# Patient Record
Sex: Male | Born: 1955
Health system: Southern US, Community
[De-identification: ages and names within clinical notes are randomized; demographics above are authoritative.]

## PROBLEM LIST (undated history)

## (undated) DIAGNOSIS — E785 Hyperlipidemia, unspecified: Secondary | ICD-10-CM

## (undated) DIAGNOSIS — I10 Essential (primary) hypertension: Secondary | ICD-10-CM

## (undated) DIAGNOSIS — G4700 Insomnia, unspecified: Secondary | ICD-10-CM

## (undated) DIAGNOSIS — K5900 Constipation, unspecified: Secondary | ICD-10-CM

## (undated) DIAGNOSIS — E119 Type 2 diabetes mellitus without complications: Secondary | ICD-10-CM

## (undated) DIAGNOSIS — K219 Gastro-esophageal reflux disease without esophagitis: Secondary | ICD-10-CM

## (undated) DIAGNOSIS — I480 Paroxysmal atrial fibrillation: Secondary | ICD-10-CM

## (undated) DIAGNOSIS — M1990 Unspecified osteoarthritis, unspecified site: Secondary | ICD-10-CM

## (undated) DIAGNOSIS — C801 Malignant (primary) neoplasm, unspecified: Secondary | ICD-10-CM

## (undated) DIAGNOSIS — I499 Cardiac arrhythmia, unspecified: Secondary | ICD-10-CM

## (undated) DIAGNOSIS — M109 Gout, unspecified: Secondary | ICD-10-CM

## (undated) HISTORY — DX: Malignant (primary) neoplasm, unspecified: C80.1

## (undated) HISTORY — DX: Unspecified osteoarthritis, unspecified site: M19.90

## (undated) HISTORY — PX: BACK SURGERY: SHX140

## (undated) HISTORY — PX: KNEE ARTHROSCOPY: SUR90

## (undated) HISTORY — DX: Gout, unspecified: M10.9

## (undated) HISTORY — PX: KNEE SURGERY: SHX244

## (undated) HISTORY — DX: Constipation, unspecified: K59.00

## (undated) HISTORY — DX: Insomnia, unspecified: G47.00

## (undated) HISTORY — PX: CARPAL TUNNEL RELEASE: SHX101

## (undated) HISTORY — DX: Hyperlipidemia, unspecified: E78.5

## (undated) HISTORY — DX: Paroxysmal atrial fibrillation: I48.0

## (undated) HISTORY — DX: Essential (primary) hypertension: I10

## (undated) HISTORY — DX: Gastro-esophageal reflux disease without esophagitis: K21.9

## (undated) HISTORY — DX: Type 2 diabetes mellitus without complications: E11.9

---

## 1999-03-03 ENCOUNTER — Emergency Department (HOSPITAL_COMMUNITY): Admission: EM | Admit: 1999-03-03 | Discharge: 1999-03-03 | Payer: Self-pay | Admitting: Emergency Medicine

## 1999-11-21 ENCOUNTER — Encounter: Payer: Self-pay | Admitting: Emergency Medicine

## 1999-11-21 ENCOUNTER — Emergency Department (HOSPITAL_COMMUNITY): Admission: EM | Admit: 1999-11-21 | Discharge: 1999-11-21 | Payer: Self-pay | Admitting: Emergency Medicine

## 2002-10-12 ENCOUNTER — Encounter: Admission: RE | Admit: 2002-10-12 | Discharge: 2002-10-12 | Payer: Self-pay | Admitting: Internal Medicine

## 2002-12-21 ENCOUNTER — Encounter: Admission: RE | Admit: 2002-12-21 | Discharge: 2002-12-21 | Payer: Self-pay | Admitting: Internal Medicine

## 2003-05-06 ENCOUNTER — Encounter: Admission: RE | Admit: 2003-05-06 | Discharge: 2003-05-06 | Payer: Self-pay | Admitting: Internal Medicine

## 2003-05-25 ENCOUNTER — Encounter: Admission: RE | Admit: 2003-05-25 | Discharge: 2003-05-25 | Payer: Self-pay | Admitting: Internal Medicine

## 2003-06-11 ENCOUNTER — Encounter: Admission: RE | Admit: 2003-06-11 | Discharge: 2003-06-11 | Payer: Self-pay | Admitting: Internal Medicine

## 2003-06-11 ENCOUNTER — Encounter: Payer: Self-pay | Admitting: Internal Medicine

## 2003-06-11 ENCOUNTER — Ambulatory Visit (HOSPITAL_COMMUNITY): Admission: RE | Admit: 2003-06-11 | Discharge: 2003-06-11 | Payer: Self-pay | Admitting: Internal Medicine

## 2003-07-12 ENCOUNTER — Encounter: Admission: RE | Admit: 2003-07-12 | Discharge: 2003-07-12 | Payer: Self-pay | Admitting: Infectious Diseases

## 2003-09-29 ENCOUNTER — Encounter: Admission: RE | Admit: 2003-09-29 | Discharge: 2003-09-29 | Payer: Self-pay | Admitting: Internal Medicine

## 2003-12-21 ENCOUNTER — Encounter: Admission: RE | Admit: 2003-12-21 | Discharge: 2003-12-21 | Payer: Self-pay | Admitting: Internal Medicine

## 2004-04-03 ENCOUNTER — Encounter: Admission: RE | Admit: 2004-04-03 | Discharge: 2004-04-03 | Payer: Self-pay | Admitting: Internal Medicine

## 2004-05-02 ENCOUNTER — Encounter: Admission: RE | Admit: 2004-05-02 | Discharge: 2004-05-02 | Payer: Self-pay | Admitting: Internal Medicine

## 2004-05-17 ENCOUNTER — Encounter: Admission: RE | Admit: 2004-05-17 | Discharge: 2004-05-17 | Payer: Self-pay | Admitting: Internal Medicine

## 2004-05-31 ENCOUNTER — Encounter: Admission: RE | Admit: 2004-05-31 | Discharge: 2004-05-31 | Payer: Self-pay | Admitting: Internal Medicine

## 2004-06-15 ENCOUNTER — Encounter: Admission: RE | Admit: 2004-06-15 | Discharge: 2004-06-15 | Payer: Self-pay | Admitting: Internal Medicine

## 2004-06-29 ENCOUNTER — Encounter: Admission: RE | Admit: 2004-06-29 | Discharge: 2004-06-29 | Payer: Self-pay | Admitting: Internal Medicine

## 2004-09-18 ENCOUNTER — Ambulatory Visit: Payer: Self-pay | Admitting: Internal Medicine

## 2004-10-04 ENCOUNTER — Ambulatory Visit: Payer: Self-pay | Admitting: Internal Medicine

## 2004-10-23 ENCOUNTER — Ambulatory Visit (HOSPITAL_COMMUNITY): Admission: RE | Admit: 2004-10-23 | Discharge: 2004-10-23 | Payer: Self-pay | Admitting: Internal Medicine

## 2004-10-23 ENCOUNTER — Ambulatory Visit: Payer: Self-pay | Admitting: Internal Medicine

## 2004-11-10 ENCOUNTER — Ambulatory Visit (HOSPITAL_COMMUNITY): Admission: RE | Admit: 2004-11-10 | Discharge: 2004-11-10 | Payer: Self-pay | Admitting: Internal Medicine

## 2004-11-10 ENCOUNTER — Ambulatory Visit: Payer: Self-pay | Admitting: Internal Medicine

## 2004-11-15 ENCOUNTER — Ambulatory Visit (HOSPITAL_COMMUNITY): Admission: RE | Admit: 2004-11-15 | Discharge: 2004-11-15 | Payer: Self-pay | Admitting: Internal Medicine

## 2004-12-19 ENCOUNTER — Ambulatory Visit: Payer: Self-pay | Admitting: Internal Medicine

## 2005-01-02 ENCOUNTER — Ambulatory Visit: Payer: Self-pay | Admitting: Internal Medicine

## 2005-06-27 ENCOUNTER — Ambulatory Visit: Payer: Self-pay | Admitting: Internal Medicine

## 2005-07-03 ENCOUNTER — Ambulatory Visit (HOSPITAL_COMMUNITY): Admission: RE | Admit: 2005-07-03 | Discharge: 2005-07-03 | Payer: Self-pay | Admitting: Internal Medicine

## 2005-11-15 ENCOUNTER — Ambulatory Visit: Payer: Self-pay | Admitting: Internal Medicine

## 2006-11-23 DIAGNOSIS — F17200 Nicotine dependence, unspecified, uncomplicated: Secondary | ICD-10-CM | POA: Insufficient documentation

## 2006-11-23 DIAGNOSIS — I10 Essential (primary) hypertension: Secondary | ICD-10-CM

## 2006-11-23 DIAGNOSIS — F1010 Alcohol abuse, uncomplicated: Secondary | ICD-10-CM | POA: Insufficient documentation

## 2006-11-23 DIAGNOSIS — M109 Gout, unspecified: Secondary | ICD-10-CM | POA: Insufficient documentation

## 2006-11-23 HISTORY — DX: Essential (primary) hypertension: I10

## 2006-12-04 DIAGNOSIS — R599 Enlarged lymph nodes, unspecified: Secondary | ICD-10-CM | POA: Insufficient documentation

## 2006-12-04 DIAGNOSIS — Z9889 Other specified postprocedural states: Secondary | ICD-10-CM | POA: Insufficient documentation

## 2006-12-04 DIAGNOSIS — E785 Hyperlipidemia, unspecified: Secondary | ICD-10-CM | POA: Insufficient documentation

## 2007-08-25 ENCOUNTER — Emergency Department (HOSPITAL_COMMUNITY): Admission: EM | Admit: 2007-08-25 | Discharge: 2007-08-26 | Payer: Self-pay | Admitting: Emergency Medicine

## 2008-04-02 ENCOUNTER — Ambulatory Visit (HOSPITAL_COMMUNITY): Admission: RE | Admit: 2008-04-02 | Discharge: 2008-04-02 | Payer: Self-pay | Admitting: Cardiology

## 2010-02-14 ENCOUNTER — Encounter: Admission: RE | Admit: 2010-02-14 | Discharge: 2010-02-14 | Payer: Self-pay | Admitting: Family Medicine

## 2011-04-06 ENCOUNTER — Emergency Department (HOSPITAL_COMMUNITY): Payer: Medicare Other

## 2011-04-06 ENCOUNTER — Emergency Department (HOSPITAL_COMMUNITY)
Admission: EM | Admit: 2011-04-06 | Discharge: 2011-04-06 | Disposition: A | Discharge status: Home / Self Care | Payer: Medicare Other | Attending: Emergency Medicine | Admitting: Emergency Medicine

## 2011-04-06 DIAGNOSIS — R6889 Other general symptoms and signs: Secondary | ICD-10-CM | POA: Insufficient documentation

## 2011-04-06 DIAGNOSIS — T783XXA Angioneurotic edema, initial encounter: Secondary | ICD-10-CM | POA: Insufficient documentation

## 2011-04-06 DIAGNOSIS — T46905A Adverse effect of unspecified agents primarily affecting the cardiovascular system, initial encounter: Secondary | ICD-10-CM | POA: Insufficient documentation

## 2011-04-06 DIAGNOSIS — I10 Essential (primary) hypertension: Secondary | ICD-10-CM | POA: Insufficient documentation

## 2011-04-06 DIAGNOSIS — R1310 Dysphagia, unspecified: Secondary | ICD-10-CM | POA: Insufficient documentation

## 2011-06-28 ENCOUNTER — Emergency Department (HOSPITAL_COMMUNITY)
Admission: EM | Admit: 2011-06-28 | Discharge: 2011-06-28 | Discharge status: Against Medical Advice | Payer: Medicare Other | Attending: Emergency Medicine | Admitting: Emergency Medicine

## 2011-06-28 ENCOUNTER — Emergency Department (HOSPITAL_COMMUNITY): Payer: Medicare Other

## 2011-06-28 DIAGNOSIS — S92919A Unspecified fracture of unspecified toe(s), initial encounter for closed fracture: Secondary | ICD-10-CM | POA: Insufficient documentation

## 2011-06-28 DIAGNOSIS — IMO0002 Reserved for concepts with insufficient information to code with codable children: Secondary | ICD-10-CM | POA: Insufficient documentation

## 2011-06-28 DIAGNOSIS — M25529 Pain in unspecified elbow: Secondary | ICD-10-CM | POA: Insufficient documentation

## 2011-06-28 DIAGNOSIS — M25569 Pain in unspecified knee: Secondary | ICD-10-CM | POA: Insufficient documentation

## 2011-06-28 DIAGNOSIS — I10 Essential (primary) hypertension: Secondary | ICD-10-CM | POA: Insufficient documentation

## 2011-06-28 DIAGNOSIS — E785 Hyperlipidemia, unspecified: Secondary | ICD-10-CM | POA: Insufficient documentation

## 2011-06-28 DIAGNOSIS — M549 Dorsalgia, unspecified: Secondary | ICD-10-CM | POA: Insufficient documentation

## 2011-06-28 DIAGNOSIS — M79609 Pain in unspecified limb: Secondary | ICD-10-CM | POA: Insufficient documentation

## 2011-10-17 ENCOUNTER — Ambulatory Visit: Payer: Medicare Other | Admitting: *Deleted

## 2012-02-19 DIAGNOSIS — E785 Hyperlipidemia, unspecified: Secondary | ICD-10-CM | POA: Diagnosis not present

## 2012-02-19 DIAGNOSIS — I10 Essential (primary) hypertension: Secondary | ICD-10-CM | POA: Diagnosis not present

## 2012-02-19 DIAGNOSIS — E119 Type 2 diabetes mellitus without complications: Secondary | ICD-10-CM | POA: Diagnosis not present

## 2012-04-07 DIAGNOSIS — Z79899 Other long term (current) drug therapy: Secondary | ICD-10-CM | POA: Diagnosis not present

## 2012-04-30 DIAGNOSIS — Z79899 Other long term (current) drug therapy: Secondary | ICD-10-CM | POA: Diagnosis not present

## 2012-07-18 DIAGNOSIS — E785 Hyperlipidemia, unspecified: Secondary | ICD-10-CM | POA: Diagnosis not present

## 2012-07-18 DIAGNOSIS — I10 Essential (primary) hypertension: Secondary | ICD-10-CM | POA: Diagnosis not present

## 2012-07-18 DIAGNOSIS — E119 Type 2 diabetes mellitus without complications: Secondary | ICD-10-CM | POA: Diagnosis not present

## 2012-08-21 DIAGNOSIS — E119 Type 2 diabetes mellitus without complications: Secondary | ICD-10-CM | POA: Diagnosis not present

## 2012-08-21 DIAGNOSIS — I10 Essential (primary) hypertension: Secondary | ICD-10-CM | POA: Diagnosis not present

## 2012-08-21 DIAGNOSIS — R634 Abnormal weight loss: Secondary | ICD-10-CM | POA: Diagnosis not present

## 2012-08-21 DIAGNOSIS — E785 Hyperlipidemia, unspecified: Secondary | ICD-10-CM | POA: Diagnosis not present

## 2012-09-19 DIAGNOSIS — R109 Unspecified abdominal pain: Secondary | ICD-10-CM | POA: Diagnosis not present

## 2012-09-19 DIAGNOSIS — Z23 Encounter for immunization: Secondary | ICD-10-CM | POA: Diagnosis not present

## 2012-09-19 DIAGNOSIS — E119 Type 2 diabetes mellitus without complications: Secondary | ICD-10-CM | POA: Diagnosis not present

## 2012-09-19 DIAGNOSIS — E785 Hyperlipidemia, unspecified: Secondary | ICD-10-CM | POA: Diagnosis not present

## 2012-09-22 DIAGNOSIS — Z800 Family history of malignant neoplasm of digestive organs: Secondary | ICD-10-CM | POA: Diagnosis not present

## 2012-09-22 DIAGNOSIS — K625 Hemorrhage of anus and rectum: Secondary | ICD-10-CM | POA: Diagnosis not present

## 2012-09-22 DIAGNOSIS — Z1211 Encounter for screening for malignant neoplasm of colon: Secondary | ICD-10-CM | POA: Diagnosis not present

## 2012-09-22 DIAGNOSIS — K5900 Constipation, unspecified: Secondary | ICD-10-CM | POA: Diagnosis not present

## 2012-10-02 DIAGNOSIS — D129 Benign neoplasm of anus and anal canal: Secondary | ICD-10-CM | POA: Diagnosis not present

## 2012-10-02 DIAGNOSIS — C184 Malignant neoplasm of transverse colon: Secondary | ICD-10-CM | POA: Diagnosis not present

## 2012-10-02 DIAGNOSIS — D126 Benign neoplasm of colon, unspecified: Secondary | ICD-10-CM | POA: Diagnosis not present

## 2012-10-02 DIAGNOSIS — D128 Benign neoplasm of rectum: Secondary | ICD-10-CM | POA: Diagnosis not present

## 2012-10-02 DIAGNOSIS — Z1211 Encounter for screening for malignant neoplasm of colon: Secondary | ICD-10-CM | POA: Diagnosis not present

## 2012-10-02 DIAGNOSIS — K649 Unspecified hemorrhoids: Secondary | ICD-10-CM | POA: Diagnosis not present

## 2012-10-03 DIAGNOSIS — C801 Malignant (primary) neoplasm, unspecified: Secondary | ICD-10-CM

## 2012-10-03 HISTORY — DX: Malignant (primary) neoplasm, unspecified: C80.1

## 2012-10-07 DIAGNOSIS — C189 Malignant neoplasm of colon, unspecified: Secondary | ICD-10-CM | POA: Diagnosis not present

## 2012-10-08 ENCOUNTER — Other Ambulatory Visit: Payer: Self-pay | Admitting: Gastroenterology

## 2012-10-08 DIAGNOSIS — C189 Malignant neoplasm of colon, unspecified: Secondary | ICD-10-CM

## 2012-10-13 ENCOUNTER — Ambulatory Visit
Admission: RE | Admit: 2012-10-13 | Discharge: 2012-10-13 | Disposition: A | Discharge status: Home / Self Care | Payer: Medicare Other | Source: Ambulatory Visit | Attending: Gastroenterology | Admitting: Gastroenterology

## 2012-10-13 DIAGNOSIS — R109 Unspecified abdominal pain: Secondary | ICD-10-CM | POA: Diagnosis not present

## 2012-10-13 DIAGNOSIS — C189 Malignant neoplasm of colon, unspecified: Secondary | ICD-10-CM

## 2012-10-13 DIAGNOSIS — K921 Melena: Secondary | ICD-10-CM | POA: Diagnosis not present

## 2012-10-13 DIAGNOSIS — K5900 Constipation, unspecified: Secondary | ICD-10-CM | POA: Diagnosis not present

## 2012-10-13 MED ORDER — IOHEXOL 300 MG/ML  SOLN
125.0000 mL | Freq: Once | INTRAMUSCULAR | Status: AC | PRN
  Administered 2012-10-13: 125 mL via INTRAVENOUS

## 2012-10-14 ENCOUNTER — Telehealth: Payer: Self-pay | Admitting: *Deleted

## 2012-10-14 NOTE — Telephone Encounter (Signed)
Received referral from Dr. Elnoria Howard.  Per Dr. Truett Perna, needs to see surgeon first.  Appointment set up with [facility], Dr. Maisie Fus, for 10/21/12.  This RN called and spoke with patient.  He confirmed this appointment and had no further questions.  This RN gave patient contact names and phone numbers for [facility] and [facility].  Dr. Truett Perna to follow patient and see after surgical consult.

## 2012-10-14 NOTE — Telephone Encounter (Signed)
Information from Dr.  Haywood Pao office was faxed to [facility].

## 2012-10-15 ENCOUNTER — Encounter (INDEPENDENT_AMBULATORY_CARE_PROVIDER_SITE_OTHER): Payer: Self-pay

## 2012-10-21 ENCOUNTER — Encounter (INDEPENDENT_AMBULATORY_CARE_PROVIDER_SITE_OTHER): Payer: Self-pay | Admitting: General Surgery

## 2012-10-21 ENCOUNTER — Telehealth: Payer: Self-pay | Admitting: Oncology

## 2012-10-21 ENCOUNTER — Ambulatory Visit (INDEPENDENT_AMBULATORY_CARE_PROVIDER_SITE_OTHER): Payer: Medicare Other | Admitting: General Surgery

## 2012-10-21 ENCOUNTER — Encounter (INDEPENDENT_AMBULATORY_CARE_PROVIDER_SITE_OTHER): Payer: Self-pay

## 2012-10-21 VITALS — BP 120/80 | HR 78 | Temp 97.1°F | Resp 18 | Ht 75.0 in | Wt 252.5 lb

## 2012-10-21 DIAGNOSIS — C189 Malignant neoplasm of colon, unspecified: Secondary | ICD-10-CM | POA: Insufficient documentation

## 2012-10-21 NOTE — Telephone Encounter (Signed)
S/W pt in re NP appt 11/21 @ 1:30 W/Dr. Truett Perna Referring-Dr. Elnoria Howard Dx- Colon CA Welcome packet given at registration.

## 2012-10-21 NOTE — Patient Instructions (Addendum)
Colorectal Cancer  Colorectal cancer is the second most common cancer in the [place], striking 140,000 people annually and causing 60,000 deaths. That's a staggering figure when you consider the disease is potentially curable if diagnosed in the early stages. Who is at risk? Though colorectal cancer may occur at any age, more than 90% of the patients are over age 54, at which point the risk doubles every ten years. In addition to age, other high risk factors include a family history of colorectal cancer and polyps and a personal history of ulcerative colitis, colon polyps or cancer of other organs, especially of the breast or uterus. How does it start? It is generally agreed that nearly all colon and rectal cancer begins in benign polyps. These pre-malignant growths occur on the bowel wall and may eventually increase in size and become cancer. Removal of benign polyps is one aspect of preventive medicine that really works! What are the symptoms? The most common symptoms are rectal bleeding and changes in bowel habits, such as constipation or diarrhea. (These symptoms are also common in other diseases so it is important you receive a thorough examination should you experience them.) Abdominal pain and weight loss are usually late symptoms indicating possible extensive disease. Unfortunately, many polyps and early cancers fail to produce symptoms. Therefore, it is important that your routine physical includes colorectal cancer detection procedures once you reach age 50.  There are several methods for detection of colorectal cancer. These include digital rectal examination, a chemical test of the stool for blood, flexible sigmoidoscopy and colonoscopy (lighted tubular instruments used to inspect the lower bowel) and barium enema. Be sure to discuss these options with your surgeon to determine which procedure is best for you. Individuals who have a first-degree relative (parent or sibling) with colon  cancer or polyps should start their colon cancer screening at the age of 54. How is colorectal cancer treated? Colorectal cancer requires surgery in nearly all cases for complete cure. Radiation and chemotherapy are sometimes used in addition to surgery. Between 80-90% are restored to normal health if the cancer is detected and treated in the earliest stages. The cure rate drops to 50% or less when diagnosed in the later stages. Thanks to modern technology, less than 5% of all colorectal cancer patients require a colostomy, the surgical construction of an artificial excretory opening from the colon. Can colon cancer be prevented? Colon cancer is very preventable. The most important step towards preventing colon cancer is getting a screening test. Any abnormal screening test should be followed by a colonoscopy. Some individuals prefer to start with colonoscopy as a screening test. Colonoscopy provides a detailed examination of the bowel. Polyps can be identified and can often be removed during colonoscopy. Though not definitely proven, there is some evidence that diet may play a significant role in preventing colorectal cancer. As far as we know, a high fiber, low fat diet is the only dietary measure that might help prevent colorectal cancer. Finally, pay attention to changes in your bowel habits. Any new changes such as persistent constipation, diarrhea, or blood in the stool should be discussed with your physician.   Can hemorrhoids lead to colon cancer? No, but hemorrhoids may produce symptoms similar to colon polyps or cancer. Should you experience these symptoms, you should have them examined and evaluated by a physician, preferably by a colon and rectal surgeon. What is a colon and rectal surgeon? Colon and rectal surgeons are experts in the surgical and non-surgical treatment   of diseases of the colon, rectum and anus. They have completed advanced surgical training in the treatment of these  diseases as well as full general surgical training. Board-certified colon and rectal surgeons complete residencies in general surgery and colon and rectal surgery, and pass intensive examinations conducted by the American Board of Surgery and the American Board of Colon and Rectal Surgery. They are well-versed in the treatment of both benign and malignant diseases of the colon, rectum and anus and are able to perform routine screening examinations and surgically treat conditions if indicated to do so.  2012 American Society of Colon & Rectal Surgeons  

## 2012-10-21 NOTE — Progress Notes (Signed)
Chief Complaint  Patient presents with  . Colon Cancer    HISTORY: Gary Frederick is a 56 y.o. male who presents to the office with a hepatic flexure mass.  This was found colonoscopy.  Workup thus far has included a CT scan of his abdomen.  He denies any weight loss or changes in appetite.  He does have some bright red blood per rectum.   He denies any chest pain or DOE.  He has a relatively active lifestyle.  He does smoke and he drinks 1-2 beers a day.  Past Medical History  Diagnosis Date  . Hyperlipidemia   . Hypertension   . Arthritis   . Gout   . Insomnia   . Constipation   . Cancer 10/2012    colon      Past Surgical History  Procedure Date  . Carpal tunnel release     right      Current Outpatient Prescriptions  Medication Sig Dispense Refill  . ALLOPURINOL PO Take 100 mg by mouth daily.       Marland Kitchen AMLODIPINE BESYLATE PO Take 10 mg by mouth daily.       . Ezetimibe (ZETIA PO) Take 10 mg by mouth daily.       Marland Kitchen LISINOPRIL PO Take 40 mg by mouth daily.       . Nebivolol HCl (BYSTOLIC PO) Take 10 mg by mouth daily.           No Known Allergies    family history strong h/o Huntington's disease, no h/o colon or rectal cancers  History   Social History  . Marital Status: Married    Spouse Name: N/A    Number of Children: N/A  . Years of Education: N/A   Social History Main Topics  . Smoking status: Current Every Day Smoker -- 1.0 packs/day for 30 years    Types: Cigarettes  . Smokeless tobacco: None  . Alcohol Use: 3.0 oz/week    5 Cans of beer per week  . Drug Use: No  . Sexually Active:    Other Topics Concern  . None   Social History Narrative  . None       REVIEW OF SYSTEMS - PERTINENT POSITIVES ONLY: Review of Systems - General ROS: negative for - chills or fever Hematological and Lymphatic ROS: negative for - bleeding problems or blood clots Respiratory ROS: no cough, shortness of breath, or wheezing Cardiovascular ROS: no chest pain or  dyspnea on exertion Gastrointestinal ROS: no abdominal pain, change in bowel habits, or black or bloody stools Genito-Urinary ROS: no dysuria, trouble voiding, or hematuria  EXAM: Filed Vitals:   10/21/12 1226  BP: 120/80  Pulse: 78  Temp: 97.1 F (36.2 C)  Resp: 18     Gen:  No acute distress.  Well nourished and well groomed.   Neurological: Alert and oriented to person, place, and time. Coordination normal.  Head: Normocephalic and atraumatic.  Eyes: Conjunctivae are normal. Pupils are equal, round, and reactive to light. No scleral icterus.  Neck: Normal range of motion. Neck supple. No tracheal deviation or thyromegaly present.  No cervical lymphadenopathy. Cardiovascular: Normal rate, regular rhythm, normal heart sounds and intact distal pulses.  Exam reveals no gallop and no friction rub.  No murmur heard. Respiratory: Effort normal.  No respiratory distress. No chest wall tenderness. Breath sounds normal.  No wheezes, rales or rhonchi.  GI: Soft. Bowel sounds are normal. The abdomen is soft and nontender.  There  is no rebound and no guarding.  Musculoskeletal: Normal range of motion. Extremities are nontender.  Skin: Skin is warm and dry. No rash noted. No diaphoresis. No erythema. No pallor. No clubbing, cyanosis, or edema.   Psychiatric: Normal mood and affect. Behavior is normal. Judgment and thought content normal.   LABORATORY RESULTS: Available labs are reviewed   CEA: Pending  RADIOLOGY RESULTS:   Images and reports are reviewed. IMPRESSION:  Suspected eccentric wall thickening along the inferior aspect of the proximal transverse colon, likely corresponding to the known ransverse colon cancer.  No pericolonic extension. No suspicious regional lymphadenopathy.  Possible 1.8 cm hypoenhancing lesion in the inferior right hepatic lobe, equivocal. Consider MRI abdomen with/without contrast for further evaluation.   ASSESSMENT AND PLAN: Gary Frederick is a 56  y.o. M who was referred to me for a new finding of a biopsy proven transverse colon cancer.  On CT it appears to be near the hepatic flexure.  It has been tattooed.  There is one indeterminate lesion in his liver on CT.  I have ordered a chest CT to complete his pre-op workup.  I will present him in GI tumor conference next Wednesday to discuss a plan for his liver lesion.  I will schedule him for a laparoscopic right hemicolectomy.    The surgery and anatomy were described to the patient as well as the risks of surgery and the possible complications.  These include: Bleeding, infection and possible wound complications such as hernia, damage to adjacent structures, leak of surgical connections, which can lead to other surgeries and possibly an ostomy (5-7%), possible need for other procedures, such as abscess drains in radiology, possible prolonged hospital stay, possible diarrhea from removal of part of the colon, possible constipation from narcotics, prolonged fatigue/weakness or appetite loss, possible early recurrence of cancer, possible complications of their medical problems such as heart disease or arrhythmias or lung problems, death (less than 1%).  I talked to him about increase risk of wound and cardiopulmonary complications with smoking.  I urged him to quit smoking or at least cut back prior to surgery. I believe the patient understands and wishes to proceed with the surgery.    Vanita Panda, MD Colon and Rectal Surgery / General Surgery [facility]      Visit Diagnoses: 1. Colon cancer     Primary Care Physician: Evlyn Courier, MD

## 2012-10-22 ENCOUNTER — Telehealth: Payer: Self-pay | Admitting: Oncology

## 2012-10-22 NOTE — Telephone Encounter (Signed)
C/D 10/22/12 for appt.10/23/12

## 2012-10-23 ENCOUNTER — Ambulatory Visit (HOSPITAL_BASED_OUTPATIENT_CLINIC_OR_DEPARTMENT_OTHER): Payer: Medicare Other | Admitting: Oncology

## 2012-10-23 ENCOUNTER — Encounter: Payer: Self-pay | Admitting: Oncology

## 2012-10-23 ENCOUNTER — Ambulatory Visit: Payer: Medicare Other

## 2012-10-23 ENCOUNTER — Telehealth: Payer: Self-pay | Admitting: Oncology

## 2012-10-23 VITALS — BP 123/73 | HR 64 | Temp 98.0°F | Resp 20 | Ht 75.0 in | Wt 255.0 lb

## 2012-10-23 DIAGNOSIS — K7689 Other specified diseases of liver: Secondary | ICD-10-CM

## 2012-10-23 DIAGNOSIS — C189 Malignant neoplasm of colon, unspecified: Secondary | ICD-10-CM

## 2012-10-23 DIAGNOSIS — C184 Malignant neoplasm of transverse colon: Secondary | ICD-10-CM | POA: Diagnosis not present

## 2012-10-23 NOTE — Telephone Encounter (Signed)
Gave pt appt for January 2014 lab and MD °

## 2012-10-23 NOTE — Progress Notes (Signed)
Checked in new pt with no financial concerns. °

## 2012-10-23 NOTE — Progress Notes (Signed)
[facility] New Patient Consult   Referring MD: Gary Frederick 56 y.o.  02-02-1956    Reason for Referral: Colon cancer     HPI: Gary Frederick saw Dr. Elnoria Howard up for a screening colonoscopy on 10/02/2012. Multiple polyps were identified throughout the colon with the exception of the sigmoid colon and rectum. The polyps were removed with a combination of cold and hot snare. A 3 cm mass was noted in the transverse colon. The mass was biopsied and the site was tattooed. The pathology revealed tubular adenomas and benign polypoid colonic mucosa from the cecum biopsies. A biopsy from the a sending colon revealed fragments of a tubular adenoma. The biopsy of the transverse colon mass revealed invasive adenocarcinoma, moderately differentiated. Immunohistochemical stains for mismatch repair genes revealed no evidence of DNA mismatch repair deficiency.  Gary Frederick feels well. Gary Frederick had abdominal pain when taking Lovaza for hypercholesterolemia. Abdominal pain and constipation resolved 20 stopped this medication. Gary Frederick has noted intermittent blood in the stool for the past year.  A CT of the abdomen and pelvis on 10/13/2012 revealed clear lung bases, a 1.8 cm hypoenhancing lesion in the inferior right hepatic lobe was indeterminate, asymmetric wall thickening at the inferior aspect of the proximal transverse colon-no suspicious regional lymphadenopathy. Nonspecific lymphatic stranding in the jejunal mesentery with small nodes measuring up to 7 mm.  Past medical history: 1. Hypertension 2. Hyperlipidemia 3. Gout  Past Surgical History  Procedure Date  . Carpal tunnel release     right    Family history: Gary Frederick had 7 brothers. No family history of cancer. His father and 5 brothers died of Huntington's disease. No children.   Current outpatient prescriptions:ALLOPURINOL PO, Take 100 mg by mouth daily. , Disp: , Rfl: ;  AMLODIPINE BESYLATE PO, Take 10 mg by mouth daily. , Disp: , Rfl: ;   Ezetimibe (ZETIA PO), Take 10 mg by mouth daily. , Disp: , Rfl: ;  LISINOPRIL PO, Take 40 mg by mouth daily. , Disp: , Rfl: ;  Nebivolol HCl (BYSTOLIC PO), Take 10 mg by mouth daily. , Disp: , Rfl:   Allergies: No Known Allergies  Social History: Gary Frederick lives in [place]. 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 Gary Frederick smokes one pack of cigarettes per day. Gary Frederick drinks beer daily and has been a heavy drinker in the past. Gary Frederick quit drinking liquor. No transfusion history. Gary Frederick denies risk factors for HIV and hepatitis. 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   ROS:   Positives include: Head sweats at night, occasional cough, rash over the feet and thighs when taking lovaza-improved, cyst at the occiput and right neck-a similar cyst of the left neck has resolved  A complete ROS was otherwise negative.  Physical Exam:  Blood pressure 123/73, pulse 64, temperature 98 F (36.7 C), temperature source Oral, resp. rate 20, height 6\' 3"  (1.905 m), weight 255 lb (115.667 kg).  HEENT: Oropharynx without visible mass, neck without mass Lungs: Clear bilaterally Cardiac: Regular rate and rhythm Abdomen: No hepatomegaly, nontender, no mass GU: Testes without mass  Vascular: No leg edema Lymph nodes: No cervical, supra-clavicular, axillary, or inguinal nodes Neurologic: Alert and oriented, the motor exam appears intact in the upper and lower Jevity's Skin: 1 cm cutaneous nodular lesion at the right scalene area with a similar lesion at the right occiput. Firm 3 cm round cutaneous lesion at the right  elbow.   LAB:  CEA on 10/07/2012-8.2  Radiology: As per history of present illness    Assessment/Plan:   1. Adenocarcinoma transverse colon 2. Indeterminate right liver lesion on a CT 10/13/2012 3. Mildly elevated CEA 4. Gout 5. Cutaneous nodular lesions-likely sebaceous cysts   Disposition:   Gary Frederick has been diagnosed with adenocarcinoma of the transverse  colon. Gary Frederick saw Dr. Maisie Fus and is being scheduled for surgery. His case will be presented at the GI tumor conference on 10/29/2012. We will review the abdominal CT and recommend a liver MRI as indicated. Gary Frederick is scheduled for a staging chest CT 10/27/2012.  Gary Frederick will return for an office visit after surgery to discuss the indication for adjuvant therapy.  Gary Frederick 10/23/2012, 6:03 PM

## 2012-10-23 NOTE — Progress Notes (Signed)
Met with patient and wife, explained RN navigator role.  Provided patient with education on colon cancer along with cancer support service information and provider/staff contact numbers.  Patient wants to wait on a referral to the dietician until after surgery.  Will continue to follow for needs.

## 2012-10-27 ENCOUNTER — Ambulatory Visit
Admission: RE | Admit: 2012-10-27 | Discharge: 2012-10-27 | Disposition: A | Discharge status: Home / Self Care | Payer: Medicare Other | Source: Ambulatory Visit | Attending: General Surgery | Admitting: General Surgery

## 2012-10-27 DIAGNOSIS — R599 Enlarged lymph nodes, unspecified: Secondary | ICD-10-CM | POA: Diagnosis not present

## 2012-10-27 DIAGNOSIS — I2510 Atherosclerotic heart disease of native coronary artery without angina pectoris: Secondary | ICD-10-CM | POA: Diagnosis not present

## 2012-10-27 DIAGNOSIS — C189 Malignant neoplasm of colon, unspecified: Secondary | ICD-10-CM

## 2012-10-27 MED ORDER — IOHEXOL 300 MG/ML  SOLN
75.0000 mL | Freq: Once | INTRAMUSCULAR | Status: AC | PRN
  Administered 2012-10-27: 75 mL via INTRAVENOUS

## 2012-10-29 ENCOUNTER — Telehealth (INDEPENDENT_AMBULATORY_CARE_PROVIDER_SITE_OTHER): Payer: Self-pay | Admitting: General Surgery

## 2012-10-29 NOTE — Telephone Encounter (Signed)
Attempted to call patient and discuss results of cancer conference discussion.  We decided to forgo any additional testing and do intraop [place] and possible biopsy and subsequent liver wedge resection if biopsy shows cancer.  In order to do this we will need to do surgery on 1/8 so that Dr [name] will be available to assist.  Unfortunately, we cannot do this any sooner due the the holidays and Dr Arita Miss and my schedule.

## 2012-11-18 DIAGNOSIS — E119 Type 2 diabetes mellitus without complications: Secondary | ICD-10-CM | POA: Diagnosis not present

## 2012-11-18 DIAGNOSIS — E7800 Pure hypercholesterolemia, unspecified: Secondary | ICD-10-CM | POA: Diagnosis not present

## 2012-11-18 DIAGNOSIS — E782 Mixed hyperlipidemia: Secondary | ICD-10-CM | POA: Diagnosis not present

## 2012-11-18 DIAGNOSIS — I10 Essential (primary) hypertension: Secondary | ICD-10-CM | POA: Diagnosis not present

## 2012-11-28 ENCOUNTER — Encounter (HOSPITAL_COMMUNITY): Payer: Self-pay | Admitting: Pharmacy Technician

## 2012-12-04 ENCOUNTER — Telehealth (INDEPENDENT_AMBULATORY_CARE_PROVIDER_SITE_OTHER): Payer: Self-pay

## 2012-12-04 ENCOUNTER — Encounter (HOSPITAL_COMMUNITY): Payer: Self-pay

## 2012-12-04 ENCOUNTER — Encounter (HOSPITAL_COMMUNITY)
Admission: RE | Admit: 2012-12-04 | Discharge: 2012-12-04 | Disposition: A | Discharge status: Home / Self Care | Payer: Medicare Other | Source: Ambulatory Visit | Attending: General Surgery | Admitting: General Surgery

## 2012-12-04 DIAGNOSIS — R599 Enlarged lymph nodes, unspecified: Secondary | ICD-10-CM | POA: Diagnosis not present

## 2012-12-04 DIAGNOSIS — K769 Liver disease, unspecified: Secondary | ICD-10-CM | POA: Diagnosis not present

## 2012-12-04 DIAGNOSIS — K429 Umbilical hernia without obstruction or gangrene: Secondary | ICD-10-CM | POA: Diagnosis not present

## 2012-12-04 DIAGNOSIS — C182 Malignant neoplasm of ascending colon: Secondary | ICD-10-CM | POA: Diagnosis not present

## 2012-12-04 LAB — CBC
HCT: 45.8 % (ref 39.0–52.0)
MCHC: 34.7 g/dL (ref 30.0–36.0)
MCV: 90.9 fL (ref 78.0–100.0)
RDW: 13 % (ref 11.5–15.5)

## 2012-12-04 LAB — BASIC METABOLIC PANEL
BUN: 11 mg/dL (ref 6–23)
Creatinine, Ser: 0.99 mg/dL (ref 0.50–1.35)
GFR calc Af Amer: >90 mL/min (ref >90)
GFR calc non Af Amer: >90 mL/min (ref >90)

## 2012-12-04 LAB — CEA: CEA: 12.1 ng/mL — ABNORMAL HIGH (ref 0.0–5.0)

## 2012-12-04 NOTE — Progress Notes (Signed)
pts wife called at 1230 to inform me that the office said he does not need bowel prep

## 2012-12-04 NOTE — Progress Notes (Signed)
LOV Dr Elnoria Howard chart 10/13,   Chest CT  EPIC  11/13

## 2012-12-04 NOTE — Progress Notes (Signed)
Instructed to call office today to see if he needs bowel prep pre op.   Also has swollen third and fourth fingers at knuckle joint. States scrapped on sidewalk 3 weeks ago. Third finger has slight opening in skin with old scabbing noted- appears to be fluid around the knuckle about 1 cm, soft to palpation.  Instructed to see pcp or urgent care for evaluation- no red or hot, but is swollen.  Also verbalizes understanding of taking his bystolic and amolodipine earlier the day before surgery so that it can safely be taken before admission day of surgery

## 2012-12-04 NOTE — Progress Notes (Signed)
12/04/12 1132  OBSTRUCTIVE SLEEP APNEA  Have you ever been diagnosed with sleep apnea through a sleep study? No  Do you snore loudly (loud enough to be heard through closed doors)?  1  Do you often feel tired, fatigued, or sleepy during the daytime? 1  Has anyone observed you stop breathing during your sleep? 1  Do you have, or are you being treated for high blood pressure? 1  BMI more than 35 kg/m2? 0  Age over 55 years old? 1  Neck circumference greater than 40 cm/18 inches? 0  Gender: 1  Obstructive Sleep Apnea Score 6   Score 4 or greater  Results sent to PCP

## 2012-12-04 NOTE — Patient Instructions (Addendum)
22 Gary Frederick  12/04/2012   Your procedure is scheduled on:  12/11/12  Thursday                                                                              CALL DR Maisie Fus OFFICE TODAY TO ASK ABOUT BOWEL PREP  Report to [facility] at   0515    AM.  Call this number if you have problems the morning of surgery: [phone]       Remember: TAKE  BYSTOLIC AND AMLODIPINE EARLIER IN THE MORNING ON WEDNESDAY   ONLY -  AROUND 900 AM  Do not eat food  Or drink any fluids  after midnight Wednesday  night    Take these medicines the morning of surgery with A SIP OF WATER:   BYSTOLIC,  AMLODIPINE   .  Contacts, dentures or partial plates can not be worn to surgery  Leave suitcase in the car. After surgery it may be brought to your room.  For patients admitted to the hospital, checkout time is 11:00 AM day of  discharge.             SPECIAL INSTRUCTIONS- SEE [facility] PREPARING FOR SURGERY INSTRUCTION SHEET-     DO NOT WEAR JEWELRY, LOTIONS, POWDERS, OR PERFUMES.  WOMEN-- DO NOT SHAVE LEGS OR UNDERARMS FOR 12 HOURS BEFORE SHOWERS. MEN MAY SHAVE FACE.  Patients discharged the day of surgery will not be allowed to drive home. IF going home the day of surgery, you must have a driver and someone to stay with you for the first 24 hours  Name and phone number of your driver:  admission                                                                        Please read over the following fact sheets that you were given: MRSA Information, Incentive Spirometry Sheet, Blood Transfusion Sheet  Information                                                                                   [name]  PST 336  [phone]                 FAILURE TO FOLLOW THESE INSTRUCTIONS MAY RESULT IN  CANCELLATION   OF YOUR SURGERY                                                  Patient  Signature _____________________________

## 2012-12-04 NOTE — Telephone Encounter (Signed)
The pt's wife called to confirm that the pt does not need a bowel prep and I told her he does not.

## 2012-12-09 ENCOUNTER — Other Ambulatory Visit (INDEPENDENT_AMBULATORY_CARE_PROVIDER_SITE_OTHER): Payer: Self-pay | Admitting: General Surgery

## 2012-12-11 ENCOUNTER — Inpatient Hospital Stay (HOSPITAL_COMMUNITY)
Admission: RE | Admit: 2012-12-11 | Discharge: 2012-12-15 | DRG: 331 | Disposition: A | Discharge status: Home / Self Care | Payer: Medicare Other | Source: Ambulatory Visit | Attending: General Surgery | Admitting: General Surgery

## 2012-12-11 ENCOUNTER — Encounter (HOSPITAL_COMMUNITY): Payer: Self-pay | Admitting: *Deleted

## 2012-12-11 ENCOUNTER — Encounter (HOSPITAL_COMMUNITY): Payer: Self-pay | Admitting: Anesthesiology

## 2012-12-11 ENCOUNTER — Encounter (HOSPITAL_COMMUNITY)
Admission: RE | Disposition: A | Discharge status: Home / Self Care | Payer: Self-pay | Source: Ambulatory Visit | Attending: General Surgery

## 2012-12-11 ENCOUNTER — Inpatient Hospital Stay (HOSPITAL_COMMUNITY): Payer: Medicare Other | Admitting: Anesthesiology

## 2012-12-11 DIAGNOSIS — K769 Liver disease, unspecified: Secondary | ICD-10-CM | POA: Diagnosis present

## 2012-12-11 DIAGNOSIS — F1010 Alcohol abuse, uncomplicated: Secondary | ICD-10-CM | POA: Diagnosis present

## 2012-12-11 DIAGNOSIS — R599 Enlarged lymph nodes, unspecified: Secondary | ICD-10-CM | POA: Diagnosis not present

## 2012-12-11 DIAGNOSIS — K7200 Acute and subacute hepatic failure without coma: Secondary | ICD-10-CM | POA: Diagnosis not present

## 2012-12-11 DIAGNOSIS — Z79899 Other long term (current) drug therapy: Secondary | ICD-10-CM

## 2012-12-11 DIAGNOSIS — Z01812 Encounter for preprocedural laboratory examination: Secondary | ICD-10-CM

## 2012-12-11 DIAGNOSIS — K429 Umbilical hernia without obstruction or gangrene: Secondary | ICD-10-CM | POA: Diagnosis not present

## 2012-12-11 DIAGNOSIS — E876 Hypokalemia: Secondary | ICD-10-CM | POA: Diagnosis not present

## 2012-12-11 DIAGNOSIS — C182 Malignant neoplasm of ascending colon: Secondary | ICD-10-CM | POA: Diagnosis not present

## 2012-12-11 DIAGNOSIS — C189 Malignant neoplasm of colon, unspecified: Secondary | ICD-10-CM | POA: Diagnosis present

## 2012-12-11 DIAGNOSIS — I10 Essential (primary) hypertension: Secondary | ICD-10-CM | POA: Diagnosis present

## 2012-12-11 DIAGNOSIS — E785 Hyperlipidemia, unspecified: Secondary | ICD-10-CM | POA: Diagnosis present

## 2012-12-11 DIAGNOSIS — F17200 Nicotine dependence, unspecified, uncomplicated: Secondary | ICD-10-CM | POA: Diagnosis present

## 2012-12-11 HISTORY — PX: LIVER ULTRASOUND: SHX5952

## 2012-12-11 HISTORY — PX: COLON RESECTION: SHX5231

## 2012-12-11 HISTORY — PX: COLON SURGERY: SHX602

## 2012-12-11 SURGERY — LAPAROSCOPIC RIGHT COLON RESECTION
Anesthesia: General | Site: Abdomen | Laterality: Right | Wound class: Clean

## 2012-12-11 MED ORDER — PHENYLEPHRINE HCL 10 MG/ML IJ SOLN
10.0000 mg | INTRAVENOUS | Status: DC | PRN
  Administered 2012-12-11: 5 ug/min via INTRAVENOUS

## 2012-12-11 MED ORDER — NAPHAZOLINE-GLYCERIN 0.012-0.2 % OP SOLN: 1.0000 [drp] | OPHTHALMIC | Status: DC | PRN

## 2012-12-11 MED ORDER — LACTATED RINGERS IR SOLN
Status: DC | PRN
  Administered 2012-12-11: 1000 mL

## 2012-12-11 MED ORDER — ONDANSETRON HCL 4 MG/2ML IJ SOLN: 4.0000 mg | Freq: Four times a day (QID) | INTRAMUSCULAR | Status: DC | PRN

## 2012-12-11 MED ORDER — EPHEDRINE SULFATE 50 MG/ML IJ SOLN
INTRAMUSCULAR | Status: DC | PRN
  Administered 2012-12-11 (×2): 5 mg via INTRAVENOUS

## 2012-12-11 MED ORDER — ALLOPURINOL 100 MG PO TABS
100.0000 mg | ORAL_TABLET | Freq: Every day | ORAL | Status: DC
  Administered 2012-12-12: 100 mg via ORAL
  Filled 2012-12-11 (×2): qty 1

## 2012-12-11 MED ORDER — ACETAMINOPHEN 10 MG/ML IV SOLN
INTRAVENOUS | Status: DC | PRN
  Administered 2012-12-11: 1000 mg via INTRAVENOUS

## 2012-12-11 MED ORDER — HYDROMORPHONE HCL PF 1 MG/ML IJ SOLN
0.2500 mg | INTRAMUSCULAR | Status: DC | PRN
  Administered 2012-12-11 (×2): 0.5 mg via INTRAVENOUS

## 2012-12-11 MED ORDER — NEOSTIGMINE METHYLSULFATE 1 MG/ML IJ SOLN
INTRAMUSCULAR | Status: DC | PRN
  Administered 2012-12-11: 3 mg via INTRAVENOUS

## 2012-12-11 MED ORDER — HYDROMORPHONE HCL PF 1 MG/ML IJ SOLN
INTRAMUSCULAR | Status: DC | PRN
  Administered 2012-12-11 (×2): 1 mg via INTRAVENOUS

## 2012-12-11 MED ORDER — PROPOFOL 10 MG/ML IV BOLUS
INTRAVENOUS | Status: DC | PRN
  Administered 2012-12-11: 250 mg via INTRAVENOUS

## 2012-12-11 MED ORDER — DIPHENHYDRAMINE HCL 25 MG PO CAPS: 25.0000 mg | ORAL_CAPSULE | Freq: Four times a day (QID) | ORAL | Status: DC | PRN

## 2012-12-11 MED ORDER — SUCCINYLCHOLINE CHLORIDE 20 MG/ML IJ SOLN
INTRAMUSCULAR | Status: DC | PRN
  Administered 2012-12-11: 180 mg via INTRAVENOUS

## 2012-12-11 MED ORDER — AMLODIPINE BESYLATE 10 MG PO TABS
10.0000 mg | ORAL_TABLET | Freq: Every day | ORAL | Status: DC
  Administered 2012-12-12 – 2012-12-14 (×3): 10 mg via ORAL
  Filled 2012-12-11 (×4): qty 1

## 2012-12-11 MED ORDER — ONDANSETRON HCL 4 MG PO TABS: 4.0000 mg | ORAL_TABLET | Freq: Four times a day (QID) | ORAL | Status: DC | PRN

## 2012-12-11 MED ORDER — FENTANYL CITRATE 0.05 MG/ML IJ SOLN
INTRAMUSCULAR | Status: DC | PRN
  Administered 2012-12-11 (×2): 100 ug via INTRAVENOUS
  Administered 2012-12-11: 50 ug via INTRAVENOUS

## 2012-12-11 MED ORDER — LIDOCAINE HCL (CARDIAC) 20 MG/ML IV SOLN
INTRAVENOUS | Status: DC | PRN
  Administered 2012-12-11: 100 mg via INTRAVENOUS

## 2012-12-11 MED ORDER — DEXTROSE 5 % IV SOLN
2.0000 g | INTRAVENOUS | Status: AC
  Administered 2012-12-11: 2 g via INTRAVENOUS

## 2012-12-11 MED ORDER — 0.9 % SODIUM CHLORIDE (POUR BTL) OPTIME
TOPICAL | Status: DC | PRN
  Administered 2012-12-11: 2000 mL

## 2012-12-11 MED ORDER — NEBIVOLOL HCL 10 MG PO TABS
10.0000 mg | ORAL_TABLET | Freq: Every day | ORAL | Status: DC
  Administered 2012-12-12 – 2012-12-14 (×3): 10 mg via ORAL
  Filled 2012-12-11 (×5): qty 1

## 2012-12-11 MED ORDER — ALVIMOPAN 12 MG PO CAPS
12.0000 mg | ORAL_CAPSULE | Freq: Once | ORAL | Status: AC
  Administered 2012-12-11: 12 mg via ORAL
  Filled 2012-12-11: qty 1

## 2012-12-11 MED ORDER — DIPHENHYDRAMINE HCL 50 MG/ML IJ SOLN: 25.0000 mg | Freq: Four times a day (QID) | INTRAMUSCULAR | Status: DC | PRN

## 2012-12-11 MED ORDER — NALOXONE HCL 0.4 MG/ML IJ SOLN: 0.4000 mg | INTRAMUSCULAR | Status: DC | PRN

## 2012-12-11 MED ORDER — PROMETHAZINE HCL 25 MG/ML IJ SOLN: 6.2500 mg | INTRAMUSCULAR | Status: DC | PRN

## 2012-12-11 MED ORDER — LISINOPRIL 40 MG PO TABS
40.0000 mg | ORAL_TABLET | Freq: Every day | ORAL | Status: DC
  Administered 2012-12-12 – 2012-12-14 (×3): 40 mg via ORAL
  Filled 2012-12-11 (×5): qty 1

## 2012-12-11 MED ORDER — ALVIMOPAN 12 MG PO CAPS
12.0000 mg | ORAL_CAPSULE | Freq: Two times a day (BID) | ORAL | Status: DC
  Administered 2012-12-12 – 2012-12-13 (×3): 12 mg via ORAL
  Filled 2012-12-11 (×9): qty 1

## 2012-12-11 MED ORDER — SODIUM CHLORIDE 0.9 % IV SOLN
INTRAVENOUS | Status: DC
  Administered 2012-12-11: 100 mL/h via INTRAVENOUS
  Administered 2012-12-12 – 2012-12-13 (×3): via INTRAVENOUS

## 2012-12-11 MED ORDER — SODIUM CHLORIDE 0.9 % IJ SOLN: 9.0000 mL | INTRAMUSCULAR | Status: DC | PRN

## 2012-12-11 MED ORDER — KETOROLAC TROMETHAMINE 30 MG/ML IJ SOLN: 15.0000 mg | Freq: Once | INTRAMUSCULAR | Status: DC | PRN

## 2012-12-11 MED ORDER — ENOXAPARIN SODIUM 40 MG/0.4ML ~~LOC~~ SOLN
40.0000 mg | SUBCUTANEOUS | Status: DC
  Administered 2012-12-12 – 2012-12-15 (×4): 40 mg via SUBCUTANEOUS
  Filled 2012-12-11 (×6): qty 0.4

## 2012-12-11 MED ORDER — GLYCOPYRROLATE 0.2 MG/ML IJ SOLN
INTRAMUSCULAR | Status: DC | PRN
  Administered 2012-12-11: 0.4 mg via INTRAVENOUS
  Administered 2012-12-11: 0.2 mg via INTRAVENOUS

## 2012-12-11 MED ORDER — LACTATED RINGERS IV SOLN
INTRAVENOUS | Status: DC | PRN
  Administered 2012-12-11 (×4): via INTRAVENOUS

## 2012-12-11 MED ORDER — NAPHAZOLINE-PHENIRAMINE 0.025-0.3 % OP SOLN
1.0000 [drp] | Freq: Four times a day (QID) | OPHTHALMIC | Status: DC | PRN
  Filled 2012-12-11: qty 15

## 2012-12-11 MED ORDER — ROCURONIUM BROMIDE 100 MG/10ML IV SOLN
INTRAVENOUS | Status: DC | PRN
  Administered 2012-12-11: 50 mg via INTRAVENOUS
  Administered 2012-12-11: 20 mg via INTRAVENOUS
  Administered 2012-12-11 (×3): 10 mg via INTRAVENOUS
  Administered 2012-12-11: 20 mg via INTRAVENOUS
  Administered 2012-12-11: 10 mg via INTRAVENOUS

## 2012-12-11 MED ORDER — EZETIMIBE 10 MG PO TABS
10.0000 mg | ORAL_TABLET | Freq: Every day | ORAL | Status: DC
  Filled 2012-12-11 (×5): qty 1

## 2012-12-11 MED ORDER — MORPHINE SULFATE (PF) 1 MG/ML IV SOLN
INTRAVENOUS | Status: DC
  Administered 2012-12-11: 5.77 mg via INTRAVENOUS
  Administered 2012-12-11: 21:00:00 via INTRAVENOUS
  Administered 2012-12-11: 19.5 mg via INTRAVENOUS
  Administered 2012-12-11: 1.5 mg via INTRAVENOUS
  Administered 2012-12-11: 12:00:00 via INTRAVENOUS
  Administered 2012-12-11: 15 mg via INTRAVENOUS
  Administered 2012-12-11: 6 mg via INTRAVENOUS
  Administered 2012-12-11 (×2): 1.5 mg via INTRAVENOUS
  Administered 2012-12-12: 7.5 mg via INTRAVENOUS
  Administered 2012-12-12: 4.5 mg via INTRAVENOUS
  Administered 2012-12-12: 11:00:00 via INTRAVENOUS
  Administered 2012-12-12: 11.5 mg via INTRAVENOUS
  Administered 2012-12-12: 9 mg via INTRAVENOUS
  Administered 2012-12-13 – 2012-12-14 (×3): 1.5 mg via INTRAVENOUS
  Filled 2012-12-11 (×3): qty 25

## 2012-12-11 MED ORDER — OXYCODONE-ACETAMINOPHEN 5-325 MG PO TABS
1.0000 | ORAL_TABLET | ORAL | Status: DC | PRN
  Administered 2012-12-14 – 2012-12-15 (×3): 2 via ORAL
  Filled 2012-12-11 (×4): qty 2

## 2012-12-11 MED ORDER — ONDANSETRON HCL 4 MG/2ML IJ SOLN
INTRAMUSCULAR | Status: DC | PRN
  Administered 2012-12-11 (×2): 2 mg via INTRAVENOUS

## 2012-12-11 MED ORDER — MIDAZOLAM HCL 5 MG/5ML IJ SOLN
INTRAMUSCULAR | Status: DC | PRN
  Administered 2012-12-11: 2 mg via INTRAVENOUS

## 2012-12-11 SURGICAL SUPPLY — 97 items
ADH SKN CLS APL DERMABOND .7 (GAUZE/BANDAGES/DRESSINGS) ×2
APPLIER CLIP 5 13 M/L LIGAMAX5 (MISCELLANEOUS)
APPLIER CLIP ROT 10 11.4 M/L (STAPLE)
APR CLP MED LRG 11.4X10 (STAPLE)
APR CLP MED LRG 5 ANG JAW (MISCELLANEOUS)
BAG SPEC RTRVL LRG 6X4 10 (ENDOMECHANICALS)
BLADE EXTENDED COATED 6.5IN (ELECTRODE) ×3 IMPLANT
BLADE HEX COATED 2.75 (ELECTRODE) ×4 IMPLANT
BLADE SURG SZ10 CARB STEEL (BLADE) ×2 IMPLANT
CANISTER SUCTION 2500CC (MISCELLANEOUS) ×8 IMPLANT
CANNULA ENDOPATH XCEL 11M (ENDOMECHANICALS) IMPLANT
CELLS DAT CNTRL 66122 CELL SVR (MISCELLANEOUS) ×2 IMPLANT
CHLORAPREP W/TINT 26ML (MISCELLANEOUS) ×6 IMPLANT
CLIP APPLIE 5 13 M/L LIGAMAX5 (MISCELLANEOUS) IMPLANT
CLIP APPLIE ROT 10 11.4 M/L (STAPLE) IMPLANT
CLIP TI LARGE 6 (CLIP) IMPLANT
CLOTH BEACON ORANGE TIMEOUT ST (SAFETY) ×4 IMPLANT
COVER MAYO STAND STRL (DRAPES) ×4 IMPLANT
COVER SURGICAL LIGHT HANDLE (MISCELLANEOUS) ×3 IMPLANT
DECANTER SPIKE VIAL GLASS SM (MISCELLANEOUS) ×1 IMPLANT
DERMABOND ADVANCED (GAUZE/BANDAGES/DRESSINGS) ×1
DERMABOND ADVANCED .7 DNX12 (GAUZE/BANDAGES/DRESSINGS) ×1 IMPLANT
DRAPE LAPAROSCOPIC ABDOMINAL (DRAPES) ×6 IMPLANT
DRAPE LG THREE QUARTER DISP (DRAPES) IMPLANT
DRAPE WARM FLUID 44X44 (DRAPE) ×8 IMPLANT
ELECT REM PT RETURN 9FT ADLT (ELECTROSURGICAL) ×3
ELECTRODE REM PT RTRN 9FT ADLT (ELECTROSURGICAL) ×3 IMPLANT
FILTER SMOKE EVAC LAPAROSHD (FILTER) IMPLANT
GLOVE BIO SURGEON STRL SZ7.5 (GLOVE) ×6 IMPLANT
GLOVE BIOGEL M 7.0 STRL (GLOVE) ×6 IMPLANT
GLOVE BIOGEL M STRL SZ7.5 (GLOVE) IMPLANT
GLOVE BIOGEL PI IND STRL 7.0 (GLOVE) ×4 IMPLANT
GLOVE BIOGEL PI INDICATOR 7.0 (GLOVE) ×2
GLOVE INDICATOR 8.0 STRL GRN (GLOVE) ×6 IMPLANT
GOWN PREVENTION PLUS LG XLONG (DISPOSABLE) ×6 IMPLANT
GOWN PREVENTION PLUS XLARGE (GOWN DISPOSABLE) ×4 IMPLANT
GOWN STRL NON-REIN LRG LVL3 (GOWN DISPOSABLE) ×4 IMPLANT
GOWN STRL REIN XL XLG (GOWN DISPOSABLE) ×4 IMPLANT
KIT BASIN OR (CUSTOM PROCEDURE TRAY) ×6 IMPLANT
LEGGING LITHOTOMY PAIR STRL (DRAPES) IMPLANT
LIGASURE IMPACT 36 18CM CVD LR (INSTRUMENTS) IMPLANT
NDL BIOPSY 14GX4.5 SOFT TIS (NEEDLE) IMPLANT
NEEDLE BIOPSY 14GX4.5 SOFT TIS (NEEDLE) ×3 IMPLANT
NS IRRIG 1000ML POUR BTL (IV SOLUTION) ×9 IMPLANT
PACK GENERAL/GYN (CUSTOM PROCEDURE TRAY) ×3 IMPLANT
PENCIL BUTTON HOLSTER BLD 10FT (ELECTRODE) ×1 IMPLANT
POUCH SPECIMEN RETRIEVAL 10MM (ENDOMECHANICALS) IMPLANT
RELOAD PROXIMATE 75MM BLUE (ENDOMECHANICALS) ×3 IMPLANT
RELOAD STAPLE 75 3.8 BLU REG (ENDOMECHANICALS) IMPLANT
RETRACTOR WND ALEXIS 18 MED (MISCELLANEOUS) IMPLANT
RTRCTR WOUND ALEXIS 18CM MED (MISCELLANEOUS) ×3
SCALPEL HARMONIC ACE (MISCELLANEOUS) IMPLANT
SEALER TISSUE G2 CVD JAW 35 (ENDOMECHANICALS) ×1 IMPLANT
SEALER TISSUE G2 CVD JAW 45CM (ENDOMECHANICALS) ×1
SET IRRIG TUBING LAPAROSCOPIC (IRRIGATION / IRRIGATOR) IMPLANT
SHEARS FOC LG CVD HARMONIC 17C (MISCELLANEOUS) IMPLANT
SLEEVE XCEL OPT CAN 5 100 (ENDOMECHANICALS) ×2 IMPLANT
SOLUTION ANTI FOG 6CC (MISCELLANEOUS) ×3 IMPLANT
SPONGE GAUZE 4X4 12PLY (GAUZE/BANDAGES/DRESSINGS) ×6 IMPLANT
SPONGE LAP 18X18 X RAY DECT (DISPOSABLE) ×4 IMPLANT
STAPLER GUN LINEAR PROX 60 (STAPLE) ×2 IMPLANT
STAPLER PROXIMATE 75MM BLUE (STAPLE) ×2 IMPLANT
STAPLER VISISTAT 35W (STAPLE) ×3 IMPLANT
STRIP CLOSURE SKIN 1/2X4 (GAUZE/BANDAGES/DRESSINGS) IMPLANT
SUCTION POOLE TIP (SUCTIONS) ×6 IMPLANT
SUT PDS AB 1 CT1 27 (SUTURE) ×10 IMPLANT
SUT PDS AB 1 CTX 36 (SUTURE) IMPLANT
SUT PDS AB 1 TP1 96 (SUTURE) IMPLANT
SUT PDS AB 3-0 CT2 27 (SUTURE) IMPLANT
SUT PDS AB 3-0 SH 27 (SUTURE) ×1 IMPLANT
SUT PDS AB 4-0 SH 27 (SUTURE) IMPLANT
SUT PROLENE 2 0 BLUE (SUTURE) IMPLANT
SUT PROLENE 2 0 KS (SUTURE) IMPLANT
SUT PROLENE 2 0 SH DA (SUTURE) IMPLANT
SUT SILK 2 0 (SUTURE) ×3
SUT SILK 2 0 SH CR/8 (SUTURE) ×4 IMPLANT
SUT SILK 2 0SH CR/8 30 (SUTURE) IMPLANT
SUT SILK 2-0 18XBRD TIE 12 (SUTURE) ×4 IMPLANT
SUT SILK 2-0 30XBRD TIE 12 (SUTURE) IMPLANT
SUT SILK 3 0 (SUTURE) ×3
SUT SILK 3 0 SH CR/8 (SUTURE) ×4 IMPLANT
SUT SILK 3-0 18XBRD TIE 12 (SUTURE) ×3 IMPLANT
SUT VIC AB 4-0 PS2 27 (SUTURE) ×2 IMPLANT
SUT VICRYL 0 UR6 27IN ABS (SUTURE) ×2 IMPLANT
SYR BULB IRRIGATION 50ML (SYRINGE) ×3 IMPLANT
SYS LAPSCP GELPORT 120MM (MISCELLANEOUS)
SYSTEM LAPSCP GELPORT 120MM (MISCELLANEOUS) IMPLANT
TOWEL OR 17X26 10 PK STRL BLUE (TOWEL DISPOSABLE) ×6 IMPLANT
TRAY FOLEY CATH 14FRSI W/METER (CATHETERS) ×6 IMPLANT
TRAY LAP CHOLE (CUSTOM PROCEDURE TRAY) ×3 IMPLANT
TROCAR BLADELESS OPT 5 75 (ENDOMECHANICALS) ×7 IMPLANT
TROCAR XCEL 12X100 BLDLESS (ENDOMECHANICALS) IMPLANT
TROCAR XCEL BLUNT TIP 100MML (ENDOMECHANICALS) ×2 IMPLANT
TROCAR XCEL NON-BLD 11X100MML (ENDOMECHANICALS) IMPLANT
TUBING FILTER THERMOFLATOR (ELECTROSURGICAL) ×3 IMPLANT
YANKAUER SUCT BULB TIP 10FT TU (MISCELLANEOUS) ×3 IMPLANT
YANKAUER SUCT BULB TIP NO VENT (SUCTIONS) ×6 IMPLANT

## 2012-12-11 NOTE — Progress Notes (Signed)
Patient had no diet ordered, discussed with [facility], will keep NPO and hold meds until morning.

## 2012-12-11 NOTE — Op Note (Signed)
12/11/2012  12:39 PM  PATIENT:  Gary Frederick  57 y.o. male  Patient Care Team: Mirna Mires, MD as PCP - General (Family Medicine) Romie Levee, MD as Attending Physician (General Surgery) Theda Belfast, MD as Attending Physician (Gastroenterology)  PRE-OPERATIVE DIAGNOSIS:  colon cancer  POST-OPERATIVE DIAGNOSIS:  COLON CANCER  PROCEDURE:  Procedure(s): Intraoperative LIVER ULTRASOUND and liver biopsy LAPAROSCOPIC EXTENDED RIGHT COLON RESECTION Hepatic Flexure Mobilization Umbilical hernia repiar  SURGEON:  Surgeon(s): Romie Levee, MD Almond Lint, MD Wilmon Arms. Corliss Skains, MD  ASSISTANT: Dr Corliss Skains   ANESTHESIA:   general    Total I/O In: 3900 [I.V.:3900] Out: 500 [Urine:300; Blood:200]  Delay start of Pharmacological VTE agent (>24hrs) due to surgical blood loss or risk of bleeding:  no  DRAINS: none   SPECIMEN:  Source of Specimen:  R colon, Liver biopsy  DISPOSITION OF SPECIMEN:  PATHOLOGY  COUNTS:  YES  PLAN OF CARE: Admit to inpatient   PATIENT DISPOSITION:  PACU - hemodynamically stable.  INDICATION: This is a 57 yo male with a biopsy proven colon cancer.  There was also a liver mass noted on CT.    OR FINDINGS: Liver mass near gallbladder: non-diagnostic on frozen pathology.  Transverse colon mass.  Small umbilical hernia  DESCRIPTION: The patient was identified in the preoperative holding area and brought to the OR and laid supine on the operating room table.  General anesthesia was smoothly induced.  A foley catheter was inserted under sterile conditions.  His abdomen was prepped and draped in the usual sterile fashion. A surgical timeout was performed indicating the correct patient, procedure, positioning and preoperative antibiotics. If these are also had been placed prior to the initiation of anesthesia. Once this was completed a midline incision was made using a 15 blade scalpel. It was carried down through subcutaneous tissues using blunt  dissection. The fascia was elevated with 2 Coker clamps and incised.  The abdomen was entered bluntly. A hassan port was placed. The abdomen was insufflated to approximately 15 mm mercury.  A right upper quadrant 5 mm port was placed under direct visualization. We began by surveying the abdomen. There were no signs of injury from entry. There were no signs of gross metastatic disease. The liver was evaluated. There were no masses noted. There was a small portion of omentum that was adherent to the lower edge of the liver next to the gallbladder. This was taken down using electrocautery. At this point Dr. [name] came in and performed a liver ultrasound with biopsy of the liver mass noted next the gallbladder. The frozen pathology showed normal liver and necrotic tissue. We will await final pathology. The liver ultrasound and biopsy will be dictated in more detail by Dr. [name].  I began by mobilizing the right colon and terminal ileum using blunt dissection and Enseal device.  The hepatic flexure was taken down using a combination of blunt dissection and energy device. I then identified the area of tattoo in the right transverse colon. The omentum was separated from the transverse colon using the Enseal device.  The remaining colon attachments were resected also using the energy device. After this was completed and the right colon was fully mobilized a midline incision was used to access the abdomen around the umbilical port. This was done with a scalpel and carried down through subcutaneous tissues and fascia using electrocautery. After this was completed a Alexis wound retractor was placed into the patient's abdomen. The terminal ileum was identified. Terminal  ileum was transected using a GIA blue loads 75 mm stapler. The ileocolic vessel was taken using 2 Kelly clamps and a 2-0 silk suture ligature. After this was completed the remaining colon was removed from the abdomen. The middle colic was identified and a  Kelly clamp was placed as low as possible. This was transected and a 2-0 silk suture ligature was used and for hemostasis. The remaining mesentery was taken using the energy device. The colon was then sent to pathology for further examination. After this was completed the remaining terminal ileum was approximated to the transverse colon using 3-0 silk suture ligatures.  Mesentery was confirmed to be without any twisting. 2 enterotomies were performed in the small bowel and the colon. A GIA 70 mm blue load stapler was used to create a common channel. There was an anti-tension suture placed using 3-0 silk suture. The common enterotomy was closed using a 60 mm TA stapler. The stapler misfired and there was a large defect noted. The edges of the staple line were identified and elevated with Allis clamps. A new TA stapler was brought onto the field and the enterotomy site was closed once again. There was a good channel still noted between the small bowel and the large bowel. The staple line corner was oversewn using 2 interrupted 3-0 silk sutures. After this was completed, the anastomosis was placed back into the abdomen and the liver retractor was removed.  The fascia was then closed using interrupted 1-0 PDS suture in interrupted fashion. This allowed [place] to close his umbilical hernia. After this the subcutaneous tissue was irrigated with normal saline. The skin was closed with interrupted 2-0 Vicryl sutures. The abdomen was then insufflated once again and irrigated with 2 L of warm normal saline. Hemostasis was good.  After this was completed the remaining 5 mm ports were removed. The skin of these ports was closed using a 4-0 Vicryl suture. Dermabond was used to close the small sites and a sterile dressing was placed over the large umbilical site. The patient was then awakened from anesthesia and sent to the postanesthesia care unit in stable condition. All counts were correct per operating room  staff.

## 2012-12-11 NOTE — H&P (Signed)
HISTORY:  Gary Frederick is a 57 y.o. male who presents to the office with a hepatic flexure mass. This was found colonoscopy. Workup thus far has included a CT scan of his abdomen. He denies any weight loss or changes in appetite. He does have some bright red blood per rectum. He denies any chest pain or DOE. He has a relatively active lifestyle. He does smoke and he drinks 1-2 beers a day.  Past Medical History   Diagnosis  Date   .  Hyperlipidemia    .  Hypertension    .  Arthritis    .  Gout    .  Insomnia    .  Constipation    .  Cancer  10/2012     colon    Past Surgical History   Procedure  Date   .  Carpal tunnel release      right    Current Outpatient Prescriptions   Medication  Sig  Dispense  Refill   .  ALLOPURINOL PO  Take 100 mg by mouth daily.     Marland Kitchen  AMLODIPINE BESYLATE PO  Take 10 mg by mouth daily.     .  Ezetimibe (ZETIA PO)  Take 10 mg by mouth daily.     Marland Kitchen  LISINOPRIL PO  Take 40 mg by mouth daily.     .  Nebivolol HCl (BYSTOLIC PO)  Take 10 mg by mouth daily.     No Known Allergies  family history strong h/o Huntington's disease, no h/o colon or rectal cancers  History    Social History   .  Marital Status:  Married     Spouse Name:  N/A     Number of Children:  N/A   .  Years of Education:  N/A    Social History Main Topics   .  Smoking status:  Current Every Day Smoker -- 1.0 packs/day for 30 years     Types:  Cigarettes   .  Smokeless tobacco:  None   .  Alcohol Use:  3.0 oz/week     5 Cans of beer per week   .  Drug Use:  No   .  Sexually Active:     Other Topics  Concern   .  None    Social History Narrative   .  None   REVIEW OF SYSTEMS - PERTINENT POSITIVES ONLY:  Review of Systems - General ROS: negative for - chills or fever  Hematological and Lymphatic ROS: negative for - bleeding problems or blood clots  Respiratory ROS: no cough, shortness of breath, or wheezing  Cardiovascular ROS: no chest pain or dyspnea on exertion    Gastrointestinal ROS: no abdominal pain, change in bowel habits, or black or bloody stools  Genito-Urinary ROS: no dysuria, trouble voiding, or hematuria  EXAM:  Filed Vitals:   12/11/12 0516  BP: 142/95  Pulse: 73  Temp: 98.4 F (36.9 C)  Resp: 18    Gen: No acute distress. Well nourished and well groomed.  Neurological: Alert and oriented to person, place, and time. Coordination normal.  Head: Normocephalic and atraumatic.  Eyes: Conjunctivae are normal. Pupils are equal, round, and reactive to light. No scleral icterus.  Neck: Normal range of motion. Neck supple. No tracheal deviation or thyromegaly present. No cervical lymphadenopathy.  Cardiovascular: Normal rate, regular rhythm, normal heart sounds and intact distal pulses. Exam reveals no gallop and no friction rub. No murmur heard.  Respiratory: Effort  normal. No respiratory distress. No chest wall tenderness. Breath sounds normal. No wheezes, rales or rhonchi.  GI: Soft. Bowel sounds are normal. The abdomen is soft and nontender. There is no rebound and no guarding.  Musculoskeletal: Normal range of motion. Extremities are nontender.  Skin: Skin is warm and dry. No rash noted. No diaphoresis. No erythema. No pallor. No clubbing, cyanosis, or edema.  Psychiatric: Normal mood and affect. Behavior is normal. Judgment and thought content normal.  LABORATORY RESULTS:  Available labs are reviewed  CEA: 12.1  RADIOLOGY RESULTS:  Images and reports are reviewed.  CT Abd IMPRESSION:  Suspected eccentric wall thickening along the inferior aspect of the proximal transverse colon, likely corresponding to the known ransverse colon cancer.  No pericolonic extension. No suspicious regional lymphadenopathy.  Possible 1.8 cm hypoenhancing lesion in the inferior right hepatic lobe, equivocal. Consider MRI abdomen with/without contrast for further evaluation.  ASSESSMENT AND PLAN:  Gary Frederick is a 57 y.o. M who was referred to me  for a new finding of a biopsy proven transverse colon cancer. On CT it appears to be near the hepatic flexure. It has been tattooed. There is one indeterminate lesion in his liver on CT. I have ordered a chest CT to complete his pre-op workup.  We will perform an intraoperative liver US to evaluate his liver mass.  If this is cancer, we will do a wedge resection.  The surgery and anatomy were described to the patient as well as the risks of surgery and the possible complications.  These include: Bleeding, infection and possible wound complications such as hernia, damage to adjacent structures, leak of surgical connections, which can lead to other surgeries and possibly an ostomy (5-7%), possible need for other procedures, such as abscess drains in radiology, possible prolonged hospital stay, possible diarrhea from removal of part of the colon, possible constipation from narcotics, prolonged fatigue/weakness or appetite loss, possible early recurrence of cancer, possible complications of their medical problems such as heart disease or arrhythmias or lung problems, death (less than 1%). I talked to him about increase risk of wound and cardiopulmonary complications with smoking. I urged him to quit smoking or at least cut back prior to surgery.  I believe the patient understands and wishes to proceed with the surgery.

## 2012-12-11 NOTE — Preoperative (Signed)
Beta Blockers   Reason not to administer Beta Blockers:Not Applicable 

## 2012-12-11 NOTE — Op Note (Signed)
PRE-OPERATIVE DIAGNOSIS: colon cancer  POST-OPERATIVE DIAGNOSIS:  Same  PROCEDURE:  Procedure(s): Intraoperative liver ultrasound  SURGEON:  Surgeon(s): Almond Lint, MD  ASSISTANT:  Romie Levee, MD  ANESTHESIA:   local and general  DRAINS: none   SPECIMEN:  Core Needle Biopsy right liver nodule  DISPOSITION OF SPECIMEN:  PATHOLOGY  COUNTS:  YES  DICTATION: .Dragon Dictation  PLAN OF CARE: Admit to inpatient   PATIENT DISPOSITION:  left in OR with Dr. Maisie Fus for her part of case  PROCEDURE:     Patient was in the operating room with Dr. Maisie Fus. Intraoperative liver ultrasound was requested for suspicious mass on abdominal CT. The patient's abdomen was accessed by Dr. Maisie Fus with a Roseanne Reno trocar. She placed 2 other 5 mm triggers for her procedure. These were used for the camera.   The laparoscopic ultrasound probe was used through the [facility] trocar. The patient was noted to have very fatty liver with heterogeneous hepatic parenchyma. The liver was ultrasounded systematically. The right side was ultrasounded first with attention to all segments and attention to the right portal vein as well as the right and middle hepatic veins. The probe was then moved to the other side of falciform, in the left lateral segment was ultrasounded segmentally. The left hepatic vein and left portal vein were evaluated as well as the segments left of the falciform.  There was a 1.5 cm mass located directly adjacent to the gallbladder just medially.  There are no additional masses identified on examination.  The ultrasound probe was removed. The patient was left with Dr. Maisie Fus to complete her colectomy as planned.

## 2012-12-11 NOTE — Transfer of Care (Signed)
Immediate Anesthesia Transfer of Care Note  Patient: Gary Frederick  Procedure(s) Performed: Procedure(s) (LRB) with comments: LIVER ULTRASOUND (Right) - Intraoperative Liver Ultrasound LAPAROSCOPIC RIGHT COLON RESECTION (Right)  Patient Location: PACU  Anesthesia Type:General  Level of Consciousness: awake, alert , oriented, patient cooperative and responds to stimulation  Airway & Oxygen Therapy: Patient Spontanous Breathing and Patient connected to face mask oxygen  Post-op Assessment: Report given to PACU RN, Post -op Vital signs reviewed and stable and Patient moving all extremities X 4  Post vital signs: Reviewed and stable  Complications: No apparent anesthesia complications

## 2012-12-11 NOTE — Anesthesia Preprocedure Evaluation (Addendum)
Anesthesia Evaluation  Patient identified by MRN, date of birth, ID band Patient awake    Reviewed: Allergy & Precautions, H&P , NPO status , Patient's Chart, lab work & pertinent test results  Airway Mallampati: III TM Distance: <3 FB Neck ROM: Full    Dental No notable dental hx.    Pulmonary Current Smoker,  + rhonchi   + wheezing      Cardiovascular hypertension, Pt. on medications Rhythm:Regular Rate:Normal     Neuro/Psych negative neurological ROS  negative psych ROS   GI/Hepatic negative GI ROS, (+)     substance abuse  alcohol use, 7-10 beers daily   Endo/Other  negative endocrine ROS  Renal/GU negative Renal ROS  negative genitourinary   Musculoskeletal negative musculoskeletal ROS (+)   Abdominal   Peds negative pediatric ROS (+)  Hematology negative hematology ROS (+)   Anesthesia Other Findings   Reproductive/Obstetrics negative OB ROS                          Anesthesia Physical Anesthesia Plan  ASA: III  Anesthesia Plan: General   Post-op Pain Management:    Induction: Intravenous  Airway Management Planned: Oral ETT  Additional Equipment: Arterial line  Intra-op Plan:   Post-operative Plan: Extubation in OR and Possible Post-op intubation/ventilation  Informed Consent: I have reviewed the patients History and Physical, chart, labs and discussed the procedure including the risks, benefits and alternatives for the proposed anesthesia with the patient or authorized representative who has indicated his/her understanding and acceptance.   Dental advisory given  Plan Discussed with: CRNA and Surgeon  Anesthesia Plan Comments: (May have to keep intubated secondary to ETOH abuse, DT protocol will need to be initiated post-operatively. )       Anesthesia Quick Evaluation

## 2012-12-11 NOTE — Anesthesia Postprocedure Evaluation (Signed)
  Anesthesia Post-op Note  Patient: Gary Frederick  Procedure(s) Performed: Procedure(s) (LRB): LIVER ULTRASOUND (Right) LAPAROSCOPIC RIGHT COLON RESECTION (Right)  Patient Location: PACU  Anesthesia Type: General  Level of Consciousness: awake and alert   Airway and Oxygen Therapy: Patient Spontanous Breathing  Post-op Pain: mild  Post-op Assessment: Post-op Vital signs reviewed, Patient's Cardiovascular Status Stable, Respiratory Function Stable, Patent Airway and No signs of Nausea or vomiting  Last Vitals:  Filed Vitals:   12/11/12 1240  BP:   Pulse: 79  Temp:   Resp: 12    Post-op Vital Signs: stable   Complications: No apparent anesthesia complications

## 2012-12-12 ENCOUNTER — Encounter (HOSPITAL_COMMUNITY): Payer: Self-pay | Admitting: General Surgery

## 2012-12-12 LAB — BASIC METABOLIC PANEL
BUN: 5 mg/dL — ABNORMAL LOW (ref 6–23)
Creatinine, Ser: 0.74 mg/dL (ref 0.50–1.35)
GFR calc Af Amer: >90 mL/min (ref >90)
GFR calc non Af Amer: >90 mL/min (ref >90)
Glucose, Bld: 130 mg/dL — ABNORMAL HIGH (ref 70–99)

## 2012-12-12 LAB — CBC
HCT: 40.7 % (ref 39.0–52.0)
MCH: 31 pg (ref 26.0–34.0)
MCHC: 33.9 g/dL (ref 30.0–36.0)
RDW: 13 % (ref 11.5–15.5)

## 2012-12-12 MED ORDER — ALLOPURINOL 100 MG PO TABS
100.0000 mg | ORAL_TABLET | Freq: Every day | ORAL | Status: DC
  Administered 2012-12-12 – 2012-12-14 (×3): 100 mg via ORAL
  Filled 2012-12-12 (×4): qty 1

## 2012-12-12 MED ORDER — LORAZEPAM 2 MG/ML IJ SOLN: 0.5000 mg | INTRAMUSCULAR | Status: DC | PRN

## 2012-12-12 NOTE — Evaluation (Signed)
Physical Therapy Evaluation Patient Details Name: Gary Frederick MRN: [id number] DOB: Feb 22, 1956 Today's Date: 12/12/2012 Time: 4098-1191 PT Time Calculation (min): 16 min  PT Assessment / Plan / Recommendation Clinical Impression  Pt admitted for colon cancer and s/p intraoperative liver ultrasound and liver biopsy as well as laparoscopic extended right colon resection.  Pt would benefit from acute PT services in order to improve independence with transfers and ambulation to prepare for safe d/c home with spouse.  Pt mostly limited by pain and will likely progress to not require PT services at d/c.    PT Assessment  Patient needs continued PT services    Follow Up Recommendations  No PT follow up    Does the patient have the potential to tolerate intense rehabilitation      Barriers to Discharge        Equipment Recommendations  Other (comment) (possibly 2 wheels for SW depending on progress)    Recommendations for Other Services     Frequency Min 3X/week    Precautions / Restrictions Precautions Precautions: None   Pertinent Vitals/Pain 6/10 abdominal pain, PCA encouraged, pt declined pain meds, repositioned in recliner     Mobility  Bed Mobility Bed Mobility: Supine to Sit Supine to Sit: 4: Min assist;With rails Details for Bed Mobility Assistance: assist for trunk, verbal cues for technique Transfers Transfers: Stand to Sit;Sit to Stand Sit to Stand: 4: Min assist;With upper extremity assist;From bed Stand to Sit: 4: Min guard;With upper extremity assist;To chair/3-in-1 Details for Transfer Assistance: verbal cues for safety, verbal cue for squat to sit down 2* to pain Ambulation/Gait Ambulation/Gait Assistance: 4: Min guard Ambulation Distance (Feet): 160 Feet Assistive device: Rolling walker Ambulation/Gait Assistance Details: verbal cues for safe use of RW, SaO2 89% room air during ambulation and O2 [place] reapplied once back in room Gait Pattern: Step-through  pattern;Decreased trunk rotation;Wide base of support    Shoulder Instructions     Exercises     PT Diagnosis: Difficulty walking;Acute pain  PT Problem List: Decreased activity tolerance;Decreased mobility;Pain;Decreased knowledge of use of DME PT Treatment Interventions: DME instruction;Gait training;Stair training;Functional mobility training;Therapeutic activities;Therapeutic exercise;Patient/family education   PT Goals Acute Rehab PT Goals PT Goal Formulation: With patient Time For Goal Achievement: 12/26/12 Potential to Achieve Goals: Good Pt will go Supine/Side to Sit: with modified independence PT Goal: Supine/Side to Sit - Progress: Goal set today Pt will go Sit to Supine/Side: with modified independence PT Goal: Sit to Supine/Side - Progress: Goal set today Pt will go Sit to Stand: with modified independence PT Goal: Sit to Stand - Progress: Goal set today Pt will go Stand to Sit: with modified independence PT Goal: Stand to Sit - Progress: Goal set today Pt will Ambulate: >150 feet;with modified independence;with least restrictive assistive device PT Goal: Ambulate - Progress: Goal set today Pt will Go Up / Down Stairs: 3-5 stairs;with supervision;with least restrictive assistive device PT Goal: Up/Down Stairs - Progress: Goal set today  Visit Information  Last PT Received On: 12/12/12 Assistance Needed: +1    Subjective Data  Subjective: I'd do anything to get up. Patient Stated Goal: home    Prior Functioning  Home Living Lives With: Spouse Type of Home: House Home Access: Stairs to enter Secretary/administrator of Steps: 3-4 Entrance Stairs-Rails: None Home Layout: One level Home Adaptive Equipment: Walker - standard Prior Function Level of Independence: Independent Communication Communication: No difficulties    Cognition  Overall Cognitive Status: Appears within functional limits for  tasks assessed/performed Arousal/Alertness:  Awake/alert Orientation Level: Appears intact for tasks assessed Behavior During Session: [facility] for tasks performed    Extremity/Trunk Assessment Right Upper Extremity Assessment RUE ROM/Strength/Tone: [facility] for tasks assessed Left Upper Extremity Assessment LUE ROM/Strength/Tone: [facility] for tasks assessed Right Lower Extremity Assessment RLE ROM/Strength/Tone: [facility] for tasks assessed Left Lower Extremity Assessment LLE ROM/Strength/Tone: WFL for tasks assessed   Balance    End of Session PT - End of Session Activity Tolerance: Patient limited by pain Patient left: in chair;with call bell/phone within reach Nurse Communication: Other (comment) (nsg tech observed pt ambulating with PT)  GP     Rey Dansby,KATHrine E 12/12/2012, 10:22 AM Pager: [phone]

## 2012-12-12 NOTE — Progress Notes (Signed)
   CARE MANAGEMENT NOTE 12/12/2012  Patient:  Gary Frederick, Gary Frederick   Account Number:  [id number]  Date Initiated:  12/12/2012  Documentation initiated by:  Jiles Crocker  Subjective/Objective Assessment:   ADMITTED FOR SURGERY - LAP COLON RESECTION     Action/Plan:   LIVES AT HOME WITH SPOUSE/ CM FOLLOWING FOR DCP   Anticipated DC Date:  12/19/2012   Anticipated DC Plan:  HOME/SELF CARE      DC Planning Services  CM consult          Status of service:  In process, will continue to follow Medicare Important Message given?  NA - LOS <3 / Initial given by admissions (If response is "NO", the following Medicare IM given date fields will be blank)  Per UR Regulation:  Reviewed for med. necessity/level of care/duration of stay  Comments:  12/12/2012- B Ailyne Pawley RN,BSN,MHA

## 2012-12-12 NOTE — Progress Notes (Signed)
1 Day Post-Op laparoscopic extended right colectomy Subjective: Doing well.  Vitals stable overnight  Objective: Vital signs in last 24 hours: Temp:  [97.5 F (36.4 C)-98.6 F (37 C)] 98.4 F (36.9 C) (01/10 0457) Pulse Rate:  [60-88] 72  (01/10 0457) Resp:  [9-23] 13  (01/10 0800) BP: (124-153)/(82-93) 146/93 mmHg (01/10 0457) SpO2:  [94 %-100 %] 96 % (01/10 0800) Weight:  [256 lb (116.121 kg)] 256 lb (116.121 kg) (01/09 1311)   Intake/Output from previous day: 01/09 0701 - 01/10 0700 In: 5892.2 [P.O.:240; I.V.:5652.2] Out: 3475 [Urine:3275; Blood:200] Intake/Output this shift:   General appearance: alert, cooperative and no distress Resp: non labored Cardio: regular rate and rhythm GI: soft, appropriately tender at incision site, non distended  Incision: slight serosanguinous drainage present  Lab Results:   Basename 12/12/12 0430  WBC 9.8  HGB 13.8  HCT 40.7  PLT 173   BMET  Basename 12/12/12 0430  NA 132*  K 3.6  CL 94*  CO2 31  GLUCOSE 130*  BUN 5*  CREATININE 0.74  CALCIUM 8.6   MEDS, Scheduled    . allopurinol  100 mg Oral Q lunch  . alvimopan  12 mg Oral BID  . amLODipine  10 mg Oral Q lunch  . enoxaparin (LOVENOX) injection  40 mg Subcutaneous Q24H  . ezetimibe  10 mg Oral Q1200  . lisinopril  40 mg Oral Q lunch  . morphine   Intravenous Q4H  . nebivolol  10 mg Oral Q lunch    Studies/Results: No results found.  Assessment: s/p Procedure(s): LIVER ULTRASOUND LAPAROSCOPIC RIGHT COLON RESECTION Patient Active Problem List  Diagnosis  . HYPERLIPIDEMIA  . GOUT  . ALCOHOL ABUSE  . TOBACCO ABUSE  . HYPERTENSION  . LYMPHADENOPATHY  . CARPAL TUNNEL RELEASE, RIGHT, HX OF  . Colon cancer    Doing well  Plan: d/c foley Decrease IVF's to 72ml/h Advance diet to sips of clears Ambulate[employer] Inc Spirometry, Lov SQ for DVT prophylaxis Cont tele, ok to travel off tele CIWA  assessment q shift, ativan for alcohol withdrawal symptoms   LOS: 1 day     .Vanita Panda, MD [facility] Surgery, [place] [phone]   12/12/2012 10:25 AM

## 2012-12-13 LAB — BASIC METABOLIC PANEL
BUN: 6 mg/dL (ref 6–23)
Calcium: 8.9 mg/dL (ref 8.4–10.5)
Chloride: 97 mEq/L (ref 96–112)
Creatinine, Ser: 0.7 mg/dL (ref 0.50–1.35)
GFR calc Af Amer: >90 mL/min (ref >90)
GFR calc non Af Amer: >90 mL/min (ref >90)

## 2012-12-13 LAB — CBC
HCT: 39.2 % (ref 39.0–52.0)
MCHC: 34.2 g/dL (ref 30.0–36.0)
Platelets: 165 10*3/uL (ref 150–400)
RDW: 12.7 % (ref 11.5–15.5)
WBC: 9 10*3/uL (ref 4.0–10.5)

## 2012-12-13 NOTE — Progress Notes (Signed)
Patient congested, productive cough with yellow-green sputum.  Use Incentive spirometry, , 5 times.

## 2012-12-13 NOTE — Progress Notes (Signed)
Patient ID: Gary Frederick, male   DOB: 1956-10-26, 57 y.o.   MRN: [id number] [facility] Surgery Progress Note:   2 Days Post-Op  Subjective: Mental status is clear. Up taking bath Objective: Vital signs in last 24 hours: Temp:  [98 F (36.7 C)-98.5 F (36.9 C)] 98.5 F (36.9 C) (01/11 0558) Pulse Rate:  [71-79] 79  (01/11 0558) Resp:  [16-20] 20  (01/11 0832) BP: (136-150)/(88-94) 149/94 mmHg (01/11 0558) SpO2:  [94 %-98 %] 96 % (01/11 0832)  Intake/Output from previous day: 01/10 0701 - 01/11 0700 In: 1848.8 [I.V.:1848.8] Out: 2575 [Urine:2575] Intake/Output this shift: Total I/O In: -  Out: 300 [Urine:300]  Physical Exam: Work of breathing is normal.  Taking clear liquids OK.   Lab Results:  Results for orders placed during the hospital encounter of 12/11/12 (from the past 48 hour(s))  BASIC METABOLIC PANEL     Status: Abnormal   Collection Time   12/12/12  4:30 AM      Component Value Range Comment   Sodium 132 (*) 135 - 145 mEq/L    Potassium 3.6  3.5 - 5.1 mEq/L    Chloride 94 (*) 96 - 112 mEq/L    CO2 31  19 - 32 mEq/L    Glucose, Bld 130 (*) 70 - 99 mg/dL    BUN 5 (*) 6 - 23 mg/dL    Creatinine, Ser 4.09  0.50 - 1.35 mg/dL    Calcium 8.6  8.4 - 81.1 mg/dL    GFR calc non Af Amer >90  >90 mL/min    GFR calc Af Amer >90  >90 mL/min   CBC     Status: Normal   Collection Time   12/12/12  4:30 AM      Component Value Range Comment   WBC 9.8  4.0 - 10.5 K/uL    RBC 4.45  4.22 - 5.81 MIL/uL    Hemoglobin 13.8  13.0 - 17.0 g/dL    HCT 91.4  78.2 - 95.6 %    MCV 91.5  78.0 - 100.0 fL    MCH 31.0  26.0 - 34.0 pg    MCHC 33.9  30.0 - 36.0 g/dL    RDW 21.3  08.6 - 57.8 %    Platelets 173  150 - 400 K/uL   BASIC METABOLIC PANEL     Status: Abnormal   Collection Time   12/13/12  5:50 AM      Component Value Range Comment   Sodium 134 (*) 135 - 145 mEq/L    Potassium 3.7  3.5 - 5.1 mEq/L    Chloride 97  96 - 112 mEq/L    CO2 28  19 - 32 mEq/L    Glucose,  Bld 115 (*) 70 - 99 mg/dL    BUN 6  6 - 23 mg/dL    Creatinine, Ser 4.69  0.50 - 1.35 mg/dL    Calcium 8.9  8.4 - 62.9 mg/dL    GFR calc non Af Amer >90  >90 mL/min    GFR calc Af Amer >90  >90 mL/min   CBC     Status: Normal   Collection Time   12/13/12  5:50 AM      Component Value Range Comment   WBC 9.0  4.0 - 10.5 K/uL    RBC 4.28  4.22 - 5.81 MIL/uL    Hemoglobin 13.4  13.0 - 17.0 g/dL    HCT 52.8  41.3 - 24.4 %  MCV 91.6  78.0 - 100.0 fL    MCH 31.3  26.0 - 34.0 pg    MCHC 34.2  30.0 - 36.0 g/dL    RDW 95.6  21.3 - 08.6 %    Platelets 165  150 - 400 K/uL     Radiology/Results: No results found.  Anti-infectives: Anti-infectives     Start     Dose/Rate Route Frequency Ordered Stop   12/11/12 0517   cefOXitin (MEFOXIN) 2 g in dextrose 5 % 50 mL IVPB        2 g 100 mL/hr over 30 Minutes Intravenous On call to O.R. 12/11/12 0517 12/11/12 0723          Assessment/Plan: Problem List: Patient Active Problem List  Diagnosis  . HYPERLIPIDEMIA  . GOUT  . ALCOHOL ABUSE  . TOBACCO ABUSE  . HYPERTENSION  . LYMPHADENOPATHY  . CARPAL TUNNEL RELEASE, RIGHT, HX OF  . Colon cancer    Advance to full liquids.  2 Days Post-Op    LOS: 2 days   Matt B. Daphine Deutscher, MD, [facility] [phone] beeper [phone]  12/13/2012 10:09 AM

## 2012-12-14 DIAGNOSIS — E876 Hypokalemia: Secondary | ICD-10-CM | POA: Diagnosis not present

## 2012-12-14 LAB — BASIC METABOLIC PANEL
Chloride: 99 mEq/L (ref 96–112)
GFR calc Af Amer: >90 mL/min (ref >90)
GFR calc non Af Amer: >90 mL/min (ref >90)
Potassium: 3.4 mEq/L — ABNORMAL LOW (ref 3.5–5.1)
Sodium: 137 mEq/L (ref 135–145)

## 2012-12-14 LAB — CBC
HCT: 37.1 % — ABNORMAL LOW (ref 39.0–52.0)
Hemoglobin: 12.6 g/dL — ABNORMAL LOW (ref 13.0–17.0)
RBC: 4.08 MIL/uL — ABNORMAL LOW (ref 4.22–5.81)
WBC: 8.9 10*3/uL (ref 4.0–10.5)

## 2012-12-14 MED ORDER — POTASSIUM CHLORIDE 10 MEQ/100ML IV SOLN
10.0000 meq | INTRAVENOUS | Status: AC
  Administered 2012-12-14 (×2): 10 meq via INTRAVENOUS
  Filled 2012-12-14 (×2): qty 100

## 2012-12-14 MED ORDER — POTASSIUM CHLORIDE 2 MEQ/ML IV SOLN
INTRAVENOUS | Status: DC
  Administered 2012-12-14: 11:00:00 via INTRAVENOUS
  Filled 2012-12-14 (×3): qty 1000

## 2012-12-14 NOTE — Progress Notes (Signed)
3 Days Post-Op  Subjective: Tolerating liquids.  Bowels moving. Not having much pain.  Objective: Vital signs in last 24 hours: Temp:  [98.7 F (37.1 C)-99.2 F (37.3 C)] 98.7 F (37.1 C) (01/12 0438) Pulse Rate:  [68-79] 68  (01/12 0438) Resp:  [14-21] 20  (01/12 0800) BP: (133-141)/(80-92) 133/80 mmHg (01/12 0438) SpO2:  [93 %-98 %] 98 % (01/12 0800) Last BM Date: 12/14/12  Intake/Output from previous day: 01/11 0701 - 01/12 0700 In: 1612.5 [P.O.:360; I.V.:1252.5] Out: 2050 [Urine:2050] Intake/Output this shift:    PE: General- In NAD Abdomen-soft, dressing dry  Lab Results:   [facility] 12/14/12 0515 12/13/12 0550  WBC 8.9 9.0  HGB 12.6* 13.4  HCT 37.1* 39.2  PLT 186 165   BMET  Basename 12/14/12 0515 12/13/12 0550  NA 137 134*  K 3.4* 3.7  CL 99 97  CO2 29 28  GLUCOSE 110* 115*  BUN 7 6  CREATININE 0.78 0.70  CALCIUM 8.9 8.9   PT/INR No results found for this basename: LABPROT:2,INR:2 in the last 72 hours Comprehensive Metabolic Panel:    Component Value Date/Time   NA 137 12/14/2012 0515   K 3.4* 12/14/2012 0515   CL 99 12/14/2012 0515   CO2 29 12/14/2012 0515   BUN 7 12/14/2012 0515   CREATININE 0.78 12/14/2012 0515   GLUCOSE 110* 12/14/2012 0515   CALCIUM 8.9 12/14/2012 0515     Studies/Results: No results found.  Anti-infectives: Anti-infectives     Start     Dose/Rate Route Frequency Ordered Stop   12/11/12 0517   cefOXitin (MEFOXIN) 2 g in dextrose 5 % 50 mL IVPB        2 g 100 mL/hr over 30 Minutes Intravenous On call to O.R. 12/11/12 0517 12/11/12 0723          Assessment Principal Problem:  *Colon cancer-s/p extended right colectomy and liver biopsy-bowel function has returned.  Mild hypokalemia    LOS: 3 days   Plan: Advance diet.  D/C PCA.  Correct hypokalemia.   Edeline Greening J 12/14/2012

## 2012-12-15 MED ORDER — DOCUSATE SODIUM 100 MG PO CAPS: 100.0000 mg | ORAL_CAPSULE | Freq: Two times a day (BID) | ORAL | Status: DC

## 2012-12-15 MED ORDER — OXYCODONE-ACETAMINOPHEN 5-325 MG PO TABS: 1.0000 | ORAL_TABLET | ORAL | Status: DC | PRN

## 2012-12-15 NOTE — Discharge Summary (Signed)
Physician Discharge Summary  Patient ID: Gary Frederick MRN: [id number] DOB/AGE: 05-19-1956 57 y.o.  Admit date: 12/11/2012 Discharge date: 12/15/2012  Admission Diagnoses:  Discharge Diagnoses:  Principal Problem:  *Colon cancer Active Problems:  ALCOHOL ABUSE   Discharged Condition: good  Hospital Course: The patient was admitted after laparoscopic extended right hemicolectomy.  He tolerated the procedure well.  His diet was advanced without difficulty.  His bowel movements returned by the evening of POD 1.  By POD 4 the patient was tolerating a regular diet, his pain was controlled with PO pain medications and he was urinating and ambulating without difficulty.  He was determined to be in stable condition for discharge to home.    Consults:None  Significant Diagnostic Studies: labs: CBC, Chemistry  Treatments: IV hydration, analgesia: acetaminophen w/ codeine and Morphine, surgery: lap right colon and PT  Discharge Exam: Blood pressure 124/89, pulse 72, temperature 98.2 F (36.8 C), temperature source Oral, resp. rate 20, height 6\' 1"  (1.854 m), weight 256 lb (116.121 kg), SpO2 96.00%. General appearance: alert and cooperative Resp: clear to auscultation bilaterally Cardio: regular rate and rhythm GI: soft, appropriately tender Incisions: clean, dry and intact  Disposition: Home      Discharge Orders    Future Appointments: Provider: Department: Dept Phone: Center:   12/25/2012 3:00 PM Ladene Artist, MD [facility] [phone] None       Medication List     As of 12/15/2012 10:37 AM    TAKE these medications         allopurinol 100 MG tablet   Commonly known as: ZYLOPRIM   Take 100 mg by mouth daily with lunch.      amLODipine 10 MG tablet   Commonly known as: NORVASC   Take 10 mg by mouth daily with lunch.      bisacodyl 5 MG EC tablet   Generic drug: bisacodyl   Take 5 mg by mouth daily as needed. For constipation      CLEAR EYES REDNESS RELIEF 0.012-0.2 % Soln   Generic drug: naphazoline-glycerin   Place 1-2 drops into both eyes every 4 (four) hours as needed. For redness      docusate sodium 100 MG capsule   Commonly known as: COLACE   Take 1 capsule (100 mg total) by mouth 2 (two) times daily.      ezetimibe 10 MG tablet   Commonly known as: ZETIA   Take 10 mg by mouth daily at 12 noon.      lisinopril 40 MG tablet   Commonly known as: PRINIVIL,ZESTRIL   Take 40 mg by mouth daily with lunch.      nebivolol 10 MG tablet   Commonly known as: BYSTOLIC   Take 10 mg by mouth daily with lunch.      oxyCODONE-acetaminophen 5-325 MG per tablet   Commonly known as: PERCOCET/ROXICET   Take 1-2 tablets by mouth every 4 (four) hours as needed for pain.        Follow-up Information    Follow up with Vanita Panda., MD. In 2 weeks.   Contact information:   [address], [address] [place] [address] [phone]          Signed: Vanita Panda 12/15/2012, 10:37 AM

## 2012-12-15 NOTE — Progress Notes (Signed)
Pharmacy Brief Note - Alvimopan (Entereg)  The standing order set for alvimopan (Entereg) now includes an automatic order to discontinue the drug after the patient has had a bowel movement.  The change was approved by the Pharmacy & Therapeutics Committee and the Medical Executive Committee.    This patient has had a bowel movement documented by nursing.  Therefore, alvimopan has been discontinued.  If there are questions, please contact the pharmacy at [phone]  Thank you-  Loralee Pacas, PharmD, BCPS 12/15/2012 8:30 AM

## 2012-12-22 ENCOUNTER — Encounter (INDEPENDENT_AMBULATORY_CARE_PROVIDER_SITE_OTHER): Payer: Self-pay

## 2012-12-25 ENCOUNTER — Telehealth: Payer: Self-pay | Admitting: Oncology

## 2012-12-25 ENCOUNTER — Ambulatory Visit (HOSPITAL_BASED_OUTPATIENT_CLINIC_OR_DEPARTMENT_OTHER): Payer: Medicare Other | Admitting: Oncology

## 2012-12-25 VITALS — BP 133/80 | HR 78 | Temp 98.0°F | Resp 22 | Ht 73.0 in | Wt 246.8 lb

## 2012-12-25 DIAGNOSIS — C189 Malignant neoplasm of colon, unspecified: Secondary | ICD-10-CM

## 2012-12-25 DIAGNOSIS — C184 Malignant neoplasm of transverse colon: Secondary | ICD-10-CM

## 2012-12-25 NOTE — Progress Notes (Signed)
   [facility]    OFFICE PROGRESS NOTE   INTERVAL HISTORY:   He returns as scheduled. He underwent a laparoscopic right colon resection and intraoperative ultrasound/liver biopsy on 12/11/2012. He has recovered uneventfully. He is eating a regular diet. There was no sign of metastatic disease at the time of surgery. A mass was noted next to the gallbladder on liver ultrasound. The mass was biopsied. No additional masses were identified.  The pathology [id number]) revealed an invasive adenocarcinoma of the right colon invading through the muscular propria into the pericolonic fat. No evidence of angiolymphatic invasion was identified. 19 lymph nodes were negative for metastatic carcinoma. 2 additional tubular adenomas into hyperplastic polyp were noted. The resection margins were negative. The liver biopsy revealed  focal necrosis and no evidence of malignancy. The tumor had a grade 1 histology. The tumor returned microsatellite stable and there was no loss of mismatch repair proteins.    Objective:  Vital signs in last 24 hours:  Blood pressure 133/80, pulse 78, temperature 98 F (36.7 C), temperature source Oral, resp. rate 22, height 6\' 1"  (1.854 m), weight 246 lb 12.8 oz (111.948 kg).    HEENT: 1 cm firm cutaneous lesion in the right trapezius area Resp: Lungs clear bilaterally Cardio: Regular rate and rhythm GI: Healed surgical incisions, no hepatomegaly Vascular: No leg edema    Medications: I have reviewed the patient's current medications.  Assessment/Plan: 1. Adenocarcinoma transverse colon , status post a right colectomy on 12/11/2012, stage II (T3 N0) well differentiated adenocarcinoma, the tumor is microsatellite stable and no loss of expression of mismatch repair proteins 2. Indeterminate right liver lesion on a CT 10/13/2012 , status post and intraoperative ultrasound and biopsy on 12/11/2012 with no malignancy found 3. Mildly elevated preoperative CEA    4. Gout  5. Cutaneous nodular lesions-likely sebaceous cysts versus sarcoidosis  6. Calcified chest lymph nodes on a CT 10/27/2012, "granuloma "found on the surgical pathology 12/11/2012-? Sarcoidosis.  Disposition:  Gary Frederick has been diagnosed with stage II colon cancer. I discussed the diagnosis, prognosis, and adjuvant treatment options with him today. We reviewed the surgical pathology report in detail. He has a good prognosis for a long-term disease-free survival. We discussed the controversy surrounding the use of adjuvant chemotherapy in patients with resected stage II colon cancer. We discussed the small absolute predicted benefit with adjuvant capecitabine. He indicated that he does not wish to receive adjuvant chemotherapy.  I recommended a high-fiber, low-fat diet. I also recommended he discuss aspirin therapy with his primary physician with recent evidence that aspirin may decrease the risk of developing recurrent colon cancer. He understands this is not yet a standard recommendation.  He will return for a CEA and ACE level in 3 weeks. He will return for an office visit and CEA in 6 months.  Gary Frederick should be scheduled for a one-year surveillance colonoscopy.   Gary Frederick, Gary Frederick  12/25/2012  4:41 PM

## 2012-12-25 NOTE — Telephone Encounter (Signed)
Gave pt appt for lab February 2014, then July 2014 lab and MD

## 2013-01-01 ENCOUNTER — Ambulatory Visit (INDEPENDENT_AMBULATORY_CARE_PROVIDER_SITE_OTHER): Payer: Medicare Other | Admitting: General Surgery

## 2013-01-01 ENCOUNTER — Encounter (INDEPENDENT_AMBULATORY_CARE_PROVIDER_SITE_OTHER): Payer: Self-pay | Admitting: General Surgery

## 2013-01-01 VITALS — BP 120/84 | HR 72 | Temp 97.8°F | Resp 18 | Ht 75.0 in | Wt 251.4 lb

## 2013-01-01 DIAGNOSIS — C189 Malignant neoplasm of colon, unspecified: Secondary | ICD-10-CM

## 2013-01-01 NOTE — Progress Notes (Signed)
Gary Frederick is a 57 y.o. male who is status post a lap extended R colectomy and I/O liver US with biopsy on 1/9.  His biopsy showed granuloma only with no malignancy.  He is doing well.  He is having regular BM's.  He is off the narcotics.  He feels "great".    Objective: Filed Vitals:   01/01/13 1515  BP: 120/84  Pulse: 72  Temp: 97.8 F (36.6 C)  Resp: 18    General appearance: alert and cooperative GI: normal findings: soft, non-tender  Incision: healing well, no hernias   Assessment: s/p  Patient Active Problem List  Diagnosis  . HYPERLIPIDEMIA  . GOUT  . ALCOHOL ABUSE  . TOBACCO ABUSE  . HYPERTENSION  . LYMPHADENOPATHY  . CARPAL TUNNEL RELEASE, RIGHT, HX OF  . Colon cancer    Plan: Gary Frederick is a 57 y.o. M with a T3N0 right sided colon cancer.  He has seen Dr Truett Perna and has elected to forego chemotherapy.  He will get a repeat CEA in 3 wks and will see Dr Truett Perna back in 6 months.  I will see him back in 3 months or sooner if needed.  At that time we will decide on imaging his liver to make sure the mass near her gallbladder has not changed in size.      Marland KitchenVanita Panda, MD [facility], [place] [phone]   01/01/2013 3:36 PM

## 2013-01-01 NOTE — Patient Instructions (Signed)
No heavy lifting for 8 weeks after surgery.  Return to office for check up in 3 months.

## 2013-01-06 DIAGNOSIS — E119 Type 2 diabetes mellitus without complications: Secondary | ICD-10-CM | POA: Diagnosis not present

## 2013-01-06 DIAGNOSIS — I10 Essential (primary) hypertension: Secondary | ICD-10-CM | POA: Diagnosis not present

## 2013-01-13 ENCOUNTER — Other Ambulatory Visit: Payer: Medicare Other | Admitting: Lab

## 2013-01-13 DIAGNOSIS — C189 Malignant neoplasm of colon, unspecified: Secondary | ICD-10-CM

## 2013-01-13 DIAGNOSIS — E119 Type 2 diabetes mellitus without complications: Secondary | ICD-10-CM | POA: Diagnosis not present

## 2013-01-14 LAB — ANGIOTENSIN CONVERTING ENZYME: Angiotensin 1 CE: 6 U/L — ABNORMAL LOW (ref 8–52)

## 2013-01-14 LAB — CEA: CEA: 4.6 ng/mL (ref 0.0–5.0)

## 2013-01-15 ENCOUNTER — Telehealth: Payer: Self-pay | Admitting: Oncology

## 2013-01-15 ENCOUNTER — Telehealth: Payer: Self-pay | Admitting: *Deleted

## 2013-01-15 DIAGNOSIS — C189 Malignant neoplasm of colon, unspecified: Secondary | ICD-10-CM

## 2013-01-15 NOTE — Telephone Encounter (Signed)
Notified that CEA is in normal range now at 4.6. MD wants to recheck in 3 months. POF to scheduler.

## 2013-01-15 NOTE — Telephone Encounter (Signed)
S/w the pt and he is aware of his lab appt in may 2014

## 2013-01-15 NOTE — Telephone Encounter (Signed)
Message copied by Wandalee Ferdinand on Thu Jan 15, 2013 11:19 AM ------      Message from: Thornton Papas B      Created: Thu Jan 15, 2013  8:48 AM       Please call patient, cea now high end of normal range, check 3 months ------

## 2013-02-02 ENCOUNTER — Encounter (INDEPENDENT_AMBULATORY_CARE_PROVIDER_SITE_OTHER): Payer: Self-pay

## 2013-02-02 NOTE — Progress Notes (Signed)
Received a fax from [facility] [address] for refill on [facility] Stool Softener 100 mg caps #30.  Take 1 cap by mouth 2 times daily.  I got the authorization from Dr Maisie Fus to refill.  I faxed the refill to [facility] [phone]

## 2013-02-27 DIAGNOSIS — E119 Type 2 diabetes mellitus without complications: Secondary | ICD-10-CM | POA: Diagnosis not present

## 2013-02-27 DIAGNOSIS — R000 Tachycardia, unspecified: Secondary | ICD-10-CM | POA: Diagnosis not present

## 2013-02-27 DIAGNOSIS — E7800 Pure hypercholesterolemia, unspecified: Secondary | ICD-10-CM | POA: Diagnosis not present

## 2013-03-17 DIAGNOSIS — E119 Type 2 diabetes mellitus without complications: Secondary | ICD-10-CM | POA: Diagnosis not present

## 2013-03-17 DIAGNOSIS — I10 Essential (primary) hypertension: Secondary | ICD-10-CM | POA: Diagnosis not present

## 2013-03-25 ENCOUNTER — Ambulatory Visit (INDEPENDENT_AMBULATORY_CARE_PROVIDER_SITE_OTHER): Payer: Medicare Other | Admitting: General Surgery

## 2013-03-25 ENCOUNTER — Encounter (INDEPENDENT_AMBULATORY_CARE_PROVIDER_SITE_OTHER): Payer: Self-pay | Admitting: General Surgery

## 2013-03-25 ENCOUNTER — Other Ambulatory Visit (INDEPENDENT_AMBULATORY_CARE_PROVIDER_SITE_OTHER): Payer: Self-pay

## 2013-03-25 ENCOUNTER — Other Ambulatory Visit (INDEPENDENT_AMBULATORY_CARE_PROVIDER_SITE_OTHER): Payer: Self-pay | Admitting: General Surgery

## 2013-03-25 VITALS — BP 130/74 | HR 84 | Temp 97.2°F | Resp 20 | Ht 75.0 in | Wt 256.0 lb

## 2013-03-25 DIAGNOSIS — C189 Malignant neoplasm of colon, unspecified: Secondary | ICD-10-CM | POA: Diagnosis not present

## 2013-03-25 LAB — CREATININE, SERUM: Creat: 0.91 mg/dL (ref 0.50–1.35)

## 2013-03-25 NOTE — Progress Notes (Signed)
Gary Frederick is a 57 y.o. male who is status post a lap extended R colectomy and I/O liver US with biopsy on 1/9. His biopsy showed granuloma only with no malignancy. He is doing well. He is having regular BM's.  He has occasional mild abd cramping but denies any major pain or changes is weight or appetite.   Objective:  Filed Vitals:   03/25/13 1409  BP: 130/74  Pulse: 84  Temp: 97.2 F (36.2 C)  Resp: 20   General appearance: alert and cooperative  GI: normal findings: soft, non-tender  Incision: no hernias  Assessment:  s/p  Patient Active Problem List   Diagnosis   .  HYPERLIPIDEMIA   .  GOUT   .  ALCOHOL ABUSE   .  TOBACCO ABUSE   .  HYPERTENSION   .  LYMPHADENOPATHY   .  CARPAL TUNNEL RELEASE, RIGHT, HX OF   .  Colon cancer   Plan:  Gary Frederick is a 57 y.o. M with a T3N0 right sided colon cancer. He has seen Dr Truett Perna and has elected to forego chemotherapy. He will get a repeat CEA in May s and will see Dr Truett Perna back in July. I will schedule a CT scan in late May to follow up on his liver mass.  I will see him back in 6 months or sooner if needed.   Vanita Panda, MD  [facility], [place]  [phone]

## 2013-03-25 NOTE — Patient Instructions (Signed)
We will schedule a CT scan of your liver after you get your labs drawn in May.  Follow up with Dr Truett Perna in July.  I will see you back in Oct.

## 2013-04-14 ENCOUNTER — Other Ambulatory Visit: Payer: Medicare Other | Admitting: Lab

## 2013-04-14 DIAGNOSIS — C189 Malignant neoplasm of colon, unspecified: Secondary | ICD-10-CM

## 2013-04-15 ENCOUNTER — Other Ambulatory Visit (INDEPENDENT_AMBULATORY_CARE_PROVIDER_SITE_OTHER): Payer: Self-pay | Admitting: *Deleted

## 2013-04-15 ENCOUNTER — Telehealth (INDEPENDENT_AMBULATORY_CARE_PROVIDER_SITE_OTHER): Payer: Self-pay | Admitting: *Deleted

## 2013-04-15 DIAGNOSIS — C189 Malignant neoplasm of colon, unspecified: Secondary | ICD-10-CM

## 2013-04-15 NOTE — Telephone Encounter (Signed)
Alice with [facility] called to request CT Abd/Pelvis be changed from with to with/without.  Spoke to [place] MD who gave verbal order to change order to CT Abd/Pelvis w/wo contrast.  Ordered changed in Epic at this time.  Alice with GI updated on change.

## 2013-04-16 ENCOUNTER — Ambulatory Visit
Admission: RE | Admit: 2013-04-16 | Discharge: 2013-04-16 | Disposition: A | Discharge status: Home / Self Care | Payer: Medicare Other | Source: Ambulatory Visit | Attending: General Surgery | Admitting: General Surgery

## 2013-04-16 DIAGNOSIS — E119 Type 2 diabetes mellitus without complications: Secondary | ICD-10-CM | POA: Diagnosis not present

## 2013-04-16 DIAGNOSIS — I10 Essential (primary) hypertension: Secondary | ICD-10-CM | POA: Diagnosis not present

## 2013-04-16 DIAGNOSIS — C189 Malignant neoplasm of colon, unspecified: Secondary | ICD-10-CM

## 2013-04-16 DIAGNOSIS — E785 Hyperlipidemia, unspecified: Secondary | ICD-10-CM | POA: Diagnosis not present

## 2013-04-16 DIAGNOSIS — K7689 Other specified diseases of liver: Secondary | ICD-10-CM | POA: Diagnosis not present

## 2013-04-16 MED ORDER — IOHEXOL 300 MG/ML  SOLN: 125.0000 mL | Freq: Once | INTRAMUSCULAR | Status: AC | PRN

## 2013-04-17 ENCOUNTER — Telehealth (INDEPENDENT_AMBULATORY_CARE_PROVIDER_SITE_OTHER): Payer: Self-pay

## 2013-04-17 NOTE — Telephone Encounter (Signed)
Message copied by Ivory Broad on Fri Apr 17, 2013 12:56 PM ------      Message from: [place], Broadus John.      Created: Fri Apr 17, 2013  9:11 AM      Regarding: results       Please let him know that his CT showed a stable lesion and that it doesn't look like cancer on this CT just fat.            AT      ----- Message -----         From: Rad Results In Interface         Sent: 04/16/2013   4:21 PM           To: Romie Levee, MD                   ------

## 2013-04-17 NOTE — Telephone Encounter (Signed)
I called and notified the pt of the results.

## 2013-04-20 ENCOUNTER — Telehealth: Payer: Self-pay | Admitting: *Deleted

## 2013-04-20 NOTE — Telephone Encounter (Signed)
Pt notified cea is normal. F/u as schedules

## 2013-05-18 DIAGNOSIS — I10 Essential (primary) hypertension: Secondary | ICD-10-CM | POA: Diagnosis not present

## 2013-05-18 DIAGNOSIS — E7800 Pure hypercholesterolemia, unspecified: Secondary | ICD-10-CM | POA: Diagnosis not present

## 2013-05-18 DIAGNOSIS — E119 Type 2 diabetes mellitus without complications: Secondary | ICD-10-CM | POA: Diagnosis not present

## 2013-06-19 ENCOUNTER — Other Ambulatory Visit (HOSPITAL_BASED_OUTPATIENT_CLINIC_OR_DEPARTMENT_OTHER): Payer: Medicare Other

## 2013-06-19 DIAGNOSIS — C189 Malignant neoplasm of colon, unspecified: Secondary | ICD-10-CM | POA: Diagnosis not present

## 2013-06-19 LAB — CEA: CEA: 4.3 ng/mL (ref 0.0–5.0)

## 2013-06-23 ENCOUNTER — Ambulatory Visit (HOSPITAL_BASED_OUTPATIENT_CLINIC_OR_DEPARTMENT_OTHER): Payer: Medicare Other | Admitting: Oncology

## 2013-06-23 VITALS — BP 140/87 | HR 87 | Temp 98.7°F | Resp 18 | Ht 75.0 in | Wt 257.1 lb

## 2013-06-23 DIAGNOSIS — C189 Malignant neoplasm of colon, unspecified: Secondary | ICD-10-CM

## 2013-06-23 NOTE — Progress Notes (Signed)
   [facility]    OFFICE PROGRESS NOTE   INTERVAL HISTORY:   He returns as scheduled. He feels well. Good appetite. Chronic "congestion" in the chest. He continues to smoke.  Objective:  Vital signs in last 24 hours:  Blood pressure 140/87, pulse 87, temperature 98.7 F (37.1 C), temperature source Oral, resp. rate 18, height 6\' 3"  (1.905 m), weight 257 lb 1.6 oz (116.62 kg).    HEENT: Neck without mass Lymphatics: No cervical, supra-clavicular, axillary, or inguinal nodes Resp: Lungs clear bilaterally Cardio: Regular rate and rhythm GI: No hepatomegaly nontender, no mass Vascular: No leg edema   Lab Results:  CEA on 04/14/2013- 4.7 CEA on 06/19/2013- 4.3   Medications: I have reviewed the patient's current medications.  Assessment/Plan: 1.Adenocarcinoma transverse colon , status post a right colectomy on 12/11/2012, stage II (T3 N0) well differentiated adenocarcinoma, the tumor is microsatellite stable and no loss of expression of mismatch repair proteins  2. Indeterminate right liver lesion on a CT 10/13/2012 , status post and intraoperative ultrasound and biopsy on 12/11/2012 with no malignancy found , stable liver on a CT of the abdomen May 2014 3. Mildly elevated preoperative CEA  4. Gout  5. Cutaneous nodular lesions-likely sebaceous cysts versus sarcoidosis  6. Calcified chest lymph nodes on a CT 10/27/2012, "granuloma "found on the surgical pathology from the liver biopsy 12/11/2012-? Sarcoidosis. The ACE level was not elevated on 01/13/2013  Disposition:  Gary Frederick remains in clinical remission from colon cancer. We will refer him to Dr. Elnoria Howard for a surveillance colonoscopy. He will return for an office visit and CEA in one year.   Thornton Papas, MD  06/23/2013  4:37 PM

## 2013-06-26 ENCOUNTER — Telehealth: Payer: Self-pay | Admitting: *Deleted

## 2013-06-26 NOTE — Telephone Encounter (Signed)
sw pt gv appt for 12/29/13 w/ labs@3pm  and ov @3 :30pm. Pt is aware that i will mail a letter/avs...td

## 2013-06-30 ENCOUNTER — Encounter: Payer: Self-pay | Admitting: *Deleted

## 2013-06-30 NOTE — Progress Notes (Signed)
Faxed referral for Dr. Jeani Hawking for 1 year screening colonosocopy due 10/2013 to office [phone] per MD order on 06/23/13.

## 2013-07-01 NOTE — Progress Notes (Signed)
Faxed response from Dr. Haywood Pao office that his appointment for office visit is 09/14/13 at 0900.

## 2013-07-20 DIAGNOSIS — E119 Type 2 diabetes mellitus without complications: Secondary | ICD-10-CM | POA: Diagnosis not present

## 2013-07-20 DIAGNOSIS — I10 Essential (primary) hypertension: Secondary | ICD-10-CM | POA: Diagnosis not present

## 2013-08-10 DIAGNOSIS — E7800 Pure hypercholesterolemia, unspecified: Secondary | ICD-10-CM | POA: Diagnosis not present

## 2013-08-10 DIAGNOSIS — Z23 Encounter for immunization: Secondary | ICD-10-CM | POA: Diagnosis not present

## 2013-08-10 DIAGNOSIS — E119 Type 2 diabetes mellitus without complications: Secondary | ICD-10-CM | POA: Diagnosis not present

## 2013-09-11 DIAGNOSIS — Z85038 Personal history of other malignant neoplasm of large intestine: Secondary | ICD-10-CM | POA: Diagnosis not present

## 2013-09-22 ENCOUNTER — Encounter (INDEPENDENT_AMBULATORY_CARE_PROVIDER_SITE_OTHER): Payer: Self-pay | Admitting: General Surgery

## 2013-09-22 ENCOUNTER — Ambulatory Visit (INDEPENDENT_AMBULATORY_CARE_PROVIDER_SITE_OTHER): Payer: Medicare Other | Admitting: General Surgery

## 2013-09-22 VITALS — BP 130/84 | HR 76 | Temp 96.8°F | Resp 16 | Ht 75.0 in | Wt 258.8 lb

## 2013-09-22 DIAGNOSIS — Z85038 Personal history of other malignant neoplasm of large intestine: Secondary | ICD-10-CM

## 2013-09-22 DIAGNOSIS — K621 Rectal polyp: Secondary | ICD-10-CM | POA: Diagnosis not present

## 2013-09-22 DIAGNOSIS — K620 Anal polyp: Secondary | ICD-10-CM | POA: Diagnosis not present

## 2013-09-22 DIAGNOSIS — D128 Benign neoplasm of rectum: Secondary | ICD-10-CM | POA: Diagnosis not present

## 2013-09-22 DIAGNOSIS — K649 Unspecified hemorrhoids: Secondary | ICD-10-CM | POA: Diagnosis not present

## 2013-09-22 DIAGNOSIS — D126 Benign neoplasm of colon, unspecified: Secondary | ICD-10-CM | POA: Diagnosis not present

## 2013-09-22 NOTE — Progress Notes (Signed)
Gary Frederick is a 57 y.o. male who is here for a follow up visit regarding his stage 2 colon cancer.  His surgery was last Jan.  He denies any new abd pain, weight loss, change in appetite, or blood per rectum.  He had his colonoscopy today, which showed 5 small polyps and a patent anastomosis.     Objective: Filed Vitals:   09/22/13 1203  BP: 130/84  Pulse: 76  Temp: 96.8 F (36 C)  Resp: 16    General appearance: alert and cooperative Resp: clear to auscultation bilaterally Cardio: regular rate and rhythm GI: soft, non-tender; bowel sounds normal; no masses,  no organomegaly No incisional hernia  Last CEA Lab Results  Component Value Date   CEA 4.3 06/19/2013     Assessment and Plan: Gary Frederick is a 57 y.o. M with stage 2 colon cancer, s/p resection in Jan 2014.  He reports no new symptoms and his colonoscopy was neg for any recurrence.  I will get a CEA today.  He did have a liver lesion that was biopsied during surgery and showed granulomas.  Repeat CT scan 6 mo later showed no progression.  He will see Dr Truett Perna back in Jan.  I will leave any decisions for repeat imaging up to him.  I will see him back next April for follow up.      Vanita Panda, MD [facility], [place] [phone]

## 2013-09-22 NOTE — Patient Instructions (Signed)
I will see you back in 6 months 

## 2013-09-23 ENCOUNTER — Encounter (INDEPENDENT_AMBULATORY_CARE_PROVIDER_SITE_OTHER): Payer: Self-pay

## 2013-09-23 ENCOUNTER — Telehealth (INDEPENDENT_AMBULATORY_CARE_PROVIDER_SITE_OTHER): Payer: Self-pay

## 2013-09-23 LAB — CEA: CEA: 4.8 ng/mL (ref 0.0–5.0)

## 2013-09-23 NOTE — Telephone Encounter (Signed)
Pt returned call and was given CEA result.

## 2013-09-23 NOTE — Telephone Encounter (Signed)
Left message for pt to call and get result below.

## 2013-09-23 NOTE — Telephone Encounter (Signed)
Message copied by Ivory Broad on Wed Sep 23, 2013 11:32 AM ------      Message from: [place], Broadus John.      Created: Wed Sep 23, 2013  9:52 AM      Regarding: results notification       Please let him know his CEA was 4.8 (stable)            AT      ----- Message -----         From: Lab In Three Zero Five Interface         Sent: 09/23/2013   7:40 AM           To: Romie Levee, MD                   ------

## 2013-10-06 DIAGNOSIS — E119 Type 2 diabetes mellitus without complications: Secondary | ICD-10-CM | POA: Diagnosis not present

## 2013-10-06 DIAGNOSIS — I10 Essential (primary) hypertension: Secondary | ICD-10-CM | POA: Diagnosis not present

## 2013-11-03 DIAGNOSIS — I10 Essential (primary) hypertension: Secondary | ICD-10-CM | POA: Diagnosis not present

## 2013-11-03 DIAGNOSIS — E119 Type 2 diabetes mellitus without complications: Secondary | ICD-10-CM | POA: Diagnosis not present

## 2013-12-01 ENCOUNTER — Telehealth: Payer: Self-pay | Admitting: *Deleted

## 2013-12-01 NOTE — Telephone Encounter (Signed)
sw pt informed pt that GBS will be on call 12/29/13. gv appt for 01/05/14 w/ labs@ 2:45p and ov@ 3:15pm. Pt is aware.Marland Kitchentd

## 2013-12-10 DIAGNOSIS — E119 Type 2 diabetes mellitus without complications: Secondary | ICD-10-CM | POA: Diagnosis not present

## 2013-12-10 DIAGNOSIS — N508 Other specified disorders of male genital organs: Secondary | ICD-10-CM | POA: Diagnosis not present

## 2013-12-10 DIAGNOSIS — R0602 Shortness of breath: Secondary | ICD-10-CM | POA: Diagnosis not present

## 2013-12-28 ENCOUNTER — Other Ambulatory Visit: Payer: Self-pay | Admitting: Family Medicine

## 2013-12-28 ENCOUNTER — Ambulatory Visit
Admission: RE | Admit: 2013-12-28 | Discharge: 2013-12-28 | Disposition: A | Discharge status: Home / Self Care | Payer: Medicare Other | Source: Ambulatory Visit | Attending: Family Medicine | Admitting: Family Medicine

## 2013-12-28 DIAGNOSIS — R05 Cough: Secondary | ICD-10-CM | POA: Diagnosis not present

## 2013-12-28 DIAGNOSIS — R0602 Shortness of breath: Secondary | ICD-10-CM | POA: Diagnosis not present

## 2013-12-28 DIAGNOSIS — R059 Cough, unspecified: Secondary | ICD-10-CM | POA: Diagnosis not present

## 2013-12-29 ENCOUNTER — Other Ambulatory Visit: Payer: Medicare Other

## 2013-12-29 ENCOUNTER — Ambulatory Visit: Payer: Medicare Other | Admitting: Oncology

## 2013-12-31 DIAGNOSIS — E119 Type 2 diabetes mellitus without complications: Secondary | ICD-10-CM | POA: Diagnosis not present

## 2014-01-05 ENCOUNTER — Ambulatory Visit (HOSPITAL_BASED_OUTPATIENT_CLINIC_OR_DEPARTMENT_OTHER): Payer: Medicare Other | Admitting: Nurse Practitioner

## 2014-01-05 ENCOUNTER — Other Ambulatory Visit: Payer: Medicare Other

## 2014-01-05 VITALS — BP 139/90 | HR 89 | Temp 97.6°F | Resp 18 | Ht 75.0 in | Wt 258.8 lb

## 2014-01-05 DIAGNOSIS — L989 Disorder of the skin and subcutaneous tissue, unspecified: Secondary | ICD-10-CM

## 2014-01-05 DIAGNOSIS — F17200 Nicotine dependence, unspecified, uncomplicated: Secondary | ICD-10-CM

## 2014-01-05 DIAGNOSIS — C184 Malignant neoplasm of transverse colon: Secondary | ICD-10-CM | POA: Diagnosis not present

## 2014-01-05 DIAGNOSIS — K7689 Other specified diseases of liver: Secondary | ICD-10-CM

## 2014-01-05 DIAGNOSIS — C189 Malignant neoplasm of colon, unspecified: Secondary | ICD-10-CM | POA: Diagnosis not present

## 2014-01-05 NOTE — Progress Notes (Signed)
Per request of L. Marcello Moores NP, a voice message was left with Dr. Benson Norway stating that, per request of Dr.Sherrill, patient should have follow-up colonoscopy at year 3 (from diagnosis) which would be around October 2016.  Message also stated that Dr. Benay Spice has reviewed the report for the 1 year (from diagnosis).  Dr. Benay Spice does not feel the patient should wait until year 5 (from diagnosis).  Message left with Dr. Benson Norway to page Dr.Sherrill if he has any questions.  Pager number was left in the message.

## 2014-01-05 NOTE — Progress Notes (Signed)
OFFICE PROGRESS NOTE  Interval history:  Gary Frederick returns for followup of colon cancer. He feels well. No change in bowel habits. No bloody or black stools. He denies abdominal pain. He has a good appetite. No shortness of breath. He continues to smoke. He has been smoking since age 58. He currently smokes one pack per day.  Objective: Filed Vitals:   01/05/14 1514  BP: 139/90  Pulse: 89  Temp: 97.6 F (36.4 C)  Resp: 18   Oropharynx is without thrush or ulceration. No palpable cervical, supraclavicular, axillary or inguinal lymph nodes. Lungs are clear. No wheezes or rales. Regular cardiac rhythm. Abdomen soft and nontender. No organomegaly. No leg edema.   Lab Results: Lab Results  Component Value Date   WBC 8.9 12/14/2012   HGB 12.6* 12/14/2012   HCT 37.1* 12/14/2012   MCV 90.9 12/14/2012   PLT 186 12/14/2012    Chemistry:    Chemistry      Component Value Date/Time   NA 137 12/14/2012 0515   K 3.4* 12/14/2012 0515   CL 99 12/14/2012 0515   CO2 29 12/14/2012 0515   BUN 7 12/14/2012 0515   CREATININE 0.91 03/25/2013 1512   CREATININE 0.78 12/14/2012 0515      Component Value Date/Time   CALCIUM 8.9 12/14/2012 0515       Studies/Results: Dg Chest 2 View  12/28/2013   CLINICAL DATA:  Shortness of breath, smoker, cough.  EXAM: CHEST  2 VIEW  COMPARISON:  CT CHEST W/CM dated 10/27/2012; DG CHEST 2 VIEW dated 04/06/2011  FINDINGS: Trachea is midline. Heart size is normal. Calcified mediastinal and hilar lymph nodes are better seen on 10/27/2012. Lungs are clear. No pleural fluid.  IMPRESSION: No active cardiopulmonary disease.   Electronically Signed   By: Lorin Picket M.D.   On: 12/28/2013 13:43    Medications: I have reviewed the patient's current medications.  Assessment/Plan: 1. Adenocarcinoma transverse colon , status post a right colectomy on 12/11/2012, stage II (T3 N0) well differentiated adenocarcinoma, the tumor is microsatellite stable and no loss of expression  of mismatch repair proteins. 2.  Indeterminate right liver lesion on a CT 10/13/2012, status post intraoperative ultrasound and biopsy on 12/11/2012 with no malignancy found; stable liver on a CT of the abdomen May 2014.  3. Mildly elevated preoperative CEA. 4. Gout.  5. Cutaneous nodular lesions-likely sebaceous cysts versus sarcoidosis.  6. Calcified chest lymph nodes on a CT 10/27/2012, "granuloma "found on the surgical pathology from the liver biopsy 12/11/2012-? Sarcoidosis. The ACE level was not elevated on 01/13/2013. 7. Tobacco use. Information on smoking cessation was provided at today's visit. He was encouraged to stop smoking. 8. Colonoscopy 09/22/2013. 5 sessile polyps were found in the rectum and in the transverse colon. The polyps 2-3 mm in size. External and internal hemorrhoids.  Dispositon-Gary Frederick remains in clinical remission from colon cancer. We will followup on the CEA from today. He will return for a followup visit and CEA in one year. He will contact the office in the interim with any problems.  Plan reviewed with Dr. Benay Spice.   Ned Card ANP/GNP-BC

## 2014-01-06 ENCOUNTER — Encounter: Payer: Self-pay | Admitting: *Deleted

## 2014-01-06 ENCOUNTER — Telehealth: Payer: Self-pay | Admitting: Oncology

## 2014-01-06 LAB — CEA: CEA: 5.3 ng/mL — AB (ref 0.0–5.0)

## 2014-01-06 NOTE — Telephone Encounter (Signed)
s.w. pt and advised on Feb 2016 appt ....pt ok and aware °

## 2014-01-07 ENCOUNTER — Other Ambulatory Visit: Payer: Self-pay | Admitting: *Deleted

## 2014-01-07 ENCOUNTER — Telehealth: Payer: Self-pay | Admitting: *Deleted

## 2014-01-07 DIAGNOSIS — C189 Malignant neoplasm of colon, unspecified: Secondary | ICD-10-CM

## 2014-01-07 NOTE — Telephone Encounter (Signed)
Notified patient of slight increase in CEA and to recheck in 2 months. He understands and agrees. POF to scheduler.

## 2014-01-07 NOTE — Telephone Encounter (Signed)
Message copied by Tania Ade on Thu Jan 07, 2014  4:09 PM ------      Message from: Betsy Coder B      Created: Wed Jan 06, 2014  6:05 PM       Please call patient, CEA mildly elevated, may be normal variation, repeat CEA 2  months ------

## 2014-01-08 ENCOUNTER — Telehealth: Payer: Self-pay | Admitting: Oncology

## 2014-01-08 NOTE — Telephone Encounter (Signed)
Called pt and left message , mailed appt to pt for April 2015

## 2014-01-12 ENCOUNTER — Telehealth: Payer: Self-pay | Admitting: Oncology

## 2014-01-12 NOTE — Telephone Encounter (Signed)
Gave pt appt for lab  for April and February 2016 lab and MD

## 2014-01-21 DIAGNOSIS — I10 Essential (primary) hypertension: Secondary | ICD-10-CM | POA: Diagnosis not present

## 2014-01-21 DIAGNOSIS — E1110 Type 2 diabetes mellitus with ketoacidosis without coma: Secondary | ICD-10-CM | POA: Diagnosis not present

## 2014-02-04 DIAGNOSIS — E119 Type 2 diabetes mellitus without complications: Secondary | ICD-10-CM | POA: Diagnosis not present

## 2014-02-04 DIAGNOSIS — I10 Essential (primary) hypertension: Secondary | ICD-10-CM | POA: Diagnosis not present

## 2014-02-11 DIAGNOSIS — E119 Type 2 diabetes mellitus without complications: Secondary | ICD-10-CM | POA: Diagnosis not present

## 2014-02-15 DIAGNOSIS — H5200 Hypermetropia, unspecified eye: Secondary | ICD-10-CM | POA: Diagnosis not present

## 2014-02-15 DIAGNOSIS — H524 Presbyopia: Secondary | ICD-10-CM | POA: Diagnosis not present

## 2014-02-15 DIAGNOSIS — H40019 Open angle with borderline findings, low risk, unspecified eye: Secondary | ICD-10-CM | POA: Diagnosis not present

## 2014-02-15 DIAGNOSIS — H52229 Regular astigmatism, unspecified eye: Secondary | ICD-10-CM | POA: Diagnosis not present

## 2014-02-16 ENCOUNTER — Other Ambulatory Visit: Payer: Self-pay | Admitting: Family Medicine

## 2014-02-16 DIAGNOSIS — R51 Headache: Principal | ICD-10-CM

## 2014-02-16 DIAGNOSIS — R519 Headache, unspecified: Secondary | ICD-10-CM

## 2014-02-17 ENCOUNTER — Ambulatory Visit
Admission: RE | Admit: 2014-02-17 | Discharge: 2014-02-17 | Disposition: A | Discharge status: Home / Self Care | Payer: Medicare Other | Source: Ambulatory Visit | Attending: Family Medicine | Admitting: Family Medicine

## 2014-02-17 DIAGNOSIS — R519 Headache, unspecified: Secondary | ICD-10-CM

## 2014-02-17 DIAGNOSIS — R51 Headache: Principal | ICD-10-CM

## 2014-02-17 DIAGNOSIS — H538 Other visual disturbances: Secondary | ICD-10-CM | POA: Diagnosis not present

## 2014-03-08 DIAGNOSIS — E119 Type 2 diabetes mellitus without complications: Secondary | ICD-10-CM | POA: Diagnosis not present

## 2014-03-08 DIAGNOSIS — I10 Essential (primary) hypertension: Secondary | ICD-10-CM | POA: Diagnosis not present

## 2014-03-11 ENCOUNTER — Other Ambulatory Visit (HOSPITAL_BASED_OUTPATIENT_CLINIC_OR_DEPARTMENT_OTHER): Payer: Medicare Other

## 2014-03-11 DIAGNOSIS — C189 Malignant neoplasm of colon, unspecified: Secondary | ICD-10-CM | POA: Diagnosis not present

## 2014-03-11 DIAGNOSIS — C184 Malignant neoplasm of transverse colon: Secondary | ICD-10-CM

## 2014-03-11 LAB — CEA: CEA: 5.6 ng/mL — AB (ref 0.0–5.0)

## 2014-03-15 ENCOUNTER — Telehealth: Payer: Self-pay | Admitting: *Deleted

## 2014-03-15 DIAGNOSIS — C189 Malignant neoplasm of colon, unspecified: Secondary | ICD-10-CM

## 2014-03-15 NOTE — Telephone Encounter (Signed)
Per Dr. Benay Spice, informed pt that his cea is mildly elevated, from 5.3 to 5.6.  Pt did state that he is smoking; encouraged smoking cessation.  Informed pt that MD wants to re-check lab in 2 months, and if higher office will schedule CT.  Pt verbalized understanding of information.

## 2014-03-15 NOTE — Telephone Encounter (Signed)
Message copied by Domenic Schwab on Mon Mar 15, 2014 10:37 AM ------      Message from: Ladell Pier      Created: Fri Mar 12, 2014  3:53 PM       Please call patient, cea is mildly elevated, ? Is he smoking      Repeat 89mo., if higher we will schedule CTs ------

## 2014-03-16 ENCOUNTER — Encounter (INDEPENDENT_AMBULATORY_CARE_PROVIDER_SITE_OTHER): Payer: Self-pay | Admitting: General Surgery

## 2014-03-16 ENCOUNTER — Ambulatory Visit (INDEPENDENT_AMBULATORY_CARE_PROVIDER_SITE_OTHER): Payer: Medicare Other | Admitting: General Surgery

## 2014-03-16 VITALS — BP 130/72 | HR 76 | Temp 98.4°F | Resp 18 | Ht 73.0 in | Wt 257.0 lb

## 2014-03-16 DIAGNOSIS — C189 Malignant neoplasm of colon, unspecified: Secondary | ICD-10-CM

## 2014-03-16 NOTE — Progress Notes (Signed)
JEYSON DESHOTEL is a 58 y.o. male who is here for a follow up visit regarding his stage 2 colon cancer. His surgery was Jan 2014. He denies any new abd pain, weight loss, change in appetite, or blood per rectum. He had his colonoscopy Nov 2014, which showed 5 small polyps and a patent anastomosis.   He does complain of some mild constipation with an addition of a diabetic medication.   Objective:  Filed Vitals:   03/16/14 1235  BP: 130/72  Pulse: 76  Temp: 98.4 F (36.9 C)  Resp: 18    General appearance: alert and cooperative  Resp: clear to auscultation bilaterally  Cardio: regular rate and rhythm  GI: soft, non-tender; bowel sounds normal; no masses, no organomegaly  No incisional hernia  Lab Results  Component Value Date   CEA 5.6* 03/11/2014    Assessment and Plan:  ARTEMIO DOBIE is a 58 y.o. M with stage 2 colon cancer, s/p resection in Jan 2014. He reports no new symptoms and his colonoscopy was neg for any recurrence.  His CEA level has risen slightly over the past few months. Dr. Gearldine Shown plan is to repeat this in 2 months. He has counseled the patient to stop smoking as well.  I will await his next level. Patient may follow up with me in 6 months.

## 2014-03-16 NOTE — Patient Instructions (Signed)
Return to the office in 6 months.  We strongly recommend that you stop smoking.  Smoking increases the risk of surgery including infection in the form of an open wound, pus formation, abscess, hernia at an incision on the abdomen, etc.  You have an increased risk of other MAJOR complications such as stroke, heart attack, forming clots in the leg and/or lungs, and death.    While it can be one of the most difficult things to do, the [place] community has programs to help you stop.  Consider talking with your primary care physician about options.  Also, Smoking Cessation classes are available through the [facility]:  The smoking cessation program is a proven-effective program from the American [employer]. The program is available for anyone 33 and older who currently smokes. The program lasts for 7 weeks and is 8 sessions. Each class will be approximately 1 1/2 hours. The program is every Tuesday.  All classes are 12-1:30pm and same location.  Event Location Information:  Location: [place] 2nd Floor Conference Room 2-037; located next to [facility] cross streets: [place] Entrance into the [facility] is adjacent to the [employer] main entrance. The conference room is located on the 2nd floor.  Parking Instructions: Visitor parking is adjacent to [employer] main entrance and the [employer] [phone] or check the Classes and Support Groups   [email or web address] the event of inclemet weather please call [phone] or view online at [email or web address]

## 2014-03-18 ENCOUNTER — Telehealth: Payer: Self-pay | Admitting: Oncology

## 2014-03-18 NOTE — Telephone Encounter (Signed)
S/w the pt and he is aware of his lab appt on 05/06/2014@3 :30pm.

## 2014-04-12 DIAGNOSIS — E119 Type 2 diabetes mellitus without complications: Secondary | ICD-10-CM | POA: Diagnosis not present

## 2014-04-12 DIAGNOSIS — I10 Essential (primary) hypertension: Secondary | ICD-10-CM | POA: Diagnosis not present

## 2014-05-06 ENCOUNTER — Other Ambulatory Visit (HOSPITAL_BASED_OUTPATIENT_CLINIC_OR_DEPARTMENT_OTHER): Payer: Medicare Other

## 2014-05-06 DIAGNOSIS — C184 Malignant neoplasm of transverse colon: Secondary | ICD-10-CM | POA: Diagnosis not present

## 2014-05-06 DIAGNOSIS — C189 Malignant neoplasm of colon, unspecified: Secondary | ICD-10-CM

## 2014-05-07 ENCOUNTER — Telehealth: Payer: Self-pay | Admitting: *Deleted

## 2014-05-07 LAB — CEA: CEA: 4.9 ng/mL (ref 0.0–5.0)

## 2014-05-07 NOTE — Telephone Encounter (Signed)
Left VM to call office regarding lab results.

## 2014-05-07 NOTE — Telephone Encounter (Signed)
Message copied by Tania Ade on Fri May 07, 2014  4:38 PM ------      Message from: Gary Frederick      Created: Fri May 07, 2014  7:46 AM       Please call patient, CEA is in upper normal range, stable, f/u 65months, encourage him to stop smoking ------

## 2014-05-10 ENCOUNTER — Telehealth: Payer: Self-pay | Admitting: *Deleted

## 2014-05-10 NOTE — Telephone Encounter (Signed)
Pt returned call, lab results given. He voiced understanding. Encouraged pt to quit smoking. He reports he has slowed down and is trying. Offered pt smoking cessation materials, pt requested I mail them to his home.

## 2014-05-11 ENCOUNTER — Telehealth: Payer: Self-pay | Admitting: *Deleted

## 2014-05-11 NOTE — Telephone Encounter (Signed)
Clarified follow up instructions with Dr. Benay Spice: Check CEA in 4 mos. Keep 01/2015 appts as scheduled. Order sent to schedulers. Smoking cessation info mailed to pt.

## 2014-05-12 ENCOUNTER — Telehealth: Payer: Self-pay | Admitting: Oncology

## 2014-05-12 DIAGNOSIS — B353 Tinea pedis: Secondary | ICD-10-CM | POA: Diagnosis not present

## 2014-05-12 DIAGNOSIS — I10 Essential (primary) hypertension: Secondary | ICD-10-CM | POA: Diagnosis not present

## 2014-05-12 DIAGNOSIS — E119 Type 2 diabetes mellitus without complications: Secondary | ICD-10-CM | POA: Diagnosis not present

## 2014-05-12 NOTE — Telephone Encounter (Signed)
s.w. pt and advised on OCT and Feb appt....pt ok adn aware

## 2014-07-07 DIAGNOSIS — E119 Type 2 diabetes mellitus without complications: Secondary | ICD-10-CM | POA: Diagnosis not present

## 2014-07-07 DIAGNOSIS — R7309 Other abnormal glucose: Secondary | ICD-10-CM | POA: Diagnosis not present

## 2014-08-11 DIAGNOSIS — E119 Type 2 diabetes mellitus without complications: Secondary | ICD-10-CM | POA: Diagnosis not present

## 2014-09-09 ENCOUNTER — Other Ambulatory Visit (HOSPITAL_BASED_OUTPATIENT_CLINIC_OR_DEPARTMENT_OTHER): Payer: Medicare Other

## 2014-09-09 DIAGNOSIS — C189 Malignant neoplasm of colon, unspecified: Secondary | ICD-10-CM | POA: Diagnosis not present

## 2014-09-10 LAB — CEA: CEA: 4.6 ng/mL (ref 0.0–5.0)

## 2014-09-14 ENCOUNTER — Telehealth: Payer: Self-pay | Admitting: *Deleted

## 2014-09-14 NOTE — Telephone Encounter (Signed)
Message copied by Wardell Heath on Tue Sep 14, 2014 12:36 PM ------      Message from: Ladell Pier      Created: Mon Sep 13, 2014  4:35 PM       Please call patient, cea is normal, f/u as scheduled ------

## 2014-09-14 NOTE — Telephone Encounter (Signed)
Called and informed patient of normal cea and to follow up as scheduled. Per Dr. Benay Spice.  Patient verbalized understanding.

## 2014-09-22 DIAGNOSIS — I10 Essential (primary) hypertension: Secondary | ICD-10-CM | POA: Diagnosis not present

## 2014-09-22 DIAGNOSIS — E1165 Type 2 diabetes mellitus with hyperglycemia: Secondary | ICD-10-CM | POA: Diagnosis not present

## 2014-09-22 DIAGNOSIS — Z23 Encounter for immunization: Secondary | ICD-10-CM | POA: Diagnosis not present

## 2014-09-22 DIAGNOSIS — E785 Hyperlipidemia, unspecified: Secondary | ICD-10-CM | POA: Diagnosis not present

## 2014-10-07 DIAGNOSIS — C182 Malignant neoplasm of ascending colon: Secondary | ICD-10-CM | POA: Diagnosis not present

## 2014-11-22 IMAGING — CT CT CHEST W/ CM
2 of 3 series · 15 of 30 positions shown, 17 images · IV contrast (75CC OMNI 300)
Comparison: Multiple exams, including 06/28/2011 and 02/14/2010

CLINICAL DATA: Colon cancer.

CT CHEST WITH CONTRAST
TECHNIQUE: Multidetector CT imaging of the chest was performed
following the standard protocol during bolus administration of
intravenous contrast.
Contrast: 75mL OMNIPAQUE IOHEXOL 300 MG/ML  SOLN

[Series 3: chest with · axial · 0.78mm/px · z∈[-278,-8]mm · 7 of 73 slices shown, 9 images]
[im 10/73  mediastinal]
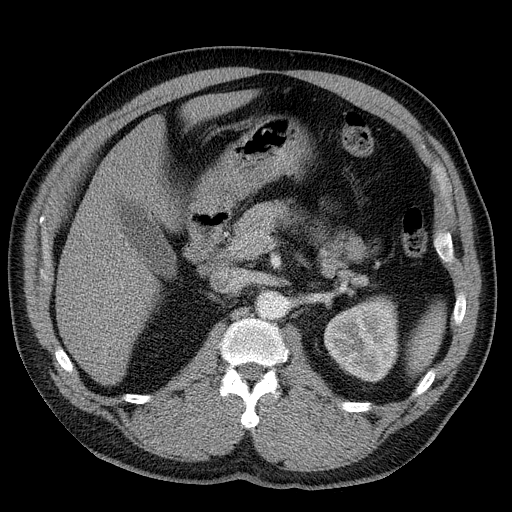
[im 10/73  lung]
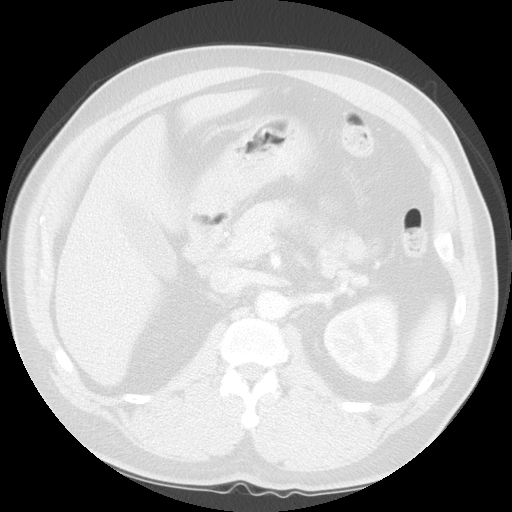
[im 19/73  lung]
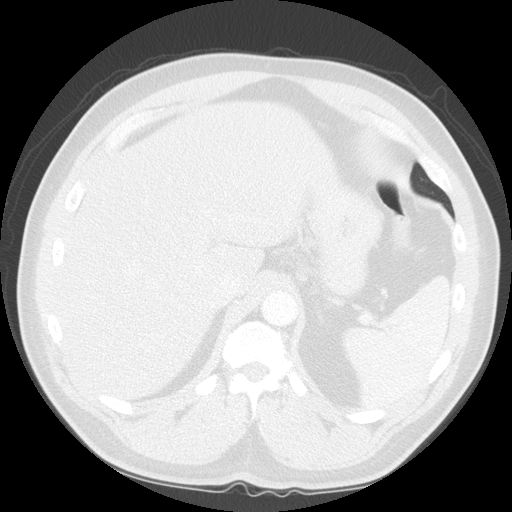
[im 28/73  lung]
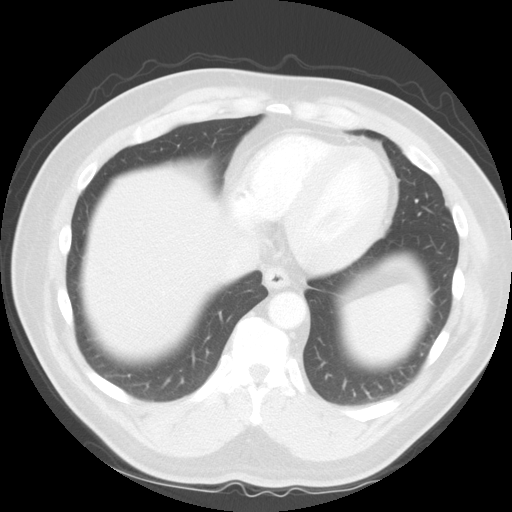
[im 37/73  lung]
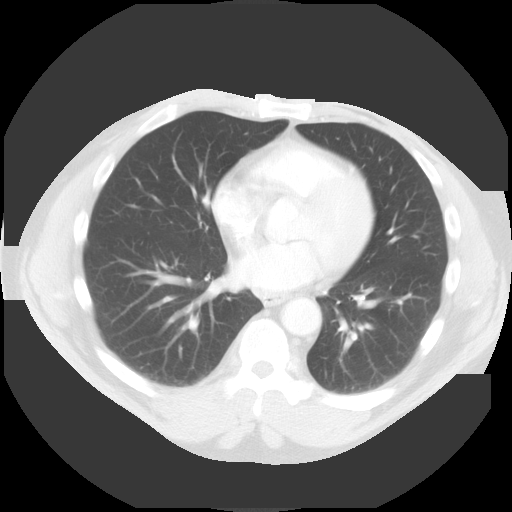
[im 46/73  mediastinal]
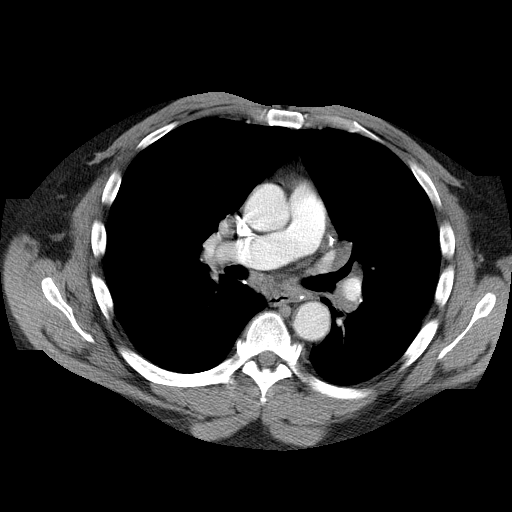
[im 46/73  lung]
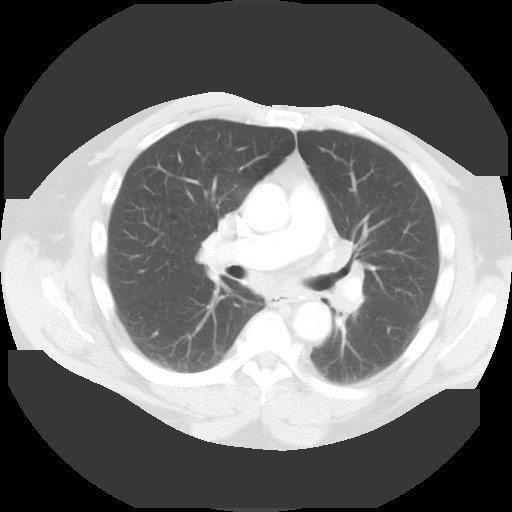
[im 55/73  lung]
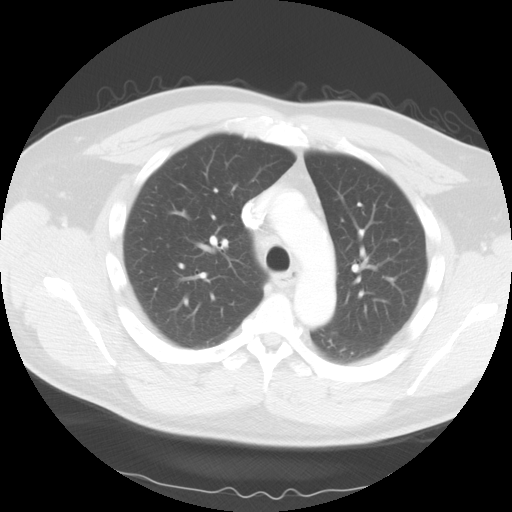
[im 64/73  lung]
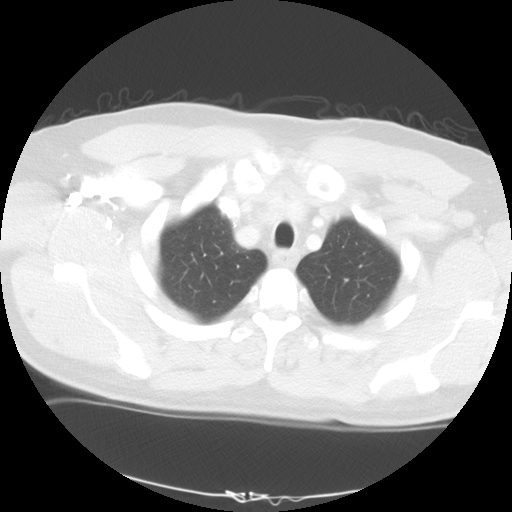

[Series 602: sagittal body · sagittal · 0.78mm/px · 8 of 160 slices shown]
[im 18/160  mediastinal]
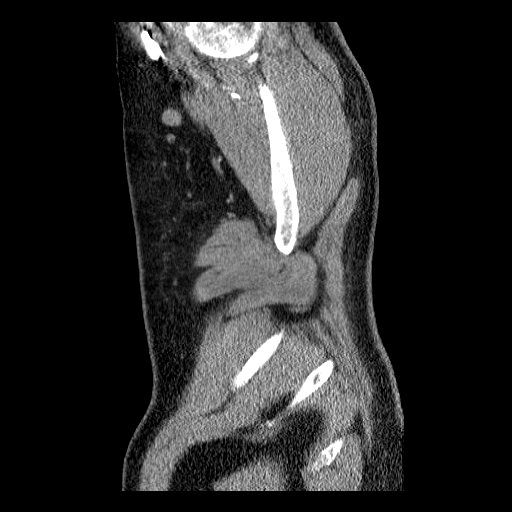
[im 36/160  mediastinal]
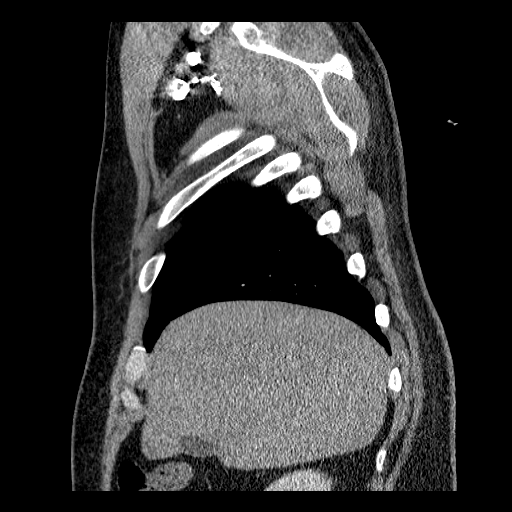
[im 54/160  mediastinal]
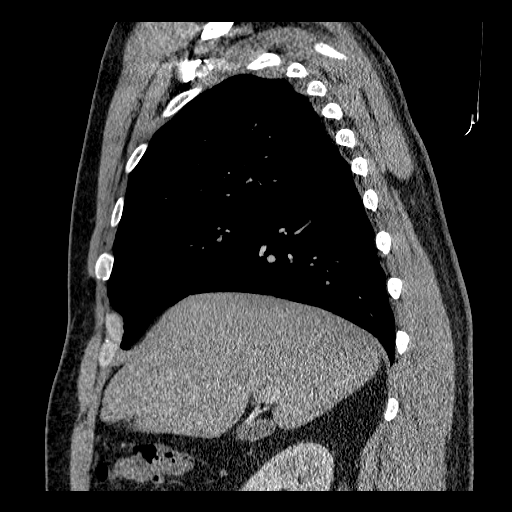
[im 71/160  mediastinal]
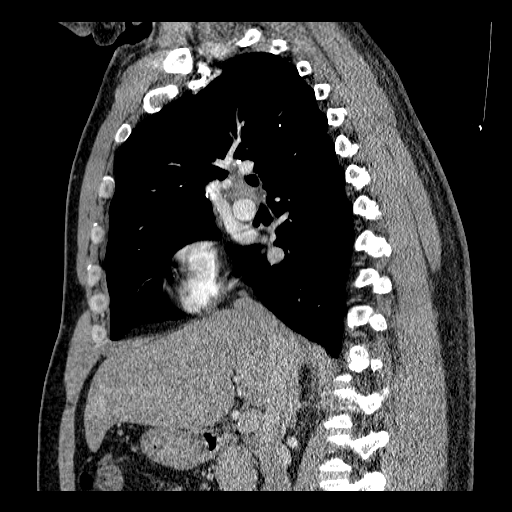
[im 89/160  mediastinal]
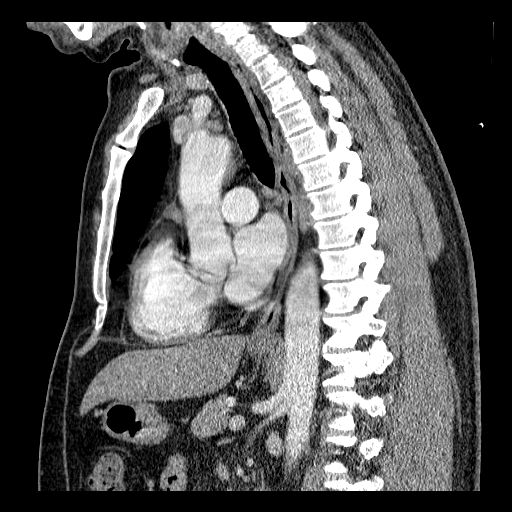
[im 107/160  mediastinal]
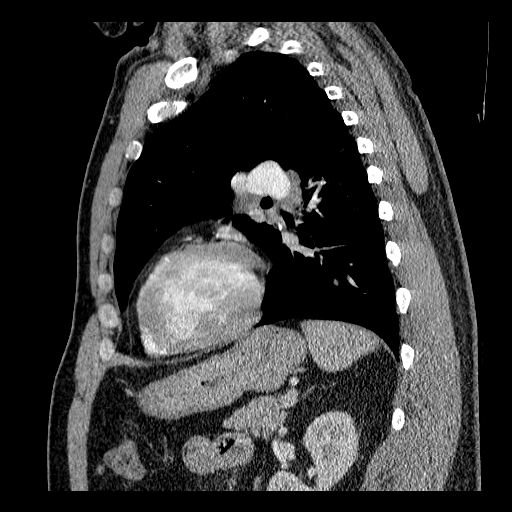
[im 124/160  mediastinal]
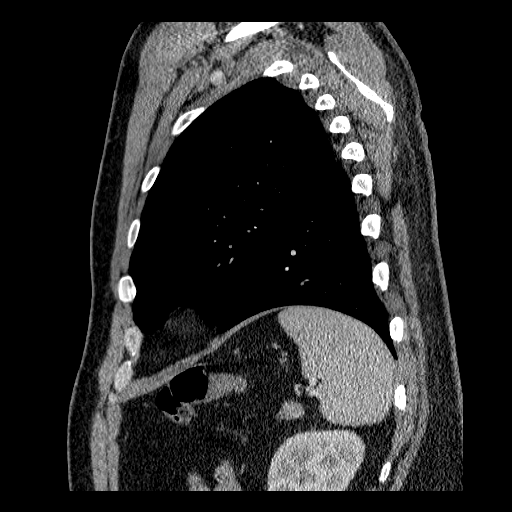
[im 142/160  mediastinal]
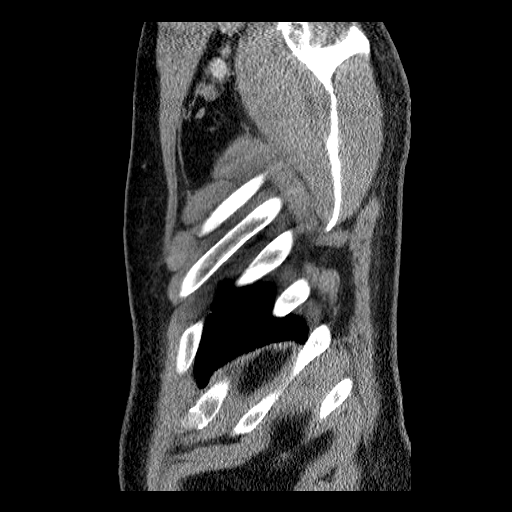

[15 of 30 positions shown; findings below may reference images not displayed]

FINDINGS: Prevascular node 1.0 cm in short axis, image 14 of series
3.

Right paratracheal node, 1 cm in short axis, image 17 of series 3.

Right lower paratracheal node, partially calcified, 1.5 cm in short
axis on image 23 of series 3.

Right hilar node, 1.6 cm in short axis, image 28 of series 3.

AP window node, 1.1 cm in short axis, partially calcified, image 24
of series 3.

Mild bilateral calcified infrahilar adenopathy and mild left hilar
adenopathy noted.  Subcarinal node short axis 1.4 cm, image 31 of
series 3. Previously seen lesion in segment 5 is relatively
inconspicuous on today's late arterial phase images.

Edema is present at the root the mesentery.  Hypodense lesion right
kidney upper pole, likely a cyst.

Interventricular septum thickness 1.7 cm, suspicious for left
ventricular hypertrophy.  Coronary artery atherosclerotic
calcification observed.  Hepatic steatosis suspected.

0.4 x 0.4 x 0.5 cm nodule noted in the right upper lobe on image 52
of series 601, stable from prior exam of 02/14/2010.

[DATE] mm nodule in the right lower lobe, image 38 of series 4, likewise
stable.

3 mm nodule, lingula, and image 42 of series 4, stable.
IMPRESSION: 1.  Mediastinal and hilar adenopathy with scattered calcifications,
similar back through 7224, most compatible with prior granulomatous
disease.
2.  Interventricular septal thickness of 1.7 cm, suspicious for
left ventricular hypertrophy.
3.  Mild coronary artery atherosclerotic calcification.
4.  Hepatic steatosis.

5.  Several old tiny nodules are stable over the past several years
and considered benign.
4.  Edema noted at the root the mesentery on the upper abdominal
images.

## 2014-12-21 DIAGNOSIS — E118 Type 2 diabetes mellitus with unspecified complications: Secondary | ICD-10-CM | POA: Diagnosis not present

## 2014-12-21 DIAGNOSIS — I10 Essential (primary) hypertension: Secondary | ICD-10-CM | POA: Diagnosis not present

## 2014-12-21 DIAGNOSIS — E785 Hyperlipidemia, unspecified: Secondary | ICD-10-CM | POA: Diagnosis not present

## 2014-12-28 ENCOUNTER — Other Ambulatory Visit: Payer: Self-pay | Admitting: *Deleted

## 2015-01-04 ENCOUNTER — Other Ambulatory Visit: Payer: Medicare Other

## 2015-01-05 ENCOUNTER — Other Ambulatory Visit: Payer: Self-pay | Admitting: Nurse Practitioner

## 2015-01-05 DIAGNOSIS — C189 Malignant neoplasm of colon, unspecified: Secondary | ICD-10-CM

## 2015-01-06 ENCOUNTER — Telehealth: Payer: Self-pay | Admitting: Oncology

## 2015-01-06 ENCOUNTER — Other Ambulatory Visit: Payer: Medicare Other

## 2015-01-06 ENCOUNTER — Ambulatory Visit (HOSPITAL_BASED_OUTPATIENT_CLINIC_OR_DEPARTMENT_OTHER): Payer: Medicare Other | Admitting: Nurse Practitioner

## 2015-01-06 VITALS — BP 140/99 | HR 73 | Temp 98.4°F | Resp 20 | Ht 73.0 in | Wt 254.9 lb

## 2015-01-06 DIAGNOSIS — L989 Disorder of the skin and subcutaneous tissue, unspecified: Secondary | ICD-10-CM | POA: Diagnosis not present

## 2015-01-06 DIAGNOSIS — C189 Malignant neoplasm of colon, unspecified: Secondary | ICD-10-CM

## 2015-01-06 DIAGNOSIS — C184 Malignant neoplasm of transverse colon: Secondary | ICD-10-CM

## 2015-01-06 DIAGNOSIS — K769 Liver disease, unspecified: Secondary | ICD-10-CM

## 2015-01-06 DIAGNOSIS — Z720 Tobacco use: Secondary | ICD-10-CM | POA: Diagnosis not present

## 2015-01-06 DIAGNOSIS — R970 Elevated carcinoembryonic antigen [CEA]: Secondary | ICD-10-CM

## 2015-01-06 DIAGNOSIS — R030 Elevated blood-pressure reading, without diagnosis of hypertension: Secondary | ICD-10-CM | POA: Diagnosis not present

## 2015-01-06 LAB — CEA: CEA: 5.1 ng/mL — ABNORMAL HIGH (ref 0.0–5.0)

## 2015-01-06 NOTE — Progress Notes (Signed)
  [place] OFFICE PROGRESS NOTE   Diagnosis:  Colon cancer  INTERVAL HISTORY:   Gary Frederick returns as scheduled. He feels well. No change in bowel habits. No bloody or black stools. He has a good appetite. No significant abdominal pain. He is trying to quit smoking. At present he estimates one half pack per day.  Objective:  Vital signs in last 24 hours:  Blood pressure 140/99, pulse 73, temperature 98.4 F (36.9 C), temperature source Oral, resp. rate 20, height $RemoveBe'6\' 1"'NZvUOrhpk$  (1.854 m), weight 254 lb 14.4 oz (115.622 kg), SpO2 99 %.    HEENT: No thrush or ulcers. Lymphatics: No palpable cervical, supra clavicular, axillary or inguinal lymph nodes. Resp: Lungs clear bilaterally. Cardio: Regular rate and rhythm. GI: Abdomen soft and nontender. No organomegaly. Vascular: No leg edema.    Lab Results:  Lab Results  Component Value Date   WBC 8.9 12/14/2012   HGB 12.6* 12/14/2012   HCT 37.1* 12/14/2012   MCV 90.9 12/14/2012   PLT 186 12/14/2012    Imaging:  No results found.  Medications: I have reviewed the patient's current medications.  Assessment/Plan: 1. Adenocarcinoma transverse colon , status post a right colectomy on 12/11/2012, stage II (T3 N0) well differentiated adenocarcinoma, the tumor is microsatellite stable and no loss of expression of mismatch repair proteins. 2. Indeterminate right liver lesion on a CT 10/13/2012, status post intraoperative ultrasound and biopsy on 12/11/2012 with no malignancy found; stable liver on a CT of the abdomen May 2014.  3. Mildly elevated preoperative CEA. 4. Gout.  5. Cutaneous nodular lesions-likely sebaceous cysts versus sarcoidosis.  6. Calcified chest lymph nodes on a CT 10/27/2012, "granuloma "found on the surgical pathology from the liver biopsy 12/11/2012-? Sarcoidosis. The ACE level was not elevated on 01/13/2013. 7. Tobacco use. He was encouraged to stop smoking. 8. Colonoscopy 09/22/2013. 5  sessile polyps were found in the rectum and in the transverse colon. The polyps 2-3 mm in size. External and internal hemorrhoids   Disposition: Gary Frederick appears stable. He remains in clinical remission from colon cancer. We will follow-up on the CEA from today.  The CEA has been mildly elevated in the past (5.3 on 01/05/2014 and 5.6 on 03/11/2014). Subsequent CEAs have been in normal range. The mild elevation may be related to smoking.  His blood pressure was elevated in the office today. He reports a normal blood pressure when checked at home. He will contact his PCP if his blood pressure remains elevated at home.  Gary Frederick will return for a CEA in 6 months and a follow-up visit and CEA in one year. He will contact the office in the interim with any problems.  Plan reviewed with Dr. Benay Spice.    Ned Card ANP/GNP-BC   01/06/2015  4:18 PM

## 2015-01-06 NOTE — Telephone Encounter (Signed)
gave pt avs report and appts for aug 2016 and feb 2017.

## 2015-01-12 ENCOUNTER — Telehealth: Payer: Self-pay

## 2015-01-12 NOTE — Telephone Encounter (Signed)
Called and informed pt CEA was stable to follow up as scheduled.

## 2015-03-22 DIAGNOSIS — E119 Type 2 diabetes mellitus without complications: Secondary | ICD-10-CM | POA: Diagnosis not present

## 2015-03-22 DIAGNOSIS — I10 Essential (primary) hypertension: Secondary | ICD-10-CM | POA: Diagnosis not present

## 2015-05-17 ENCOUNTER — Other Ambulatory Visit: Payer: Self-pay | Admitting: General Surgery

## 2015-05-17 DIAGNOSIS — Z85038 Personal history of other malignant neoplasm of large intestine: Secondary | ICD-10-CM

## 2015-05-20 ENCOUNTER — Ambulatory Visit
Admission: RE | Admit: 2015-05-20 | Discharge: 2015-05-20 | Disposition: A | Discharge status: Home / Self Care | Payer: Medicare Other | Source: Ambulatory Visit | Attending: General Surgery | Admitting: General Surgery

## 2015-05-20 DIAGNOSIS — Z9889 Other specified postprocedural states: Secondary | ICD-10-CM | POA: Diagnosis not present

## 2015-05-20 DIAGNOSIS — C189 Malignant neoplasm of colon, unspecified: Secondary | ICD-10-CM | POA: Diagnosis not present

## 2015-05-20 DIAGNOSIS — R918 Other nonspecific abnormal finding of lung field: Secondary | ICD-10-CM | POA: Diagnosis not present

## 2015-05-20 MED ORDER — IOPAMIDOL (ISOVUE-300) INJECTION 61%
125.0000 mL | Freq: Once | INTRAVENOUS | Status: AC | PRN
  Administered 2015-05-20: 125 mL via INTRAVENOUS

## 2015-07-06 DIAGNOSIS — N529 Male erectile dysfunction, unspecified: Secondary | ICD-10-CM | POA: Diagnosis not present

## 2015-07-06 DIAGNOSIS — I10 Essential (primary) hypertension: Secondary | ICD-10-CM | POA: Diagnosis not present

## 2015-07-06 DIAGNOSIS — E1100 Type 2 diabetes mellitus with hyperosmolarity without nonketotic hyperglycemic-hyperosmolar coma (NKHHC): Secondary | ICD-10-CM | POA: Diagnosis not present

## 2015-07-06 DIAGNOSIS — E119 Type 2 diabetes mellitus without complications: Secondary | ICD-10-CM | POA: Diagnosis not present

## 2015-07-07 ENCOUNTER — Other Ambulatory Visit (HOSPITAL_BASED_OUTPATIENT_CLINIC_OR_DEPARTMENT_OTHER): Payer: Medicare Other

## 2015-07-07 DIAGNOSIS — C189 Malignant neoplasm of colon, unspecified: Secondary | ICD-10-CM | POA: Diagnosis not present

## 2015-07-08 ENCOUNTER — Telehealth: Payer: Self-pay | Admitting: *Deleted

## 2015-07-08 LAB — CEA: CEA: 4.8 ng/mL (ref 0.0–5.0)

## 2015-07-08 NOTE — Telephone Encounter (Signed)
Called pt with CEA results. Stable, per Dr. Benay Spice. He voiced appreciation for call.

## 2015-07-08 NOTE — Telephone Encounter (Signed)
-----   Message from Ladell Pier, MD sent at 07/08/2015  4:28 PM EDT ----- Please call patient, cea is stable, f/u as scheduled

## 2015-09-06 DIAGNOSIS — E118 Type 2 diabetes mellitus with unspecified complications: Secondary | ICD-10-CM | POA: Diagnosis not present

## 2015-09-06 DIAGNOSIS — E785 Hyperlipidemia, unspecified: Secondary | ICD-10-CM | POA: Diagnosis not present

## 2015-11-07 DIAGNOSIS — Z85038 Personal history of other malignant neoplasm of large intestine: Secondary | ICD-10-CM | POA: Diagnosis not present

## 2015-11-14 DIAGNOSIS — E118 Type 2 diabetes mellitus with unspecified complications: Secondary | ICD-10-CM | POA: Diagnosis not present

## 2015-11-14 DIAGNOSIS — I10 Essential (primary) hypertension: Secondary | ICD-10-CM | POA: Diagnosis not present

## 2015-11-14 DIAGNOSIS — Z6833 Body mass index (BMI) 33.0-33.9, adult: Secondary | ICD-10-CM | POA: Diagnosis not present

## 2016-01-05 ENCOUNTER — Telehealth: Payer: Self-pay | Admitting: Oncology

## 2016-01-05 ENCOUNTER — Other Ambulatory Visit (HOSPITAL_BASED_OUTPATIENT_CLINIC_OR_DEPARTMENT_OTHER): Payer: Medicare Other

## 2016-01-05 ENCOUNTER — Ambulatory Visit (HOSPITAL_BASED_OUTPATIENT_CLINIC_OR_DEPARTMENT_OTHER): Payer: Medicare Other | Admitting: Oncology

## 2016-01-05 VITALS — BP 125/83 | HR 74 | Temp 98.2°F | Resp 19 | Wt 255.6 lb

## 2016-01-05 DIAGNOSIS — Z85048 Personal history of other malignant neoplasm of rectum, rectosigmoid junction, and anus: Secondary | ICD-10-CM | POA: Diagnosis not present

## 2016-01-05 DIAGNOSIS — Z720 Tobacco use: Secondary | ICD-10-CM

## 2016-01-05 DIAGNOSIS — C189 Malignant neoplasm of colon, unspecified: Secondary | ICD-10-CM

## 2016-01-05 DIAGNOSIS — C184 Malignant neoplasm of transverse colon: Secondary | ICD-10-CM

## 2016-01-05 NOTE — Progress Notes (Signed)
  [place] OFFICE PROGRESS NOTE   Diagnosis: Colon cancer  INTERVAL HISTORY:   Gary Frederick returns as scheduled. He feels well. No difficulty with bowel function. He is working. He smokes 1/2-1 pack of cigarette per day. He reports smoking since he was a teenager.  Objective:  Vital signs in last 24 hours:  Blood pressure 125/83, pulse 74, temperature 98.2 F (36.8 C), temperature source Oral, resp. rate 19, weight 255 lb 9.6 oz (115.939 kg), SpO2 99 %.    HEENT: Neck without mass Lymphatics: No cervical, supra-clavicular, axillary, or inguinal nodes. Resp: Lungs with scattered in inspiratory and expiratory rhonchi, no respiratory distress Cardio: Regular rate and rhythm GI: No hepatosplenomegaly, nontender, no mass Vascular: No leg edema  Skin: 1 cm cystic lesion at the right trapezius area (he reports this has been present for years)    Medications: I have reviewed the patient's current medications.  Assessment/Plan: 1. Adenocarcinoma transverse colon , status post a right colectomy on 12/11/2012, stage II (T3 N0) well differentiated adenocarcinoma, the tumor is microsatellite stable and no loss of expression of mismatch repair proteins. 2. Indeterminate right liver lesion on a CT 10/13/2012, status post intraoperative ultrasound and biopsy on 12/11/2012 with no malignancy found; stable liver on a CT of the abdomen May 2014.  3. Mildly elevated preoperative CEA. 4. Gout.  5. Cutaneous nodular lesions-likely sebaceous cysts versus sarcoidosis.  6. Calcified chest lymph nodes on a CT 10/27/2012, "granuloma "found on the surgical pathology from the liver biopsy 12/11/2012-? Sarcoidosis. The ACE level was not elevated on 01/13/2013. 7. Tobacco use. He was encouraged to stop smoking. 8. Colonoscopy 09/22/2013. 5 sessile polyps were found in the rectum and in the transverse colon. The polyps 2-3 mm in size. External and internal  hemorrhoids   Disposition:  Gary Frederick remains in clinical remission from colon cancer. We will follow-up on the CEA from today. He will return for an office visit and CEA in one year. I referred him to Dr. Benson Norway for a surveillance colonoscopy.  Gary Frederick continues smoking. He is encouraged to discontinue smoking. I will refer him to the lung cancer screening program.      Betsy Coder, MD  01/05/2016  3:03 PM

## 2016-01-05 NOTE — Telephone Encounter (Signed)
Appointments made and avs printed. Patient stated he would like to schedule his own appointment for Dr Benson Norway.

## 2016-01-06 ENCOUNTER — Telehealth: Payer: Self-pay | Admitting: *Deleted

## 2016-01-06 DIAGNOSIS — C184 Malignant neoplasm of transverse colon: Secondary | ICD-10-CM

## 2016-01-06 LAB — CEA (PARALLEL TESTING): CEA: 4.6 ng/mL (ref 0.0–5.0)

## 2016-01-06 LAB — CEA: CEA1: 7 ng/mL — AB (ref 0.0–4.7)

## 2016-01-06 NOTE — Telephone Encounter (Signed)
-----   Message from Ladell Pier, MD sent at 01/06/2016 11:52 AM EST ----- Please call patient, cea is normal, repeat 6 months

## 2016-01-06 NOTE — Telephone Encounter (Signed)
Called pt with CEA result: Normal, per Dr. Benay Spice. He voiced appreciation for call. Order to schedulers for appt.

## 2016-01-10 ENCOUNTER — Telehealth: Payer: Self-pay | Admitting: Oncology

## 2016-01-10 NOTE — Telephone Encounter (Signed)
Left message for patient re 8/3 lab and also confirmed February 2018 appointments. Schedule mailed.

## 2016-02-13 ENCOUNTER — Other Ambulatory Visit: Payer: Self-pay | Admitting: Acute Care

## 2016-02-13 DIAGNOSIS — F17210 Nicotine dependence, cigarettes, uncomplicated: Secondary | ICD-10-CM

## 2016-02-22 DIAGNOSIS — I10 Essential (primary) hypertension: Secondary | ICD-10-CM | POA: Diagnosis not present

## 2016-02-22 DIAGNOSIS — Z6833 Body mass index (BMI) 33.0-33.9, adult: Secondary | ICD-10-CM | POA: Diagnosis not present

## 2016-02-22 DIAGNOSIS — E119 Type 2 diabetes mellitus without complications: Secondary | ICD-10-CM | POA: Diagnosis not present

## 2016-03-02 ENCOUNTER — Ambulatory Visit (INDEPENDENT_AMBULATORY_CARE_PROVIDER_SITE_OTHER): Payer: Medicare Other | Admitting: Acute Care

## 2016-03-02 ENCOUNTER — Ambulatory Visit (INDEPENDENT_AMBULATORY_CARE_PROVIDER_SITE_OTHER)
Admission: RE | Admit: 2016-03-02 | Discharge: 2016-03-02 | Disposition: A | Discharge status: Home / Self Care | Payer: Medicare Other | Source: Ambulatory Visit | Attending: Acute Care | Admitting: Acute Care

## 2016-03-02 DIAGNOSIS — F17210 Nicotine dependence, cigarettes, uncomplicated: Secondary | ICD-10-CM | POA: Diagnosis not present

## 2016-03-02 DIAGNOSIS — Z87891 Personal history of nicotine dependence: Secondary | ICD-10-CM | POA: Diagnosis not present

## 2016-03-02 NOTE — Progress Notes (Signed)
Shared Decision Making Visit Lung Cancer Screening Program [phone])   Eligibility:  Age 60 y.o.  Pack Years Smoking History Calculation: 30 pack years (# packs/per year x # years smoked)  Recent History of coughing up blood No  Unexplained weight loss? No ( >Than 15 pounds within the last 6 months )  Prior History Lung / other cancer No (Diagnosis within the last 5 years already requiring surveillance chest CT Scans).  Smoking Status Current  Former Smokers: Years since quit: NA  Quit Date: NA  Visit Components:  Discussion included one or more decision making aids.Yes  Discussion included risk/benefits of screening. Yes  Discussion included potential follow up diagnostic testing for abnormal scans. Yes  Discussion included meaning and risk of over diagnosis. Yes  Discussion included meaning and risk of False Positives. Yes  Discussion included meaning of total radiation exposure.Yes  Counseling Included:  Importance of adherence to annual lung cancer LDCT screening.Yes  Impact of comorbidities on ability to participate in the program. Yes  Ability and willingness to under diagnostic treatment. Yes  Smoking Cessation Counseling:  Current Smokers:   Discussed importance of smoking cessation.Yes  Information about tobacco cessation classes and interventions provided to patient.Yes  Patient provided with "ticket" for LDCT Scan. Yes  Symptomatic Patient. No  Counseling NA  Diagnosis Code: Tobacco Use Z72.0  Asymptomatic: Yes  Counseling Yes  Former Smokers:   Discussed the importance of maintaining cigarette abstinence.NA  Diagnosis Code: Personal History of Nicotine Dependence. [id number]  Information about tobacco cessation classes and interventions provided to patient. Yes  Patient provided with "ticket" for LDCT Scan.Yes  Written Order for Lung Cancer Screening with LDCT placed in Epic.Yes (CT Chest Lung Cancer Screening Low Dose W/O CM)  [id number] Z12.2-Screening of respiratory organs Z87.891-Personal history of nicotine dependence  I have spent 20 minutes of face to face time with Mr.Juste discussing the risks and benefits of lung cancer screening. We viewed a power point together that explained in detail the above noted topics. We paused at intervals to allow for questions to be asked and answered to ensure understanding.We discussed that the single most powerful action that he can take to decrease his risk of developing lung cancer is to quit smoking. We discussed whether or not he is ready to commit to setting a quit date. He is currently not ready to set  A quit date.We discussed options for tools to aid in quitting smoking including nicotine replacement therapy, non-nicotine medications, support groups, Quit Smart classes, and behavior modification. We discussed that often times setting smaller, more achievable goals, such as eliminating 1 cigarette a day for a week and then 2 cigarettes a day for a week can be helpful in slowly decreasing the number of cigarettes smoked. This allows for a sense of accomplishment as well as providing a clinical benefit. I gave Mr. Demont  the " Be Stronger Than Your Excuses" card with contact information for community resources, classes, free nicotine replacement therapy, and access to mobile apps, text messaging, and on-line smoking cessation help. I have also given him my card and contact information in the event he needs to contact me. We discussed the time and location of the scan, and that either June Leap, CMA, or I will call with the results within 24-48 hours of receiving . I have provided him with a copy of the power point we viewed  as a resource in the event they need reinforcement of the concepts we discussed today in the  office. The patient verbalized understanding of all of  the above and had no further questions upon leaving the office. They have my contact information in the event  they have any further questions.   Magdalen Spatz, NP  03/02/16

## 2016-03-04 ENCOUNTER — Encounter: Payer: Self-pay | Admitting: Acute Care

## 2016-03-10 ENCOUNTER — Other Ambulatory Visit: Payer: Self-pay | Admitting: Acute Care

## 2016-03-10 DIAGNOSIS — F17210 Nicotine dependence, cigarettes, uncomplicated: Secondary | ICD-10-CM

## 2016-05-22 DIAGNOSIS — E119 Type 2 diabetes mellitus without complications: Secondary | ICD-10-CM | POA: Diagnosis not present

## 2016-05-22 DIAGNOSIS — E785 Hyperlipidemia, unspecified: Secondary | ICD-10-CM | POA: Diagnosis not present

## 2016-05-22 DIAGNOSIS — M545 Low back pain: Secondary | ICD-10-CM | POA: Diagnosis not present

## 2016-07-05 ENCOUNTER — Other Ambulatory Visit: Payer: Medicare Other

## 2016-07-17 ENCOUNTER — Telehealth: Payer: Self-pay | Admitting: Oncology

## 2016-07-17 NOTE — Telephone Encounter (Signed)
Returned patient's call as per patient's request to reschedule lab appt. Left message.

## 2016-07-18 ENCOUNTER — Other Ambulatory Visit (HOSPITAL_BASED_OUTPATIENT_CLINIC_OR_DEPARTMENT_OTHER): Payer: Medicare Other

## 2016-07-18 DIAGNOSIS — C184 Malignant neoplasm of transverse colon: Secondary | ICD-10-CM | POA: Diagnosis not present

## 2016-07-18 LAB — CEA (IN HOUSE-CHCC): CEA (CHCC-IN HOUSE): 6.8 ng/mL — AB (ref 0.00–5.00)

## 2016-07-19 ENCOUNTER — Telehealth: Payer: Self-pay | Admitting: *Deleted

## 2016-07-19 LAB — CEA: CEA1: 7.5 ng/mL — AB (ref 0.0–4.7)

## 2016-07-19 NOTE — Telephone Encounter (Signed)
-----   Message from Ladell Pier, MD sent at 07/19/2016  8:40 AM EDT ----- Please call patient, cea is mildly elevated-chronic, is he still smoking, if yes the cea is likely due to smoking, repeat 3 months

## 2016-07-20 ENCOUNTER — Telehealth: Payer: Self-pay | Admitting: *Deleted

## 2016-07-20 NOTE — Telephone Encounter (Signed)
Per MD, notified pt CEA is mildly elevated. Pt confirmed he is still smoking. Discussed with pt the elevated CEA is likely due to smoking. Pt will f/u in 3 months for repeat labs. Message to scheduling.

## 2016-07-20 NOTE — Telephone Encounter (Signed)
-----   Message from Ladell Pier, MD sent at 07/19/2016  8:40 AM EDT ----- Please call patient, cea is mildly elevated-chronic, is he still smoking, if yes the cea is likely due to smoking, repeat 3 months

## 2016-07-21 ENCOUNTER — Telehealth: Payer: Self-pay | Admitting: Oncology

## 2016-07-21 NOTE — Telephone Encounter (Signed)
Called patient to conf appt. L/M Appt letter and schd mailed. 07/21/16 °

## 2016-08-21 DIAGNOSIS — E669 Obesity, unspecified: Secondary | ICD-10-CM | POA: Diagnosis not present

## 2016-08-21 DIAGNOSIS — E119 Type 2 diabetes mellitus without complications: Secondary | ICD-10-CM | POA: Diagnosis not present

## 2016-08-21 DIAGNOSIS — E6609 Other obesity due to excess calories: Secondary | ICD-10-CM | POA: Diagnosis not present

## 2016-08-21 DIAGNOSIS — E661 Drug-induced obesity: Secondary | ICD-10-CM | POA: Diagnosis not present

## 2016-08-21 DIAGNOSIS — Z23 Encounter for immunization: Secondary | ICD-10-CM | POA: Diagnosis not present

## 2016-08-21 DIAGNOSIS — I10 Essential (primary) hypertension: Secondary | ICD-10-CM | POA: Diagnosis not present

## 2016-10-19 ENCOUNTER — Telehealth: Payer: Self-pay | Admitting: *Deleted

## 2016-10-19 ENCOUNTER — Other Ambulatory Visit (HOSPITAL_BASED_OUTPATIENT_CLINIC_OR_DEPARTMENT_OTHER): Payer: Medicare Other

## 2016-10-19 DIAGNOSIS — C184 Malignant neoplasm of transverse colon: Secondary | ICD-10-CM

## 2016-10-19 LAB — CEA (IN HOUSE-CHCC): CEA (CHCC-In House): 6.15 ng/mL — ABNORMAL HIGH (ref 0.00–5.00)

## 2016-10-19 NOTE — Telephone Encounter (Signed)
Per Dr. Sherrill, patient notified that cea is stable and to f/u as scheduled.  Patient appreciative of call and has no questions at this time.  

## 2016-10-19 NOTE — Telephone Encounter (Signed)
-----   Message from Ladell Pier, MD sent at 10/19/2016  5:15 PM EST ----- Please call patient, cea is stable, f/u as scheduled

## 2016-10-20 LAB — CEA: CEA: 6.6 ng/mL — ABNORMAL HIGH (ref 0.0–4.7)

## 2016-11-05 DIAGNOSIS — Z85038 Personal history of other malignant neoplasm of large intestine: Secondary | ICD-10-CM | POA: Diagnosis not present

## 2016-11-19 DIAGNOSIS — E785 Hyperlipidemia, unspecified: Secondary | ICD-10-CM | POA: Diagnosis not present

## 2016-11-19 DIAGNOSIS — E119 Type 2 diabetes mellitus without complications: Secondary | ICD-10-CM | POA: Diagnosis not present

## 2016-11-20 DIAGNOSIS — Z6833 Body mass index (BMI) 33.0-33.9, adult: Secondary | ICD-10-CM | POA: Diagnosis not present

## 2016-11-20 DIAGNOSIS — E785 Hyperlipidemia, unspecified: Secondary | ICD-10-CM | POA: Diagnosis not present

## 2016-11-20 DIAGNOSIS — E119 Type 2 diabetes mellitus without complications: Secondary | ICD-10-CM | POA: Diagnosis not present

## 2017-01-04 ENCOUNTER — Other Ambulatory Visit (HOSPITAL_BASED_OUTPATIENT_CLINIC_OR_DEPARTMENT_OTHER): Payer: Medicare Other

## 2017-01-04 ENCOUNTER — Other Ambulatory Visit: Payer: Self-pay | Admitting: Nurse Practitioner

## 2017-01-04 ENCOUNTER — Ambulatory Visit (HOSPITAL_BASED_OUTPATIENT_CLINIC_OR_DEPARTMENT_OTHER): Payer: Medicare Other | Admitting: Nurse Practitioner

## 2017-01-04 ENCOUNTER — Telehealth: Payer: Self-pay | Admitting: Nurse Practitioner

## 2017-01-04 VITALS — BP 132/88 | HR 88 | Temp 97.8°F | Resp 18 | Ht 73.0 in | Wt 256.5 lb

## 2017-01-04 DIAGNOSIS — C184 Malignant neoplasm of transverse colon: Secondary | ICD-10-CM

## 2017-01-04 DIAGNOSIS — Z85038 Personal history of other malignant neoplasm of large intestine: Secondary | ICD-10-CM

## 2017-01-04 DIAGNOSIS — Z720 Tobacco use: Secondary | ICD-10-CM | POA: Diagnosis not present

## 2017-01-04 DIAGNOSIS — C189 Malignant neoplasm of colon, unspecified: Secondary | ICD-10-CM

## 2017-01-04 LAB — CEA (IN HOUSE-CHCC): CEA (CHCC-IN HOUSE): 5.38 ng/mL — AB (ref 0.00–5.00)

## 2017-01-04 NOTE — Telephone Encounter (Signed)
Appointments scheduled per 2/2 LOS. Patient given AVS report and calendars with future scheduled appointments. °

## 2017-01-04 NOTE — Progress Notes (Addendum)
  [facility] OFFICE PROGRESS NOTE   Diagnosis:  Colon cancer  INTERVAL HISTORY:   Gary Frederick returns as scheduled. He feels well. Bowels overall moving regularly. He has intermittent constipated. He takes a laxative if needed. No nausea or vomiting. He reports a good appetite. He continues smoking estimating 1-1/2-2 packs every 2 days.  Objective:  Vital signs in last 24 hours:  Blood pressure 132/88, pulse 88, temperature 97.8 F (36.6 C), temperature source Oral, resp. rate 18, height 6' 1" (1.854 m), weight 256 lb 8 oz (116.3 kg), SpO2 99 %.    HEENT: No neck mass. Lymphatics: No palpable cervical, supra clavicular, axillary or inguinal lymph nodes. Resp: Lungs with scattered rhonchi. No respiratory distress. Cardio: Regular rate and rhythm. GI: Abdomen soft and nontender. No organomegaly. Vascular: No leg edema.   Lab Results:  Lab Results  Component Value Date   WBC 8.9 12/14/2012   HGB 12.6 (L) 12/14/2012   HCT 37.1 (L) 12/14/2012   MCV 90.9 12/14/2012   PLT 186 12/14/2012    Imaging:  No results found.  Medications: I have reviewed the patient's current medications.  Assessment/Plan: 1. Adenocarcinoma transverse colon , status post a right colectomy on 12/11/2012, stage II (T3 N0) well differentiated adenocarcinoma, the tumor is microsatellite stable and no loss of expression of mismatch repair proteins. 2. Indeterminate right liver lesion on a CT 10/13/2012, status post intraoperative ultrasound and biopsy on 12/11/2012 with no malignancy found; stable liver on a CT of the abdomen May 2014.  3. Mildly elevated preoperative CEA. 4. Gout.  5. Cutaneous nodular lesions-likely sebaceous cysts versus sarcoidosis.  6. Calcified chest lymph nodes on a CT 10/27/2012, "granuloma "found on the surgical pathology from the liver biopsy 12/11/2012-? Sarcoidosis. The ACE level was not elevated on 01/13/2013. 7. Tobacco use. He was encouraged to stop  smoking. 8. Colonoscopy 09/22/2013. 5 sessile polyps were found in the rectum and in the transverse colon. The polyps 2-3 mm in size. External and internal hemorrhoids   Disposition:Gary Frederick remains in clinical remission from colon cancer. We will follow-up on the CEA from today. He will return for a follow-up visit and CEA in one year. We will contact Dr. Ulyses Amor office to determine the timing of his next surveillance colonoscopy.  We discussed smoking cessation. He is enrolled in the lung cancer screening program.    Ned Card ANP/GNP-BC   01/04/2017  2:17 PM   Addendum 01/07/2017-we contacted Dr. Ulyses Amor office. Next colonoscopy is due September 2019.

## 2017-01-08 ENCOUNTER — Telehealth: Payer: Self-pay

## 2017-01-08 NOTE — Telephone Encounter (Signed)
Called and informed pt of stable cea results, will repeat in 6 months. Pt also informed his next colonoscopy is due sept 2019. Pt verbalized understanding and denies any questions or concerns at this time.

## 2017-01-08 NOTE — Telephone Encounter (Signed)
-----   Message from [name], NP sent at 01/04/2017  2:20 PM EST ----- Please call Dr. Ulyses Amor office and find out when [name] next colonoscopy is due.

## 2017-01-08 NOTE — Telephone Encounter (Signed)
Called Dr. Ulyses Amor office, pt due for colonoscopy Sept 2019. Will call and inform pt. Informed Marlynn Perking

## 2017-01-08 NOTE — Telephone Encounter (Signed)
-----   Message from [name], NP sent at 01/08/2017  8:52 AM EST ----- Please call him, cea is stable, mild elevation likely due to smoking, repeat cea 6 months

## 2017-02-06 ENCOUNTER — Telehealth: Payer: Self-pay | Admitting: Oncology

## 2017-02-06 NOTE — Telephone Encounter (Signed)
Patient called to change home phone number in [place].

## 2017-02-19 DIAGNOSIS — E119 Type 2 diabetes mellitus without complications: Secondary | ICD-10-CM | POA: Diagnosis not present

## 2017-02-19 DIAGNOSIS — E785 Hyperlipidemia, unspecified: Secondary | ICD-10-CM | POA: Diagnosis not present

## 2017-04-24 ENCOUNTER — Inpatient Hospital Stay: Admission: RE | Admit: 2017-04-24 | Payer: Medicare Other | Source: Ambulatory Visit

## 2017-04-30 ENCOUNTER — Encounter (INDEPENDENT_AMBULATORY_CARE_PROVIDER_SITE_OTHER): Payer: Self-pay

## 2017-04-30 ENCOUNTER — Ambulatory Visit (INDEPENDENT_AMBULATORY_CARE_PROVIDER_SITE_OTHER)
Admission: RE | Admit: 2017-04-30 | Discharge: 2017-04-30 | Disposition: A | Discharge status: Home / Self Care | Payer: Medicare Other | Source: Ambulatory Visit | Attending: Acute Care | Admitting: Acute Care

## 2017-04-30 DIAGNOSIS — F17210 Nicotine dependence, cigarettes, uncomplicated: Secondary | ICD-10-CM

## 2017-04-30 DIAGNOSIS — Z87891 Personal history of nicotine dependence: Secondary | ICD-10-CM | POA: Diagnosis not present

## 2017-05-09 ENCOUNTER — Other Ambulatory Visit: Payer: Self-pay | Admitting: Acute Care

## 2017-05-09 DIAGNOSIS — F17210 Nicotine dependence, cigarettes, uncomplicated: Secondary | ICD-10-CM

## 2017-05-28 DIAGNOSIS — M1990 Unspecified osteoarthritis, unspecified site: Secondary | ICD-10-CM | POA: Diagnosis not present

## 2017-05-28 DIAGNOSIS — E118 Type 2 diabetes mellitus with unspecified complications: Secondary | ICD-10-CM | POA: Diagnosis not present

## 2017-07-26 DIAGNOSIS — Z0000 Encounter for general adult medical examination without abnormal findings: Secondary | ICD-10-CM | POA: Diagnosis not present

## 2017-08-19 DIAGNOSIS — Z85038 Personal history of other malignant neoplasm of large intestine: Secondary | ICD-10-CM | POA: Diagnosis not present

## 2017-08-28 DIAGNOSIS — E785 Hyperlipidemia, unspecified: Secondary | ICD-10-CM | POA: Diagnosis not present

## 2017-08-28 DIAGNOSIS — I10 Essential (primary) hypertension: Secondary | ICD-10-CM | POA: Diagnosis not present

## 2017-08-28 DIAGNOSIS — M25511 Pain in right shoulder: Secondary | ICD-10-CM | POA: Diagnosis not present

## 2017-08-28 DIAGNOSIS — M542 Cervicalgia: Secondary | ICD-10-CM | POA: Diagnosis not present

## 2017-08-28 DIAGNOSIS — E118 Type 2 diabetes mellitus with unspecified complications: Secondary | ICD-10-CM | POA: Diagnosis not present

## 2017-12-17 DIAGNOSIS — E118 Type 2 diabetes mellitus with unspecified complications: Secondary | ICD-10-CM | POA: Diagnosis not present

## 2017-12-17 DIAGNOSIS — I10 Essential (primary) hypertension: Secondary | ICD-10-CM | POA: Diagnosis not present

## 2017-12-17 DIAGNOSIS — E785 Hyperlipidemia, unspecified: Secondary | ICD-10-CM | POA: Diagnosis not present

## 2018-01-03 ENCOUNTER — Inpatient Hospital Stay: Payer: Medicare Other | Attending: Oncology | Admitting: Oncology

## 2018-01-03 ENCOUNTER — Encounter: Payer: Self-pay | Admitting: Oncology

## 2018-01-03 ENCOUNTER — Inpatient Hospital Stay: Payer: Medicare Other

## 2018-01-03 ENCOUNTER — Telehealth: Payer: Self-pay | Admitting: Oncology

## 2018-01-03 VITALS — BP 131/93 | HR 68 | Temp 97.9°F | Resp 18 | Ht 73.0 in | Wt 257.9 lb

## 2018-01-03 DIAGNOSIS — C184 Malignant neoplasm of transverse colon: Secondary | ICD-10-CM

## 2018-01-03 DIAGNOSIS — Z85038 Personal history of other malignant neoplasm of large intestine: Secondary | ICD-10-CM | POA: Insufficient documentation

## 2018-01-03 DIAGNOSIS — Z720 Tobacco use: Secondary | ICD-10-CM | POA: Insufficient documentation

## 2018-01-03 LAB — CEA (IN HOUSE-CHCC): CEA (CHCC-In House): 7.43 ng/mL — ABNORMAL HIGH (ref 0.00–5.00)

## 2018-01-03 NOTE — Telephone Encounter (Signed)
Scheduled appt per 2/1 los- lab and f/u in one year - sent reminder letter in the mail.

## 2018-01-03 NOTE — Progress Notes (Signed)
  [place] OFFICE PROGRESS NOTE   Diagnosis: Colon cancer  INTERVAL HISTORY:   Gary Frederick returns as scheduled.  He feels well.  Good appetite and energy level.  No difficulty with bowel function.  He is not sure when he had the last colonoscopy.  He continues smoking.  He is followed by the lung cancer screening program.  Objective:  Vital signs in last 24 hours:  Blood pressure (!) 131/93, pulse 68, temperature 97.9 F (36.6 C), temperature source Oral, resp. rate 18, height '6\' 1"'$  (1.854 m), weight 257 lb 14.4 oz (117 kg), SpO2 99 %.    HEENT: Sebaceous cyst at the right neck Lymphatics: No cervical, supraclavicular, axillary, or inguinal nodes Resp: Lungs with bilateral bronchial sounds, no respiratory distress Cardio: Regular rate and rhythm GI: No hepatosplenomegaly, no mass, nontender Vascular: No leg edema   Lab Results:   Lab Results  Component Value Date   CEA1 5.38 (H) 01/04/2017    No results found for: INR  Imaging:  No results found.  Medications: I have reviewed the patient's current medications.   Assessment/Plan: 1. Adenocarcinoma transverse colon , status post a right colectomy on 12/11/2012, stage II (T3 N0) well differentiated adenocarcinoma, the tumor is microsatellite stable and no loss of expression of mismatch repair proteins. 2. Indeterminate right liver lesion on a CT 10/13/2012, status post intraoperative ultrasound and biopsy on 12/11/2012 with no malignancy found; stable liver on a CT of the abdomen May 2014.  3. Mildly elevated preoperative CEA. 4. Gout.  5. Cutaneous nodular lesions-likely sebaceous cysts versus sarcoidosis.  6. Calcified chest lymph nodes on a CT 10/27/2012, "granuloma "found on the surgical pathology from the liver biopsy 12/11/2012-? Sarcoidosis. The ACE level was not elevated on 01/13/2013. 7. Tobacco use. He was encouraged to stop smoking. 8. Colonoscopy 09/22/2013. 5 sessile polyps were  found in the rectum and in the transverse colon. The polyps 2-3 mm in size. External and internal hemorrhoids   Disposition: Gary Frederick remains in clinical remission from colon cancer.  We will follow-up on the CEA from today.  He will return for an office visit and CEA in 1 year.  Chronic mild elevation of the CEA is likely related to ongoing tobacco use.  We will refer him to Dr. Benson Norway to discuss the timing of a surveillance colonoscopy.  15 minutes were spent with the patient today.  The majority of the time was used for counseling and coordination of care.   Betsy Coder, MD  01/03/2018  12:27 PM

## 2018-01-06 ENCOUNTER — Telehealth: Payer: Self-pay | Admitting: Oncology

## 2018-01-06 ENCOUNTER — Telehealth: Payer: Self-pay | Admitting: Emergency Medicine

## 2018-01-06 NOTE — Telephone Encounter (Addendum)
pts wife verbalized understanding. Scheduling message sent for labs in 4 months.   ----- Message from Ladell Pier, MD sent at 01/05/2018  3:22 PM EST ----- Please call patient, cea still mildly elevated, likely due to ongoing tobacco use, repeat 4 months and f/u as scheduled

## 2018-01-06 NOTE — Telephone Encounter (Signed)
Scheduled appt per 2/4 sch message - Sent reminder letter in the mail with appt date and time - lab in 4 months./

## 2018-01-10 ENCOUNTER — Telehealth: Payer: Self-pay | Admitting: *Deleted

## 2018-01-10 NOTE — Telephone Encounter (Signed)
Received call from pt asking for CEA result.  He states that his wife had told him that it was up.  Reviewed chart & informed of result & Dr Benay Spice thinks that it could be from his smoking & discussed smoking cessation.  He wanted to make sure that he just needed repeat labs in June & not see MD.  Informed that this is correct.  He was satisfied with conversation & knows to call if any symptoms related to his colon cancer.

## 2018-03-18 DIAGNOSIS — Z6832 Body mass index (BMI) 32.0-32.9, adult: Secondary | ICD-10-CM | POA: Diagnosis not present

## 2018-03-18 DIAGNOSIS — E1165 Type 2 diabetes mellitus with hyperglycemia: Secondary | ICD-10-CM | POA: Diagnosis not present

## 2018-03-18 DIAGNOSIS — E785 Hyperlipidemia, unspecified: Secondary | ICD-10-CM | POA: Diagnosis not present

## 2018-04-01 DIAGNOSIS — E1165 Type 2 diabetes mellitus with hyperglycemia: Secondary | ICD-10-CM | POA: Diagnosis not present

## 2018-04-17 DIAGNOSIS — H5203 Hypermetropia, bilateral: Secondary | ICD-10-CM | POA: Diagnosis not present

## 2018-04-17 DIAGNOSIS — H52222 Regular astigmatism, left eye: Secondary | ICD-10-CM | POA: Diagnosis not present

## 2018-04-17 DIAGNOSIS — H0100B Unspecified blepharitis left eye, upper and lower eyelids: Secondary | ICD-10-CM | POA: Diagnosis not present

## 2018-04-17 DIAGNOSIS — H11423 Conjunctival edema, bilateral: Secondary | ICD-10-CM | POA: Diagnosis not present

## 2018-04-17 DIAGNOSIS — H04123 Dry eye syndrome of bilateral lacrimal glands: Secondary | ICD-10-CM | POA: Diagnosis not present

## 2018-04-17 DIAGNOSIS — H524 Presbyopia: Secondary | ICD-10-CM | POA: Diagnosis not present

## 2018-04-17 DIAGNOSIS — H25013 Cortical age-related cataract, bilateral: Secondary | ICD-10-CM | POA: Diagnosis not present

## 2018-04-17 DIAGNOSIS — E119 Type 2 diabetes mellitus without complications: Secondary | ICD-10-CM | POA: Diagnosis not present

## 2018-04-17 DIAGNOSIS — H0100A Unspecified blepharitis right eye, upper and lower eyelids: Secondary | ICD-10-CM | POA: Diagnosis not present

## 2018-04-17 DIAGNOSIS — H11153 Pinguecula, bilateral: Secondary | ICD-10-CM | POA: Diagnosis not present

## 2018-04-17 DIAGNOSIS — H18413 Arcus senilis, bilateral: Secondary | ICD-10-CM | POA: Diagnosis not present

## 2018-04-17 DIAGNOSIS — H2513 Age-related nuclear cataract, bilateral: Secondary | ICD-10-CM | POA: Diagnosis not present

## 2018-05-01 ENCOUNTER — Inpatient Hospital Stay: Admission: RE | Admit: 2018-05-01 | Payer: Medicare Other | Source: Ambulatory Visit

## 2018-05-02 ENCOUNTER — Other Ambulatory Visit: Payer: Self-pay | Admitting: *Deleted

## 2018-05-02 DIAGNOSIS — C184 Malignant neoplasm of transverse colon: Secondary | ICD-10-CM

## 2018-05-05 ENCOUNTER — Ambulatory Visit: Payer: Medicare Other | Admitting: Oncology

## 2018-05-05 ENCOUNTER — Inpatient Hospital Stay: Payer: Medicare Other | Attending: Oncology

## 2018-05-05 DIAGNOSIS — E118 Type 2 diabetes mellitus with unspecified complications: Secondary | ICD-10-CM | POA: Diagnosis not present

## 2018-05-05 DIAGNOSIS — C184 Malignant neoplasm of transverse colon: Secondary | ICD-10-CM | POA: Diagnosis not present

## 2018-05-05 LAB — CEA (IN HOUSE-CHCC): CEA (CHCC-In House): 7.43 ng/mL — ABNORMAL HIGH (ref 0.00–5.00)

## 2018-05-06 ENCOUNTER — Telehealth: Payer: Self-pay | Admitting: Emergency Medicine

## 2018-05-06 NOTE — Telephone Encounter (Addendum)
Pt wife states he does still smoke. She verbalized understanding of this note.   ----- Message from Ladell Pier, MD sent at 05/05/2018  5:31 PM EDT ----- Please call patient, the CEA remains mildly elevated and stable, likely secondary to ongoing tobacco use, is a still smoking, follow-up as scheduled

## 2018-05-14 ENCOUNTER — Ambulatory Visit (INDEPENDENT_AMBULATORY_CARE_PROVIDER_SITE_OTHER)
Admission: RE | Admit: 2018-05-14 | Discharge: 2018-05-14 | Disposition: A | Discharge status: Home / Self Care | Payer: Medicare Other | Source: Ambulatory Visit | Attending: Acute Care | Admitting: Acute Care

## 2018-05-14 DIAGNOSIS — F17210 Nicotine dependence, cigarettes, uncomplicated: Secondary | ICD-10-CM | POA: Diagnosis not present

## 2018-05-20 ENCOUNTER — Other Ambulatory Visit: Payer: Self-pay | Admitting: Acute Care

## 2018-05-20 DIAGNOSIS — Z122 Encounter for screening for malignant neoplasm of respiratory organs: Secondary | ICD-10-CM

## 2018-05-20 DIAGNOSIS — E118 Type 2 diabetes mellitus with unspecified complications: Secondary | ICD-10-CM | POA: Diagnosis not present

## 2018-05-20 DIAGNOSIS — I10 Essential (primary) hypertension: Secondary | ICD-10-CM | POA: Diagnosis not present

## 2018-05-20 DIAGNOSIS — F17210 Nicotine dependence, cigarettes, uncomplicated: Secondary | ICD-10-CM

## 2018-06-10 DIAGNOSIS — E118 Type 2 diabetes mellitus with unspecified complications: Secondary | ICD-10-CM | POA: Diagnosis not present

## 2018-06-10 DIAGNOSIS — Z6832 Body mass index (BMI) 32.0-32.9, adult: Secondary | ICD-10-CM | POA: Diagnosis not present

## 2018-09-08 DIAGNOSIS — E118 Type 2 diabetes mellitus with unspecified complications: Secondary | ICD-10-CM | POA: Diagnosis not present

## 2018-09-08 DIAGNOSIS — Z23 Encounter for immunization: Secondary | ICD-10-CM | POA: Diagnosis not present

## 2018-09-30 DIAGNOSIS — Z0000 Encounter for general adult medical examination without abnormal findings: Secondary | ICD-10-CM | POA: Diagnosis not present

## 2018-10-14 DIAGNOSIS — R1013 Epigastric pain: Secondary | ICD-10-CM | POA: Diagnosis not present

## 2018-10-15 ENCOUNTER — Ambulatory Visit
Admission: RE | Admit: 2018-10-15 | Discharge: 2018-10-15 | Disposition: A | Discharge status: Home / Self Care | Payer: Medicare Other | Source: Ambulatory Visit | Attending: Family Medicine | Admitting: Family Medicine

## 2018-10-15 ENCOUNTER — Other Ambulatory Visit: Payer: Self-pay | Admitting: Family Medicine

## 2018-10-15 DIAGNOSIS — R109 Unspecified abdominal pain: Secondary | ICD-10-CM | POA: Diagnosis not present

## 2018-10-16 DIAGNOSIS — R1013 Epigastric pain: Secondary | ICD-10-CM | POA: Diagnosis not present

## 2018-10-16 DIAGNOSIS — K219 Gastro-esophageal reflux disease without esophagitis: Secondary | ICD-10-CM | POA: Diagnosis not present

## 2018-10-16 DIAGNOSIS — I10 Essential (primary) hypertension: Secondary | ICD-10-CM | POA: Diagnosis not present

## 2018-10-16 DIAGNOSIS — Z6832 Body mass index (BMI) 32.0-32.9, adult: Secondary | ICD-10-CM | POA: Diagnosis not present

## 2018-12-09 DIAGNOSIS — E118 Type 2 diabetes mellitus with unspecified complications: Secondary | ICD-10-CM | POA: Diagnosis not present

## 2018-12-09 DIAGNOSIS — I10 Essential (primary) hypertension: Secondary | ICD-10-CM | POA: Diagnosis not present

## 2018-12-09 DIAGNOSIS — Z6833 Body mass index (BMI) 33.0-33.9, adult: Secondary | ICD-10-CM | POA: Diagnosis not present

## 2019-01-05 ENCOUNTER — Other Ambulatory Visit: Payer: Medicare Other

## 2019-01-05 ENCOUNTER — Ambulatory Visit: Payer: Medicare Other | Admitting: Oncology

## 2019-01-06 ENCOUNTER — Telehealth: Payer: Self-pay | Admitting: Oncology

## 2019-01-06 NOTE — Telephone Encounter (Signed)
R/s appt per 2/3 sch message -- pt is aware of appt date and time   

## 2019-02-02 ENCOUNTER — Other Ambulatory Visit: Payer: Self-pay

## 2019-02-02 ENCOUNTER — Inpatient Hospital Stay (HOSPITAL_BASED_OUTPATIENT_CLINIC_OR_DEPARTMENT_OTHER): Payer: Medicare Other | Admitting: Oncology

## 2019-02-02 ENCOUNTER — Inpatient Hospital Stay: Payer: Medicare Other | Attending: Oncology

## 2019-02-02 VITALS — BP 140/91 | HR 65 | Temp 97.5°F | Resp 19 | Wt 255.8 lb

## 2019-02-02 DIAGNOSIS — F17210 Nicotine dependence, cigarettes, uncomplicated: Secondary | ICD-10-CM

## 2019-02-02 DIAGNOSIS — Z85038 Personal history of other malignant neoplasm of large intestine: Secondary | ICD-10-CM | POA: Diagnosis not present

## 2019-02-02 DIAGNOSIS — R970 Elevated carcinoembryonic antigen [CEA]: Secondary | ICD-10-CM | POA: Insufficient documentation

## 2019-02-02 DIAGNOSIS — C184 Malignant neoplasm of transverse colon: Secondary | ICD-10-CM

## 2019-02-02 LAB — CEA (IN HOUSE-CHCC): CEA (CHCC-IN HOUSE): 5.17 ng/mL — AB (ref 0.00–5.00)

## 2019-02-02 NOTE — Progress Notes (Signed)
  [place] OFFICE PROGRESS NOTE   Diagnosis: Colon cancer  INTERVAL HISTORY:   Gary Frederick returns as scheduled.  He feels well.  Good appetite and energy level.  No difficulty with bowel function.  No bleeding.  He continues smoking.  He is followed at the lung cancer screening clinic.  Objective:  Vital signs in last 24 hours:  Blood pressure (!) 140/91, pulse 65, temperature (!) 97.5 F (36.4 C), temperature source Oral, resp. rate 19, weight 255 lb 12.8 oz (116 kg), SpO2 98 %.    HEENT: Neck without mass Lymphatics: No cervical, supraclavicular, axillary, or inguinal nodes Resp: Lungs clear bilaterally, no respiratory distress Cardio: Regular rate and rhythm GI: Nontender, no hepatosplenomegaly, no mass Vascular: No leg edema  Skin: 1.5 cm mobile cutaneous lesion at the right trapezius area    Lab Results  Component Value Date   CEA1 5.17 (H) 02/02/2019     Medications: I have reviewed the patient's current medications.   Assessment/Plan: 1. Adenocarcinoma transverse colon , status post a right colectomy on 12/11/2012, stage II (T3 N0) well differentiated adenocarcinoma, the tumor is microsatellite stable and no loss of expression of mismatch repair proteins. 2. Indeterminate right liver lesion on a CT 10/13/2012, status post intraoperative ultrasound and biopsy on 12/11/2012 with no malignancy found; stable liver on a CT of the abdomen May 2014.  3. Mildly elevated preoperative CEA. 4. Gout.  5. Cutaneous nodular lesions-likely sebaceous cysts versus sarcoidosis.  6. Calcified chest lymph nodes on a CT 10/27/2012, "granuloma "found on the surgical pathology from the liver biopsy 12/11/2012-? Sarcoidosis. The ACE level was not elevated on 01/13/2013. 7. Tobacco use. He was encouraged to stop smoking.  Followed in the lung cancer screening program. 8. Colonoscopy 09/22/2013. 5 sessile polyps were found in the rectum and in the transverse colon.  The polyps 2-3 mm in size. External and internal hemorrhoids   Disposition: Gary Frederick is in remission from colon cancer.  The CEA has been chronically elevated, likely secondary to ongoing tobacco use.  Encouraged him to discontinue smoking.  He continues follow-up in the lung cancer screening program.  I referred him to Dr. Benson Norway for a surveillance colonoscopy.  Gary Frederick would like to continue follow-up at the cancer center.  He will return for an office visit and CEA in 1 year.  15 minutes were spent with the patient today.  The majority of the time was used for counseling and coordination of care.  Betsy Coder, MD  02/02/2019  1:30 PM

## 2019-02-03 ENCOUNTER — Telehealth: Payer: Self-pay | Admitting: Oncology

## 2019-02-03 NOTE — Telephone Encounter (Signed)
Scheduled appt per 3/2 los - sent reminder letter in the mail with appt date and time

## 2019-05-19 DIAGNOSIS — E785 Hyperlipidemia, unspecified: Secondary | ICD-10-CM | POA: Diagnosis not present

## 2019-05-19 DIAGNOSIS — Z0000 Encounter for general adult medical examination without abnormal findings: Secondary | ICD-10-CM | POA: Diagnosis not present

## 2019-05-19 DIAGNOSIS — I10 Essential (primary) hypertension: Secondary | ICD-10-CM | POA: Diagnosis not present

## 2019-05-19 DIAGNOSIS — E1169 Type 2 diabetes mellitus with other specified complication: Secondary | ICD-10-CM | POA: Diagnosis not present

## 2019-06-02 DIAGNOSIS — E1165 Type 2 diabetes mellitus with hyperglycemia: Secondary | ICD-10-CM | POA: Diagnosis not present

## 2019-06-12 ENCOUNTER — Other Ambulatory Visit: Payer: Self-pay | Admitting: *Deleted

## 2019-06-12 DIAGNOSIS — Z122 Encounter for screening for malignant neoplasm of respiratory organs: Secondary | ICD-10-CM

## 2019-06-12 DIAGNOSIS — F17210 Nicotine dependence, cigarettes, uncomplicated: Secondary | ICD-10-CM

## 2019-06-12 DIAGNOSIS — Z87891 Personal history of nicotine dependence: Secondary | ICD-10-CM

## 2019-06-15 DIAGNOSIS — Z86010 Personal history of colonic polyps: Secondary | ICD-10-CM | POA: Diagnosis not present

## 2019-06-15 DIAGNOSIS — R197 Diarrhea, unspecified: Secondary | ICD-10-CM | POA: Diagnosis not present

## 2019-06-15 DIAGNOSIS — I10 Essential (primary) hypertension: Secondary | ICD-10-CM | POA: Diagnosis not present

## 2019-06-15 DIAGNOSIS — Z85038 Personal history of other malignant neoplasm of large intestine: Secondary | ICD-10-CM | POA: Diagnosis not present

## 2019-07-04 DIAGNOSIS — Z860101 Personal history of adenomatous and serrated colon polyps: Secondary | ICD-10-CM

## 2019-07-04 HISTORY — DX: Personal history of adenomatous and serrated colon polyps: Z86.0101

## 2019-07-07 ENCOUNTER — Encounter: Payer: Self-pay | Admitting: Oncology

## 2019-07-07 DIAGNOSIS — K621 Rectal polyp: Secondary | ICD-10-CM | POA: Diagnosis not present

## 2019-07-07 DIAGNOSIS — D124 Benign neoplasm of descending colon: Secondary | ICD-10-CM | POA: Diagnosis not present

## 2019-07-07 DIAGNOSIS — Z85038 Personal history of other malignant neoplasm of large intestine: Secondary | ICD-10-CM | POA: Diagnosis not present

## 2019-07-07 DIAGNOSIS — K635 Polyp of colon: Secondary | ICD-10-CM | POA: Diagnosis not present

## 2019-07-07 DIAGNOSIS — D127 Benign neoplasm of rectosigmoid junction: Secondary | ICD-10-CM | POA: Diagnosis not present

## 2019-07-07 DIAGNOSIS — D123 Benign neoplasm of transverse colon: Secondary | ICD-10-CM | POA: Diagnosis not present

## 2019-07-27 ENCOUNTER — Inpatient Hospital Stay: Admission: RE | Admit: 2019-07-27 | Payer: Medicare Other | Source: Ambulatory Visit

## 2019-08-11 ENCOUNTER — Telehealth: Payer: Self-pay | Admitting: Acute Care

## 2019-08-11 NOTE — Telephone Encounter (Signed)
Script Screening patients for COVID-19 and reviewing new operational procedures  Greeting - The reason I am calling is to share with you some new changes to our processes that are designed to help [place] keep everyone safe. Is now a good time to speak with you? Patient says "no' - ask them when you can call back and let them know it's important to do this prior to their appointment.  Patient says "yes" - Doristine Devoid, Meda Coffee) the first thing I need to do is ask you some screening Questions.  1. To the best of your knowledge, have you been in close contact with any one with a confirmed diagnosis of COVID 19? o No - proceed to next question  2. Have you had any one or more of the following: fever, chills, cough, shortness of breath or any flu-like symptoms? o No - proceed to next question  3. Have you been diagnosed with or have a previous diagnosis of COVID 19? o No - proceed to next question  4. I am going to go over a few other symptoms with you. Please let me know if you are experiencing any of the following: . Ear, nose or throat discomfort . A sore throat . Headache . Muscle pain . Diarrhea . Loss of taste or smell o No - proceed to next question  Thank you for answering these questions. Please know we will ask you these questions or similar questions when you arrive for your appointment and again it's how we are keeping everyone safe. Also, to keep you safe, please use the provided hand sanitizer when you enter the building. (Insert pt name), we are asking everyone in the building to wear a mask because they help [place] prevent the spread of germs. Do you have a mask of your own, if not, we are happy to provide one for you. The last thing I want to go over with you is the no visitor guidelines. This means no one can attend the appointment with you unless you need physical assistance. I understand this may be different from your past appointments and I know this may be difficult but  please know if someone is driving you we are happy to call them for you once your appointment is over.  [INSERT [place]  (Insert pt name) I've given you a lot of information, what questions do you have about what I've talked about today or your appointment tomorrow? Benjie Karvonen, CMA

## 2019-08-12 ENCOUNTER — Other Ambulatory Visit: Payer: Self-pay

## 2019-08-12 ENCOUNTER — Ambulatory Visit (INDEPENDENT_AMBULATORY_CARE_PROVIDER_SITE_OTHER)
Admission: RE | Admit: 2019-08-12 | Discharge: 2019-08-12 | Disposition: A | Discharge status: Home / Self Care | Payer: Medicare Other | Source: Ambulatory Visit | Attending: Acute Care | Admitting: Acute Care

## 2019-08-12 DIAGNOSIS — F17210 Nicotine dependence, cigarettes, uncomplicated: Secondary | ICD-10-CM

## 2019-08-12 DIAGNOSIS — Z87891 Personal history of nicotine dependence: Secondary | ICD-10-CM

## 2019-08-12 DIAGNOSIS — Z122 Encounter for screening for malignant neoplasm of respiratory organs: Secondary | ICD-10-CM

## 2019-08-21 ENCOUNTER — Other Ambulatory Visit: Payer: Self-pay | Admitting: *Deleted

## 2019-08-21 DIAGNOSIS — Z87891 Personal history of nicotine dependence: Secondary | ICD-10-CM

## 2019-08-21 DIAGNOSIS — F17210 Nicotine dependence, cigarettes, uncomplicated: Secondary | ICD-10-CM

## 2019-08-21 DIAGNOSIS — Z122 Encounter for screening for malignant neoplasm of respiratory organs: Secondary | ICD-10-CM

## 2019-08-24 DIAGNOSIS — E1169 Type 2 diabetes mellitus with other specified complication: Secondary | ICD-10-CM | POA: Diagnosis not present

## 2019-08-24 DIAGNOSIS — R21 Rash and other nonspecific skin eruption: Secondary | ICD-10-CM | POA: Diagnosis not present

## 2019-09-08 ENCOUNTER — Other Ambulatory Visit: Payer: Self-pay

## 2019-09-08 NOTE — Patient Outreach (Signed)
[facility]) Care Management  09/08/2019  QUINLIN TOPPI 08-Oct-1956 [id number]   Medication Adherence call to Mr. Arnaldo Melancon HIPPA Compliant Voice message left with a call back number. Mr. Rench is showing past due on Repaglinide 0.5 mg under [place].   [place] Management Direct Dial [phone]  Fax [phone] [email or web address]

## 2019-09-28 DIAGNOSIS — E1165 Type 2 diabetes mellitus with hyperglycemia: Secondary | ICD-10-CM | POA: Diagnosis not present

## 2019-09-28 DIAGNOSIS — Z720 Tobacco use: Secondary | ICD-10-CM | POA: Diagnosis not present

## 2019-09-28 DIAGNOSIS — R21 Rash and other nonspecific skin eruption: Secondary | ICD-10-CM | POA: Diagnosis not present

## 2019-12-28 DIAGNOSIS — E1169 Type 2 diabetes mellitus with other specified complication: Secondary | ICD-10-CM | POA: Diagnosis not present

## 2019-12-28 DIAGNOSIS — E782 Mixed hyperlipidemia: Secondary | ICD-10-CM | POA: Diagnosis not present

## 2019-12-28 DIAGNOSIS — R21 Rash and other nonspecific skin eruption: Secondary | ICD-10-CM | POA: Diagnosis not present

## 2020-01-18 DIAGNOSIS — E782 Mixed hyperlipidemia: Secondary | ICD-10-CM | POA: Diagnosis not present

## 2020-01-19 DIAGNOSIS — Z0100 Encounter for examination of eyes and vision without abnormal findings: Secondary | ICD-10-CM | POA: Diagnosis not present

## 2020-01-19 DIAGNOSIS — E119 Type 2 diabetes mellitus without complications: Secondary | ICD-10-CM | POA: Diagnosis not present

## 2020-02-02 ENCOUNTER — Telehealth: Payer: Self-pay | Admitting: *Deleted

## 2020-02-02 ENCOUNTER — Other Ambulatory Visit: Payer: Self-pay

## 2020-02-02 ENCOUNTER — Telehealth: Payer: Self-pay | Admitting: Oncology

## 2020-02-02 ENCOUNTER — Inpatient Hospital Stay: Payer: Medicare Other

## 2020-02-02 ENCOUNTER — Inpatient Hospital Stay: Payer: Medicare Other | Attending: Oncology | Admitting: Oncology

## 2020-02-02 VITALS — BP 124/74 | HR 92 | Temp 98.1°F | Resp 19 | Ht 73.0 in | Wt 256.7 lb

## 2020-02-02 DIAGNOSIS — R970 Elevated carcinoembryonic antigen [CEA]: Secondary | ICD-10-CM | POA: Insufficient documentation

## 2020-02-02 DIAGNOSIS — K5900 Constipation, unspecified: Secondary | ICD-10-CM | POA: Diagnosis not present

## 2020-02-02 DIAGNOSIS — C184 Malignant neoplasm of transverse colon: Secondary | ICD-10-CM

## 2020-02-02 DIAGNOSIS — K648 Other hemorrhoids: Secondary | ICD-10-CM | POA: Insufficient documentation

## 2020-02-02 DIAGNOSIS — K644 Residual hemorrhoidal skin tags: Secondary | ICD-10-CM | POA: Diagnosis not present

## 2020-02-02 DIAGNOSIS — D869 Sarcoidosis, unspecified: Secondary | ICD-10-CM | POA: Diagnosis not present

## 2020-02-02 DIAGNOSIS — M109 Gout, unspecified: Secondary | ICD-10-CM | POA: Insufficient documentation

## 2020-02-02 DIAGNOSIS — F17210 Nicotine dependence, cigarettes, uncomplicated: Secondary | ICD-10-CM | POA: Insufficient documentation

## 2020-02-02 DIAGNOSIS — Z79899 Other long term (current) drug therapy: Secondary | ICD-10-CM | POA: Diagnosis not present

## 2020-02-02 LAB — CEA (IN HOUSE-CHCC): CEA (CHCC-In House): 13.05 ng/mL — ABNORMAL HIGH (ref 0.00–5.00)

## 2020-02-02 NOTE — Telephone Encounter (Signed)
-----   Message from Ladell Pier, MD sent at 02/02/2020  1:52 PM EST ----- Please call patient, CEA is slightly higher, likely related to ongoing tobacco use, repeat in 2 months

## 2020-02-02 NOTE — Progress Notes (Signed)
  [place] OFFICE PROGRESS NOTE   Diagnosis: Colon cancer  INTERVAL HISTORY:   Gary Frederick returns as scheduled.  He feels well.  Good appetite.  He has intermittent constipation.  He plans to take MiraLAX as needed.  He underwent a colonoscopy last year (we do not have this report).  He continues smoking approximately 1/2 pack cigarettes per day.  Objective:  Vital signs in last 24 hours:  Blood pressure 124/74, pulse 92, temperature 98.1 F (36.7 C), temperature source Oral, resp. rate 19, height '6\' 1"'$  (1.854 m), weight 256 lb 11.2 oz (116.4 kg), SpO2 98 %.   Limited physical examination secondary to distancing with the Covid pandemic Lymphatics: No cervical, supraclavicular, axillary, or inguinal nodes GI: No hepatosplenomegaly, no mass, nontender Vascular: No leg edema     Lab Results:  Lab Results  Component Value Date   WBC 8.9 12/14/2012   HGB 12.6 (L) 12/14/2012   HCT 37.1 (L) 12/14/2012   MCV 90.9 12/14/2012   PLT 186 12/14/2012    CMP  Lab Results  Component Value Date   NA 137 12/14/2012   K 3.4 (L) 12/14/2012   CL 99 12/14/2012   CO2 29 12/14/2012   GLUCOSE 110 (H) 12/14/2012   BUN 7 12/14/2012   CREATININE 0.91 03/25/2013   CALCIUM 8.9 12/14/2012   GFRNONAA >90 12/14/2012   GFRAA >90 12/14/2012    Lab Results  Component Value Date   CEA1 5.17 (H) 02/02/2019   Medications: I have reviewed the patient's current medications.   Assessment/Plan: 1. Adenocarcinoma transverse colon , status post a right colectomy on 12/11/2012, stage II (T3 N0) well differentiated adenocarcinoma, the tumor is microsatellite stable and no loss of expression of mismatch repair proteins. 2. Indeterminate right liver lesion on a CT 10/13/2012, status post intraoperative ultrasound and biopsy on 12/11/2012 with no malignancy found; stable liver on a CT of the abdomen May 2014.  3. Mildly elevated preoperative CEA. 4. Gout.  5. History of cutaneous  nodular lesions-likely sebaceous cysts versus sarcoidosis.  6. Calcified chest lymph nodes on a CT 10/27/2012, "granuloma "found on the surgical pathology from the liver biopsy 12/11/2012-? Sarcoidosis. The ACE level was not elevated on 01/13/2013. 7. Tobacco use. He was encouraged to stop smoking.  Followed in the lung cancer screening program. 8. Colonoscopy 09/22/2013. 5 sessile polyps were found in the rectum and in the transverse colon. The polyps 2-3 mm in size. External and internal hemorrhoids    Disposition: Gary Frederick appears stable.  He is in clinical remission from colon cancer.  We will follow up on the CEA from today.  We will get the 2020 colonoscopy report from Dr. Benson Norway.  I encouraged Gary Frederick to discontinue smoking.  I also encouraged him to obtain the COVID-19 vaccine when available.  He would like to continue follow-up at the cancer center.  He will return for an office visit and CEA in 1 year.  Betsy Coder, MD  02/02/2020  1:01 PM

## 2020-02-02 NOTE — Telephone Encounter (Signed)
Scheduled appt per 3/2 sch msg. Pt is aware of appt date and time.

## 2020-02-02 NOTE — Telephone Encounter (Signed)
Patient called back w/concern about increase in CEA. Reminded him it can be higher in smokers. MD will recheck in 2 months and if still going up he will order a CT scan. We have also requested his last colonoscopy report w/pathology from Dr. Benson Norway and he will review. Patient asking if donating plasma can cause CEA to be higher (he gave plasma today)? Informed him that no, this would not cause his CEA to be higher.

## 2020-02-02 NOTE — Telephone Encounter (Signed)
Per Dr. Benay Spice, called to make pt aware that his CEA is slightly higher, likely related to ongoing tobacco use. Will repeat again in 2 months. Per verbalized understanding. Advised to call if there are any other concerns

## 2020-02-03 ENCOUNTER — Telehealth: Payer: Self-pay | Admitting: Oncology

## 2020-02-03 NOTE — Telephone Encounter (Signed)
Scheduled per los. Called and left msg. Mailed printout  °

## 2020-02-19 ENCOUNTER — Emergency Department (HOSPITAL_COMMUNITY)
Admission: EM | Admit: 2020-02-19 | Discharge: 2020-02-19 | Disposition: A | Discharge status: Home / Self Care | Payer: Medicare Other | Attending: Emergency Medicine | Admitting: Emergency Medicine

## 2020-02-19 ENCOUNTER — Encounter (HOSPITAL_COMMUNITY): Payer: Self-pay | Admitting: Emergency Medicine

## 2020-02-19 ENCOUNTER — Emergency Department (HOSPITAL_COMMUNITY): Payer: Medicare Other

## 2020-02-19 ENCOUNTER — Other Ambulatory Visit: Payer: Self-pay

## 2020-02-19 DIAGNOSIS — M25512 Pain in left shoulder: Secondary | ICD-10-CM | POA: Diagnosis present

## 2020-02-19 DIAGNOSIS — J189 Pneumonia, unspecified organism: Secondary | ICD-10-CM | POA: Diagnosis not present

## 2020-02-19 DIAGNOSIS — E119 Type 2 diabetes mellitus without complications: Secondary | ICD-10-CM | POA: Diagnosis not present

## 2020-02-19 DIAGNOSIS — R9389 Abnormal findings on diagnostic imaging of other specified body structures: Secondary | ICD-10-CM | POA: Insufficient documentation

## 2020-02-19 DIAGNOSIS — F17210 Nicotine dependence, cigarettes, uncomplicated: Secondary | ICD-10-CM | POA: Diagnosis not present

## 2020-02-19 DIAGNOSIS — R918 Other nonspecific abnormal finding of lung field: Secondary | ICD-10-CM | POA: Diagnosis not present

## 2020-02-19 DIAGNOSIS — R0781 Pleurodynia: Secondary | ICD-10-CM | POA: Diagnosis not present

## 2020-02-19 DIAGNOSIS — Z85038 Personal history of other malignant neoplasm of large intestine: Secondary | ICD-10-CM | POA: Diagnosis not present

## 2020-02-19 DIAGNOSIS — I10 Essential (primary) hypertension: Secondary | ICD-10-CM | POA: Insufficient documentation

## 2020-02-19 DIAGNOSIS — Z79899 Other long term (current) drug therapy: Secondary | ICD-10-CM | POA: Insufficient documentation

## 2020-02-19 DIAGNOSIS — R05 Cough: Secondary | ICD-10-CM | POA: Diagnosis not present

## 2020-02-19 DIAGNOSIS — R079 Chest pain, unspecified: Secondary | ICD-10-CM | POA: Insufficient documentation

## 2020-02-19 DIAGNOSIS — Z20822 Contact with and (suspected) exposure to covid-19: Secondary | ICD-10-CM | POA: Insufficient documentation

## 2020-02-19 DIAGNOSIS — R0602 Shortness of breath: Secondary | ICD-10-CM | POA: Diagnosis not present

## 2020-02-19 LAB — CBC WITH DIFFERENTIAL/PLATELET
Abs Immature Granulocytes: 0.03 10*3/uL (ref 0.00–0.07)
Basophils Absolute: 0 10*3/uL (ref 0.0–0.1)
Basophils Relative: 1 %
Eosinophils Absolute: 0.1 10*3/uL (ref 0.0–0.5)
Eosinophils Relative: 1 %
HCT: 48.2 % (ref 39.0–52.0)
Hemoglobin: 16.5 g/dL (ref 13.0–17.0)
Immature Granulocytes: 0 %
Lymphocytes Relative: 23 %
Lymphs Abs: 1.8 10*3/uL (ref 0.7–4.0)
MCH: 31.4 pg (ref 26.0–34.0)
MCHC: 34.2 g/dL (ref 30.0–36.0)
MCV: 91.6 fL (ref 80.0–100.0)
Monocytes Absolute: 0.9 10*3/uL (ref 0.1–1.0)
Monocytes Relative: 11 %
Neutro Abs: 5 10*3/uL (ref 1.7–7.7)
Neutrophils Relative %: 64 %
Platelets: 214 10*3/uL (ref 150–400)
RBC: 5.26 MIL/uL (ref 4.22–5.81)
RDW: 12.2 % (ref 11.5–15.5)
WBC: 7.8 10*3/uL (ref 4.0–10.5)
nRBC: 0 % (ref 0.0–0.2)

## 2020-02-19 LAB — BASIC METABOLIC PANEL
Anion gap: 11 (ref 5–15)
BUN: 5 mg/dL — ABNORMAL LOW (ref 8–23)
CO2: 25 mmol/L (ref 22–32)
Calcium: 9 mg/dL (ref 8.9–10.3)
Chloride: 101 mmol/L (ref 98–111)
Creatinine, Ser: 0.82 mg/dL (ref 0.61–1.24)
GFR calc Af Amer: >60 mL/min (ref >60)
GFR calc non Af Amer: >60 mL/min (ref >60)
Glucose, Bld: 161 mg/dL — ABNORMAL HIGH (ref 70–99)
Potassium: 3.9 mmol/L (ref 3.5–5.1)
Sodium: 137 mmol/L (ref 135–145)

## 2020-02-19 LAB — SARS CORONAVIRUS 2 (TAT 6-24 HRS): SARS Coronavirus 2: NEGATIVE

## 2020-02-19 MED ORDER — HYDROMORPHONE HCL 1 MG/ML IJ SOLN
1.0000 mg | Freq: Once | INTRAMUSCULAR | Status: AC
  Administered 2020-02-19: 1 mg via INTRAVENOUS
  Filled 2020-02-19: qty 1

## 2020-02-19 MED ORDER — LIDOCAINE 5 % EX PTCH
1.0000 | MEDICATED_PATCH | Freq: Once | CUTANEOUS | Status: DC
  Administered 2020-02-19: 1 via TRANSDERMAL
  Filled 2020-02-19: qty 1

## 2020-02-19 MED ORDER — AMOXICILLIN-POT CLAVULANATE 875-125 MG PO TABS: 1.0000 | ORAL_TABLET | Freq: Two times a day (BID) | ORAL | 0 refills | Status: AC

## 2020-02-19 MED ORDER — AZITHROMYCIN 250 MG PO TABS
500.0000 mg | ORAL_TABLET | Freq: Once | ORAL | Status: AC
  Administered 2020-02-19: 500 mg via ORAL
  Filled 2020-02-19: qty 2

## 2020-02-19 MED ORDER — IOHEXOL 350 MG/ML SOLN
100.0000 mL | Freq: Once | INTRAVENOUS | Status: AC
  Administered 2020-02-19: 85 mL via INTRAVENOUS

## 2020-02-19 MED ORDER — AMOXICILLIN-POT CLAVULANATE 875-125 MG PO TABS
1.0000 | ORAL_TABLET | Freq: Once | ORAL | Status: AC
  Administered 2020-02-19: 1 via ORAL
  Filled 2020-02-19: qty 1

## 2020-02-19 MED ORDER — AZITHROMYCIN 250 MG PO TABS: 250.0000 mg | ORAL_TABLET | Freq: Every day | ORAL | 0 refills | Status: AC

## 2020-02-19 NOTE — ED Triage Notes (Signed)
  Patient comes in with SOB and L sided pain that started on Monday.  Patient states the pain started in his back and moved more to the side.  Hurts when he takes a deep breath.  Smokes 1 pack of cigarettes a day.  A&O x4.

## 2020-02-19 NOTE — Discharge Instructions (Addendum)
You have been seen today for shortness of breath. Please read and follow all provided instructions. Return to the emergency room for worsening condition or new concerning symptoms including worsening shortness of breath or difficulty breathing, fever not controlled with tylenol, chest pain, unable to eat or drink because of nausea and vomiting  You were tested today for Covid.  The result will be available in 24 hours.  If it is positive you will receive a phone call.  If it is negative it will be available to see online in your MyChart.  You should quarantine until you have the test result. -I included information about COVID-19 in case you test positive, if it is negative please disregard. 1. Medications:  Prescription sent to your pharmacy for Augmentin.  This is an antibiotic used to treat pneumonia.  Please take as prescribed.  Your first dose will be tonight, you had your morning dose in the emergency department.  -Prescription sent to your pharmacy for azithromycin.  This is another antibiotic to treat pneumonia.  Please start taking this tomorrow.  You already received your dose for today.  Continue usual home medications Take medications as prescribed. Please review all of the medicines and only take them if you do not have an allergy to them.   2. Treatment: rest, drink plenty of fluids  3. Follow Up:  Please follow up with primary care provider by scheduling an appointment as soon as possible for a visit     It is also a possibility that you have an allergic reaction to any of the medicines that you have been prescribed - Everybody reacts differently to medications and while MOST people have no trouble with most medicines, you may have a reaction such as nausea, vomiting, rash, swelling, shortness of breath. If this is the case, please stop taking the medicine immediately and contact your physician.  ?

## 2020-02-19 NOTE — ED Notes (Signed)
Patient transported to X-ray 

## 2020-02-19 NOTE — ED Notes (Signed)
Pt ambulated in the hall, O2 sats started at 95% on room air and dropped to 92% while walking. Pt did not complain of shob.

## 2020-02-19 NOTE — ED Provider Notes (Signed)
[place] EMERGENCY DEPARTMENT Provider Note   CSN: [id number] Arrival date & time: 02/19/20  0446     History Chief Complaint  Patient presents with  . Shortness of Breath  . Flank Pain    Gary Frederick is a 64 y.o. male.  Patient with a history of colon CA s/p right hemicolectomy 2014, DM, HLD, HTN presents with left sided pain near the shoulder blade that radiated to lower posterior rib border that started Monday (4 days ago). The pain subsequently started in the anterior and lateral left chest, causing painful breathing. He endorses a mild cough but no fever. He tried taking Aleve, Robitussin and Mucinex without relief. No sick contacts, specifically, no COVID exposures. He is a smoker. No nausea, vomiting, congestion, sore throat.   The history is provided by the patient. No language interpreter was used.  Shortness of Breath Associated symptoms: chest pain   Associated symptoms: no abdominal pain and no fever   Flank Pain Associated symptoms include chest pain and shortness of breath ("hurts to breathe"). Pertinent negatives include no abdominal pain.       Past Medical History:  Diagnosis Date  . Arthritis   . Cancer ([place]) 10/2012   colon  . Constipation   . Diabetes mellitus without complication ([place])   . Gout   . Hyperlipidemia   . Hypertension   . Insomnia     Patient Active Problem List   Diagnosis Date Noted  . Colon cancer ([place]) 10/21/2012  . HYPERLIPIDEMIA 12/04/2006  . LYMPHADENOPATHY 12/04/2006  . CARPAL TUNNEL RELEASE, RIGHT, HX OF 12/04/2006  . GOUT 11/23/2006  . ALCOHOL ABUSE 11/23/2006  . TOBACCO ABUSE 11/23/2006  . HYPERTENSION 11/23/2006    Past Surgical History:  Procedure Laterality Date  . CARPAL TUNNEL RELEASE     right  . COLON RESECTION  12/11/2012   Procedure: LAPAROSCOPIC RIGHT COLON RESECTION;  Surgeon: Leighton Ruff, MD;  Location: [facility];  Service: General;  Laterality: Right;  . COLON SURGERY   12/11/2012  . LIVER ULTRASOUND  12/11/2012   Procedure: LIVER ULTRASOUND;  Surgeon: Leighton Ruff, MD;  Location: [facility];  Service: General;  Laterality: Right;  Intraoperative Liver Ultrasound       History reviewed. No pertinent family history.  Social History   Tobacco Use  . Smoking status: Current Every Day Smoker    Packs/day: 1.00    Years: 30.00    Pack years: 30.00    Types: Cigarettes  . Smokeless tobacco: Never Used  Substance Use Topics  . Alcohol use: Yes    Alcohol/week: 5.0 standard drinks    Types: 5 Cans of beer per week  . Drug use: No    Home Medications Prior to Admission medications   Medication Sig Start Date End Date Taking? Authorizing Provider  amLODipine (NORVASC) 10 MG tablet Take 10 mg by mouth daily with lunch.     [provider]  diclofenac sodium (VOLTAREN) 1 % GEL APPLY TWICE DAILY TO AFFECTED JOINTS 12/14/15   [provider]  ibuprofen (ADVIL,MOTRIN) 400 MG tablet TAKE 1 TABLET 2 OR 3 TIMES A DAY 10/27/17   [provider]  lisinopril (PRINIVIL,ZESTRIL) 40 MG tablet Take 40 mg by mouth daily with lunch.     [provider]  metoprolol succinate (TOPROL-XL) 50 MG 24 hr tablet Take 50 mg by mouth daily.  06/08/13   [provider]  omega-3 acid ethyl esters (LOVAZA) 1 G capsule 2 g daily.  05/28/13   [provider]  ONE TOUCH ULTRA TEST test strip USE 1 STRIP TO [place] DAILY 12/31/18   [provider]    Allergies    Patient has no known allergies.  Review of Systems   Review of Systems  Constitutional: Negative for chills and fever.  HENT: Negative.   Respiratory: Positive for shortness of breath ("hurts to breathe").   Cardiovascular: Positive for chest pain. Negative for leg swelling.  Gastrointestinal: Negative.  Negative for abdominal pain and nausea.  Genitourinary: Negative for flank pain.  Musculoskeletal: Positive for back pain.  Skin: Negative.     Neurological: Negative.  Negative for weakness.    Physical Exam Updated Vital Signs Ht 6\' 3"  (1.905 m)   Wt 115.7 kg   BMI 31.87 kg/m   Physical Exam Vitals and nursing note reviewed.  Constitutional:      Appearance: He is well-developed.  HENT:     Head: Normocephalic.  Cardiovascular:     Rate and Rhythm: Normal rate and regular rhythm.     Heart sounds: No murmur.  Pulmonary:     Effort: Pulmonary effort is normal.     Breath sounds: Normal breath sounds. No wheezing, rhonchi or rales.     Comments: Patient is splinting with inspiration. Chest:     Chest wall: No tenderness.  Abdominal:     General: Bowel sounds are normal.     Palpations: Abdomen is soft.     Tenderness: There is no abdominal tenderness. There is no guarding or rebound.  Musculoskeletal:        General: Normal range of motion.     Cervical back: Normal range of motion and neck supple.     Right lower leg: No edema.     Left lower leg: No edema.  Skin:    General: Skin is warm and dry.     Findings: No rash.  Neurological:     Mental Status: He is alert and oriented to person, place, and time.     ED Results / Procedures / Treatments   Labs (all labs ordered are listed, but only abnormal results are displayed) Labs Reviewed  CBC WITH DIFFERENTIAL/PLATELET  BASIC METABOLIC PANEL   Results for orders placed or performed during the hospital encounter of 02/19/20  CBC with Differential  Result Value Ref Range   WBC 7.8 4.0 - 10.5 K/uL   RBC 5.26 4.22 - 5.81 MIL/uL   Hemoglobin 16.5 13.0 - 17.0 g/dL   HCT 48.2 39.0 - 52.0 %   MCV 91.6 80.0 - 100.0 fL   MCH 31.4 26.0 - 34.0 pg   MCHC 34.2 30.0 - 36.0 g/dL   RDW 12.2 11.5 - 15.5 %   Platelets 214 150 - 400 K/uL   nRBC 0.0 0.0 - 0.2 %   Neutrophils Relative % 64 %   Neutro Abs 5.0 1.7 - 7.7 K/uL   Lymphocytes Relative 23 %   Lymphs Abs 1.8 0.7 - 4.0 K/uL   Monocytes Relative 11 %   Monocytes Absolute 0.9 0.1 - 1.0 K/uL   Eosinophils  Relative 1 %   Eosinophils Absolute 0.1 0.0 - 0.5 K/uL   Basophils Relative 1 %   Basophils Absolute 0.0 0.0 - 0.1 K/uL   Immature Granulocytes 0 %   Abs Immature Granulocytes 0.03 0.00 - 0.07 K/uL  Basic metabolic panel  Result Value Ref Range   Sodium 137 135 - 145 mmol/L   Potassium 3.9 3.5 -  5.1 mmol/L   Chloride 101 98 - 111 mmol/L   CO2 25 22 - 32 mmol/L   Glucose, Bld 161 (H) 70 - 99 mg/dL   BUN 5 (L) 8 - 23 mg/dL   Creatinine, Ser 0.82 0.61 - 1.24 mg/dL   Calcium 9.0 8.9 - 10.3 mg/dL   GFR calc non Af Amer >60 >60 mL/min   GFR calc Af Amer >60 >60 mL/min   Anion gap 11 5 - 15    EKG EKG Interpretation  Date/Time:  Friday February 19 2020 04:51:05 EDT Ventricular Rate:  87 PR Interval:  180 QRS Duration: 92 QT Interval:  374 QTC Calculation: 450 R Axis:   49 Text Interpretation: Normal sinus rhythm Normal ECG S1Q3T3 Confirmed by Merrily Pew [phone]) on 02/19/2020 5:50:26 AM   Radiology DG Chest 2 View  Result Date: 02/19/2020 CLINICAL DATA:  Shortness of breath, began in the back and moved to the side, hurts with deep inspiration, current smoker EXAM: CHEST - 2 VIEW COMPARISON:  CT 08/12/2019, radiograph 12/28/2013 FINDINGS: There is a slightly geometric subpleural consolidation in the left lung. Suspected trace left effusion as well. Right lung is predominantly clear. No pneumothorax. No right effusion. The aorta is calcified. The remaining cardiomediastinal contours are unremarkable. No acute osseous or soft tissue abnormality. Telemetry leads overlie the chest. IMPRESSION: Geometric subpleural opacity in the left lung, could reflect infection or possible Hampton's hump in the setting of pulmonary embolism. Electronically Signed   By: Lovena Le M.D.   On: 02/19/2020 05:44    Procedures Procedures (including critical care time)  Medications Ordered in ED Medications  HYDROmorphone (DILAUDID) injection 1 mg (has no administration in time range)    ED Course  I  have reviewed the triage vital signs and the nursing notes.  Pertinent labs & imaging results that were available during my care of the patient were reviewed by me and considered in my medical decision making (see chart for details).    MDM Rules/Calculators/A&P                      Patient to ED with pain in back, to chest, worse with breathing. Onset over several days. No fever.  He appears uncomfortable with taking even shallow breaths. No hypoxia. He is not tachycardic, however, risk for PE is considered significant with abnormal CXR (?Hampton's Hump), h/o CA and he is a 1 ppd smoker.   Pain medication provided. CTA pending. Patient updated on concerns and plan of evaluation. Of more concern, he saw his oncologist recently and reports his CA markers increased. DDx: PE vs lung mass (less likely with acute onset).   Patient care signed out to [id number], PA-C, pending review of CTA.  Final Clinical Impression(s) / ED Diagnoses Final diagnoses:  None   1. Pleuritic Chest pain 2. Abnormal CXR  Rx / DC Orders ED Discharge Orders    None       Charlann Lange, PA-C 02/19/20 [id number]    Mesner, Corene Cornea, MD 02/20/20 [phone]

## 2020-02-19 NOTE — ED Provider Notes (Signed)
Care assumed from S. Upstill PA-C at shift change pending CTA chest.  See her note for full H&P.   Briefly, this is a 64 yo male presenting with shortness of breath and left side pain x 5 days. Work up is significant for chest xray with subpleural opacity in left lung, hamptom hump vs ?mets. Labs CBC and BMP with no acute findings.  Plan is to follow up on CTA results and disposition accordingly.  Physical Exam  BP 123/75 (BP Location: Right Arm)   Pulse 78   Temp 99.7 F (37.6 C) (Oral)   Resp (!) 30   Ht 6\' 3"  (1.905 m)   Wt 115.7 kg   SpO2 97%   BMI 31.87 kg/m   PE: Constitutional: well-developed, well-nourished, no apparent distress HENT: normocephalic, atraumatic. no cervical adenopathy Cardiovascular: normal rate and rhythm, distal pulses intact Pulmonary/Chest: effort normal; breath sounds clear and equal bilaterally; no wheezes or rales Abdominal: soft and nontender Musculoskeletal: full ROM, no edema Neurological: alert with goal directed thinking Skin: warm and dry, no rash, no diaphoresis Psychiatric: normal mood and affect, normal behavior   Nursing notes and vital signs reviewed.  ED Course/Procedures     EKG Interpretation  Date/Time:  Friday February 19 2020 04:51:05 EDT Ventricular Rate:  87 PR Interval:  180 QRS Duration: 92 QT Interval:  374 QTC Calculation: 450 R Axis:   49 Text Interpretation: Normal sinus rhythm Normal ECG S1Q3T3 Confirmed by Merrily Pew [phone]) on 02/19/2020 5:50:26 AM        Results for orders placed or performed during the hospital encounter of 02/19/20  CBC with Differential  Result Value Ref Range   WBC 7.8 4.0 - 10.5 K/uL   RBC 5.26 4.22 - 5.81 MIL/uL   Hemoglobin 16.5 13.0 - 17.0 g/dL   HCT 48.2 39.0 - 52.0 %   MCV 91.6 80.0 - 100.0 fL   MCH 31.4 26.0 - 34.0 pg   MCHC 34.2 30.0 - 36.0 g/dL   RDW 12.2 11.5 - 15.5 %   Platelets 214 150 - 400 K/uL   nRBC 0.0 0.0 - 0.2 %   Neutrophils Relative % 64 %   Neutro Abs 5.0  1.7 - 7.7 K/uL   Lymphocytes Relative 23 %   Lymphs Abs 1.8 0.7 - 4.0 K/uL   Monocytes Relative 11 %   Monocytes Absolute 0.9 0.1 - 1.0 K/uL   Eosinophils Relative 1 %   Eosinophils Absolute 0.1 0.0 - 0.5 K/uL   Basophils Relative 1 %   Basophils Absolute 0.0 0.0 - 0.1 K/uL   Immature Granulocytes 0 %   Abs Immature Granulocytes 0.03 0.00 - 0.07 K/uL  Basic metabolic panel  Result Value Ref Range   Sodium 137 135 - 145 mmol/L   Potassium 3.9 3.5 - 5.1 mmol/L   Chloride 101 98 - 111 mmol/L   CO2 25 22 - 32 mmol/L   Glucose, Bld 161 (H) 70 - 99 mg/dL   BUN 5 (L) 8 - 23 mg/dL   Creatinine, Ser 0.82 0.61 - 1.24 mg/dL   Calcium 9.0 8.9 - 10.3 mg/dL   GFR calc non Af Amer >60 >60 mL/min   GFR calc Af Amer >60 >60 mL/min   Anion gap 11 5 - 15   DG Chest 2 View  Result Date: 02/19/2020 CLINICAL DATA:  Shortness of breath, began in the back and moved to the side, hurts with deep inspiration, current smoker EXAM: CHEST - 2 VIEW COMPARISON:  CT 08/12/2019, radiograph 12/28/2013 FINDINGS: There is a slightly geometric subpleural consolidation in the left lung. Suspected trace left effusion as well. Right lung is predominantly clear. No pneumothorax. No right effusion. The aorta is calcified. The remaining cardiomediastinal contours are unremarkable. No acute osseous or soft tissue abnormality. Telemetry leads overlie the chest. IMPRESSION: Geometric subpleural opacity in the left lung, could reflect infection or possible Hampton's hump in the setting of pulmonary embolism. Electronically Signed   By: Lovena Le M.D.   On: 02/19/2020 05:44   CT Angio Chest PE W and/or Wo Contrast  Result Date: 02/19/2020 CLINICAL DATA:  Shortness of breath EXAM: CT ANGIOGRAPHY CHEST WITH CONTRAST TECHNIQUE: Multidetector CT imaging of the chest was performed using the standard protocol during bolus administration of intravenous contrast. Multiplanar CT image reconstructions and MIPs were obtained to evaluate the  vascular anatomy. CONTRAST:  <See Chart> OMNIPAQUE IOHEXOL 350 MG/ML SOLN COMPARISON:  08/12/2019 FINDINGS: Cardiovascular: Normal heart size. No pericardial effusion. Extensive atherosclerotic calcification of the aorta and coronaries. There is unfortunate bolus dispersion and respiratory motion affecting pulmonary artery visualization. No definitive pulmonary embolism. No acute aortic finding. Mediastinum/Nodes: Granulomatous type calcifications within hilar and mediastinal lymph nodes. Lungs/Pleura: Subpleural opacity in the lingula which appears to have associated enhancing pulmonary arteries. Streaky opacity in the left more than right lower lobes. Tiny subpleural opacity in the right upper lobe. Trace left pleural effusion. No pulmonary edema. Calcified granuloma in the right lower lobe Upper Abdomen: Negative Musculoskeletal: Spondylosis and glenohumeral osteoarthritis. No acute or aggressive finding. Review of the MIP images confirms the above findings. IMPRESSION: 1. Limited CTA, especially beyond the segmental level. No visible pulmonary embolism. 2. Airspace disease mainly in the lingula with small left pleural effusion and lower lobe atelectasis. Please correlate for pneumonia symptoms. 3. Remote granulomatous disease. 4. Extensive aortic and coronary atherosclerosis Electronically Signed   By: Monte Fantasia M.D.   On: 02/19/2020 07:59   Vitals:   02/19/20 0817 02/19/20 0818 02/19/20 0821 02/19/20 0823  BP:      Pulse: 74 83 85 82  Resp: 20 20 18 17   Temp:      TempSrc:      SpO2: 96% 96% 97% 97%  Weight:      Height:        MDM  6:50 AM Patient care assumed in sign out. Patient resting comfortably while waiting for CTA chest. Pain has improved after IV dilaudid.   7:40 AM Patient returned from CT and pain has returned after moving and laying on the table. Second dose of pain medicine given.  8:15 AM CTA is negative for PE, there is airspace disease with small left sided pleural  effusion and lower left lobe atelectasis. Patient does have productive cough and shortness of breath.  Patient updated on CTA findings.  He states pain is controlled and his work of breathing has improved.  His respiratory rate has also improved and is normal. Patient ambulated in the emergency department without respiratory distress, tachycardia, or hypoxia. SpO2 during ambulation >94% on room air.  Engaged in shared decision making and patient feels comfortable going home with double coverage p.o. antibiotics and incentive spirometer.  First dose of antibiotics given here in the emergency department.  Patient denies any known Covid positive contacts, however in the setting of the pandemic and respiratory infection will also test him for Covid today.  He is aware that result will be available in 6 to 24 hours and will self quarantine  until he has the result.  The patient appears reasonably screened and/or stabilized for discharge and I doubt any other medical condition or other [facility] requiring further screening, evaluation, or treatment in the ED at this time prior to discharge. The patient is safe for discharge with strict return precautions discussed. Recommend pcp follow up in 2-5 days for symptom recheck. Findings and plan of care discussed with supervising physician Dr. Stark Jock.   Gary Frederick was evaluated in Emergency Department on 02/19/2020 for the symptoms described in the history of present illness. He was evaluated in the context of the global COVID-19 pandemic, which necessitated consideration that the patient might be at risk for infection with the SARS-CoV-2 virus that causes COVID-19. Institutional protocols and algorithms that pertain to the evaluation of patients at risk for COVID-19 are in a state of rapid change based on information released by regulatory bodies including the CDC and federal and state organizations. These policies and algorithms were followed during the patient's care in  the ED.   Portions of this note were generated with [employer]. Dictation errors may occur despite best attempts at proofreading.      Cherre Robins, PA-C 02/19/20 [id number]    Veryl Speak, MD 02/19/20 [phone]

## 2020-02-19 NOTE — ED Notes (Signed)
Transported to CT 

## 2020-02-23 ENCOUNTER — Telehealth (HOSPITAL_COMMUNITY): Payer: Self-pay

## 2020-03-18 ENCOUNTER — Ambulatory Visit: Payer: Medicare Other | Attending: Internal Medicine

## 2020-03-18 DIAGNOSIS — Z23 Encounter for immunization: Secondary | ICD-10-CM

## 2020-03-18 NOTE — Progress Notes (Signed)
   Covid-19 Vaccination Clinic  Name:  Gary Frederick    MRN: [id number] DOB: 1956/10/30  03/18/2020  [name] was observed post Covid-19 immunization for 15 minutes without incident. He was provided with Vaccine Information Sheet and instruction to access the V-Safe system.   [name] was instructed to call 911 with any severe reactions post vaccine: Marland Kitchen Difficulty breathing  . Swelling of face and throat  . A fast heartbeat  . A bad rash all over body  . Dizziness and weakness   Immunizations Administered    Name Date Dose VIS Date Route   Pfizer COVID-19 Vaccine 03/18/2020  9:07 AM 0.3 mL 11/13/2019 Intramuscular   Manufacturer: [employer], [employer]   Lot: [id number]   [place]: [id number]

## 2020-04-01 DIAGNOSIS — E7849 Other hyperlipidemia: Secondary | ICD-10-CM | POA: Diagnosis not present

## 2020-04-01 DIAGNOSIS — M109 Gout, unspecified: Secondary | ICD-10-CM | POA: Diagnosis not present

## 2020-04-01 DIAGNOSIS — E118 Type 2 diabetes mellitus with unspecified complications: Secondary | ICD-10-CM | POA: Diagnosis not present

## 2020-04-01 DIAGNOSIS — I10 Essential (primary) hypertension: Secondary | ICD-10-CM | POA: Diagnosis not present

## 2020-04-04 ENCOUNTER — Other Ambulatory Visit: Payer: Self-pay

## 2020-04-04 ENCOUNTER — Inpatient Hospital Stay: Payer: Medicare Other | Attending: Oncology

## 2020-04-04 ENCOUNTER — Telehealth: Payer: Self-pay | Admitting: *Deleted

## 2020-04-04 DIAGNOSIS — C184 Malignant neoplasm of transverse colon: Secondary | ICD-10-CM | POA: Insufficient documentation

## 2020-04-04 LAB — CEA (IN HOUSE-CHCC): CEA (CHCC-In House): 6.76 ng/mL — ABNORMAL HIGH (ref 0.00–5.00)

## 2020-04-04 NOTE — Telephone Encounter (Signed)
Notified patient of his CEA result and that MD wants to check again in 4 months. Made him aware and that smoking can cause this elevation as well. Encourage him to cut back or stop smoking if he can before next lab is drawn in 4 months. Scheduling message sent.

## 2020-04-04 NOTE — Telephone Encounter (Signed)
-----   Message from Ladell Pier, MD sent at 04/04/2020  1:39 PM EDT ----- Please call patient, CEA is lower but remains elevated, likely secondary to smoking, recommend he discontinue smoking and repeat in 4 months

## 2020-04-06 ENCOUNTER — Telehealth: Payer: Self-pay | Admitting: Oncology

## 2020-04-06 NOTE — Telephone Encounter (Signed)
Pt is aware of appt on 9/7. Per 5/3 sch msg.

## 2020-04-11 ENCOUNTER — Ambulatory Visit: Payer: Medicare Other | Attending: Internal Medicine

## 2020-04-11 DIAGNOSIS — Z23 Encounter for immunization: Secondary | ICD-10-CM

## 2020-04-11 NOTE — Progress Notes (Signed)
   Covid-19 Vaccination Clinic  Name:  Gary Frederick    MRN: [id number] DOB: 07-May-1956  04/11/2020  [name] was observed post Covid-19 immunization for 15 minutes without incident. He was provided with Vaccine Information Sheet and instruction to access the V-Safe system.   [name] was instructed to call 911 with any severe reactions post vaccine: Marland Kitchen Difficulty breathing  . Swelling of face and throat  . A fast heartbeat  . A bad rash all over body  . Dizziness and weakness   Immunizations Administered    Name Date Dose VIS Date Route   Pfizer COVID-19 Vaccine 04/11/2020  9:48 AM 0.3 mL 01/27/2019 Intramuscular   Manufacturer: [place]   Lot: [id number]   [place]: [id number]

## 2020-04-18 DIAGNOSIS — E1169 Type 2 diabetes mellitus with other specified complication: Secondary | ICD-10-CM | POA: Diagnosis not present

## 2020-08-02 DIAGNOSIS — E119 Type 2 diabetes mellitus without complications: Secondary | ICD-10-CM | POA: Diagnosis not present

## 2020-08-02 DIAGNOSIS — I10 Essential (primary) hypertension: Secondary | ICD-10-CM | POA: Diagnosis not present

## 2020-08-02 DIAGNOSIS — E7849 Other hyperlipidemia: Secondary | ICD-10-CM | POA: Diagnosis not present

## 2020-08-02 DIAGNOSIS — M109 Gout, unspecified: Secondary | ICD-10-CM | POA: Diagnosis not present

## 2020-08-09 ENCOUNTER — Inpatient Hospital Stay: Payer: Medicare Other | Attending: Hematology and Oncology

## 2020-08-09 ENCOUNTER — Other Ambulatory Visit: Payer: Self-pay

## 2020-08-09 DIAGNOSIS — C184 Malignant neoplasm of transverse colon: Secondary | ICD-10-CM | POA: Insufficient documentation

## 2020-08-09 LAB — CEA (IN HOUSE-CHCC): CEA (CHCC-In House): 5.93 ng/mL — ABNORMAL HIGH (ref 0.00–5.00)

## 2020-08-19 ENCOUNTER — Telehealth: Payer: Self-pay | Admitting: *Deleted

## 2020-08-19 NOTE — Telephone Encounter (Signed)
Left VM for patient w/recent CEA result that was improved. F/U 02/02/21 as scheduled. Notified scheduler that he should have [place] on 02/02/21 and to please add this to his schedule.

## 2020-08-25 DIAGNOSIS — K219 Gastro-esophageal reflux disease without esophagitis: Secondary | ICD-10-CM | POA: Diagnosis not present

## 2020-08-25 DIAGNOSIS — E7849 Other hyperlipidemia: Secondary | ICD-10-CM | POA: Diagnosis not present

## 2020-08-25 DIAGNOSIS — M109 Gout, unspecified: Secondary | ICD-10-CM | POA: Diagnosis not present

## 2020-08-25 DIAGNOSIS — E119 Type 2 diabetes mellitus without complications: Secondary | ICD-10-CM | POA: Diagnosis not present

## 2020-08-25 DIAGNOSIS — I10 Essential (primary) hypertension: Secondary | ICD-10-CM | POA: Diagnosis not present

## 2020-08-29 ENCOUNTER — Ambulatory Visit: Payer: Self-pay | Admitting: Internal Medicine

## 2020-08-29 DIAGNOSIS — Z0001 Encounter for general adult medical examination with abnormal findings: Secondary | ICD-10-CM | POA: Diagnosis not present

## 2020-08-29 DIAGNOSIS — I10 Essential (primary) hypertension: Secondary | ICD-10-CM | POA: Diagnosis not present

## 2020-08-29 DIAGNOSIS — E1169 Type 2 diabetes mellitus with other specified complication: Secondary | ICD-10-CM | POA: Diagnosis not present

## 2020-09-09 ENCOUNTER — Ambulatory Visit: Payer: Self-pay | Admitting: Family

## 2020-09-26 DIAGNOSIS — E1169 Type 2 diabetes mellitus with other specified complication: Secondary | ICD-10-CM | POA: Diagnosis not present

## 2020-09-26 DIAGNOSIS — I10 Essential (primary) hypertension: Secondary | ICD-10-CM | POA: Diagnosis not present

## 2020-11-01 DIAGNOSIS — I10 Essential (primary) hypertension: Secondary | ICD-10-CM | POA: Diagnosis not present

## 2020-11-01 DIAGNOSIS — E7849 Other hyperlipidemia: Secondary | ICD-10-CM | POA: Diagnosis not present

## 2020-11-01 DIAGNOSIS — E118 Type 2 diabetes mellitus with unspecified complications: Secondary | ICD-10-CM | POA: Diagnosis not present

## 2020-11-03 ENCOUNTER — Ambulatory Visit: Payer: Self-pay | Admitting: Internal Medicine

## 2021-01-02 DIAGNOSIS — E118 Type 2 diabetes mellitus with unspecified complications: Secondary | ICD-10-CM | POA: Diagnosis not present

## 2021-01-02 DIAGNOSIS — I10 Essential (primary) hypertension: Secondary | ICD-10-CM | POA: Diagnosis not present

## 2021-01-02 DIAGNOSIS — E7849 Other hyperlipidemia: Secondary | ICD-10-CM | POA: Diagnosis not present

## 2021-01-25 DIAGNOSIS — H2513 Age-related nuclear cataract, bilateral: Secondary | ICD-10-CM | POA: Diagnosis not present

## 2021-01-25 DIAGNOSIS — H524 Presbyopia: Secondary | ICD-10-CM | POA: Diagnosis not present

## 2021-01-25 DIAGNOSIS — E119 Type 2 diabetes mellitus without complications: Secondary | ICD-10-CM | POA: Diagnosis not present

## 2021-01-25 DIAGNOSIS — H1045 Other chronic allergic conjunctivitis: Secondary | ICD-10-CM | POA: Diagnosis not present

## 2021-01-25 DIAGNOSIS — H25013 Cortical age-related cataract, bilateral: Secondary | ICD-10-CM | POA: Diagnosis not present

## 2021-01-25 DIAGNOSIS — E118 Type 2 diabetes mellitus with unspecified complications: Secondary | ICD-10-CM | POA: Diagnosis not present

## 2021-01-25 DIAGNOSIS — H04123 Dry eye syndrome of bilateral lacrimal glands: Secondary | ICD-10-CM | POA: Diagnosis not present

## 2021-01-25 DIAGNOSIS — E7849 Other hyperlipidemia: Secondary | ICD-10-CM | POA: Diagnosis not present

## 2021-01-25 DIAGNOSIS — H35033 Hypertensive retinopathy, bilateral: Secondary | ICD-10-CM | POA: Diagnosis not present

## 2021-01-25 DIAGNOSIS — I10 Essential (primary) hypertension: Secondary | ICD-10-CM | POA: Diagnosis not present

## 2021-01-30 DIAGNOSIS — E7849 Other hyperlipidemia: Secondary | ICD-10-CM | POA: Diagnosis not present

## 2021-01-30 DIAGNOSIS — I10 Essential (primary) hypertension: Secondary | ICD-10-CM | POA: Diagnosis not present

## 2021-01-30 DIAGNOSIS — E118 Type 2 diabetes mellitus with unspecified complications: Secondary | ICD-10-CM | POA: Diagnosis not present

## 2021-01-31 ENCOUNTER — Other Ambulatory Visit: Payer: Self-pay | Admitting: *Deleted

## 2021-01-31 DIAGNOSIS — I10 Essential (primary) hypertension: Secondary | ICD-10-CM | POA: Diagnosis not present

## 2021-01-31 DIAGNOSIS — E782 Mixed hyperlipidemia: Secondary | ICD-10-CM | POA: Diagnosis not present

## 2021-01-31 DIAGNOSIS — C184 Malignant neoplasm of transverse colon: Secondary | ICD-10-CM

## 2021-01-31 DIAGNOSIS — E1165 Type 2 diabetes mellitus with hyperglycemia: Secondary | ICD-10-CM | POA: Diagnosis not present

## 2021-01-31 DIAGNOSIS — Z720 Tobacco use: Secondary | ICD-10-CM | POA: Diagnosis not present

## 2021-02-02 ENCOUNTER — Other Ambulatory Visit: Payer: Self-pay

## 2021-02-02 ENCOUNTER — Inpatient Hospital Stay (HOSPITAL_BASED_OUTPATIENT_CLINIC_OR_DEPARTMENT_OTHER): Payer: Medicare Other | Admitting: Oncology

## 2021-02-02 ENCOUNTER — Inpatient Hospital Stay: Payer: Medicare Other | Attending: Oncology

## 2021-02-02 VITALS — BP 134/95 | HR 77 | Temp 97.8°F | Resp 20 | Ht 75.0 in | Wt 252.6 lb

## 2021-02-02 DIAGNOSIS — K648 Other hemorrhoids: Secondary | ICD-10-CM | POA: Insufficient documentation

## 2021-02-02 DIAGNOSIS — Z9049 Acquired absence of other specified parts of digestive tract: Secondary | ICD-10-CM | POA: Insufficient documentation

## 2021-02-02 DIAGNOSIS — K644 Residual hemorrhoidal skin tags: Secondary | ICD-10-CM | POA: Insufficient documentation

## 2021-02-02 DIAGNOSIS — E119 Type 2 diabetes mellitus without complications: Secondary | ICD-10-CM | POA: Insufficient documentation

## 2021-02-02 DIAGNOSIS — C184 Malignant neoplasm of transverse colon: Secondary | ICD-10-CM

## 2021-02-02 DIAGNOSIS — F17210 Nicotine dependence, cigarettes, uncomplicated: Secondary | ICD-10-CM | POA: Insufficient documentation

## 2021-02-02 DIAGNOSIS — M109 Gout, unspecified: Secondary | ICD-10-CM | POA: Diagnosis not present

## 2021-02-02 DIAGNOSIS — R109 Unspecified abdominal pain: Secondary | ICD-10-CM | POA: Diagnosis not present

## 2021-02-02 DIAGNOSIS — Z79899 Other long term (current) drug therapy: Secondary | ICD-10-CM | POA: Insufficient documentation

## 2021-02-02 DIAGNOSIS — D127 Benign neoplasm of rectosigmoid junction: Secondary | ICD-10-CM | POA: Diagnosis not present

## 2021-02-02 LAB — CEA (IN HOUSE-CHCC): CEA (CHCC-In House): 4.65 ng/mL (ref 0.00–5.00)

## 2021-02-02 NOTE — Progress Notes (Signed)
  [facility] OFFICE PROGRESS NOTE   Diagnosis: Colon cancer  INTERVAL HISTORY:   Gary Frederick returns as scheduled.  He feels well.  No difficulty with bowel function.  Good appetite.  He recently had an episode of abdominal discomfort when his diabetes medication was changed.  He has received the COVID-19 vaccines.  He continues smoking.  Objective:  Vital signs in last 24 hours:  Blood pressure (!) 134/95, pulse 77, temperature 97.8 F (36.6 C), temperature source Tympanic, resp. rate 20, height $RemoveBe'6\' 3"'lcJcconxx$  (1.905 m), weight 252 lb 9.6 oz (114.6 kg), SpO2 98 %.    Lymphatics: No cervical, supraclavicular, axillary, or inguinal nodes Resp: Lungs clear bilaterally Cardio: Regular rate and rhythm GI: Nontender, no mass, no hepatosplenomegaly Vascular: No leg edema   Lab Results:  Lab Results  Component Value Date   WBC 7.8 02/19/2020   HGB 16.5 02/19/2020   HCT 48.2 02/19/2020   MCV 91.6 02/19/2020   PLT 214 02/19/2020   NEUTROABS 5.0 02/19/2020    CMP  Lab Results  Component Value Date   NA 137 02/19/2020   K 3.9 02/19/2020   CL 101 02/19/2020   CO2 25 02/19/2020   GLUCOSE 161 (H) 02/19/2020   BUN 5 (L) 02/19/2020   CREATININE 0.82 02/19/2020   CALCIUM 9.0 02/19/2020   GFRNONAA >60 02/19/2020   GFRAA >60 02/19/2020    Lab Results  Component Value Date   CEA1 4.65 02/02/2021    Medications: I have reviewed the patient's current medications.   Assessment/Plan:  1. Adenocarcinoma transverse colon , status post a right colectomy on 12/11/2012, stage II (T3 N0) well differentiated adenocarcinoma, the tumor is microsatellite stable and no loss of expression of mismatch repair proteins. 2. Indeterminate right liver lesion on a CT 10/13/2012, status post intraoperative ultrasound and biopsy on 12/11/2012 with no malignancy found; stable liver on a CT of the abdomen May 2014.  3. Mildly elevated preoperative CEA. 4. Gout.  5. History of cutaneous  nodular lesions-likely sebaceous cysts versus sarcoidosis.  6. Calcified chest lymph nodes on a CT 10/27/2012, "granuloma "found on the surgical pathology from the liver biopsy 12/11/2012-? Sarcoidosis. The ACE level was not elevated on 01/13/2013. 7. Tobacco use. He was encouraged to stop smoking.  Followed in the lung cancer screening program. 8. Colonoscopy 09/22/2013. 5 sessile polyps were found in the rectum and in the transverse colon. The polyps 2-3 mm in size. External and internal hemorrhoids  Colonoscopy 07/07/2019-eight 2-4 mm polyps in the rectum, rectosigmoid, descending, and transverse colon removed-tubular adenomas, sessile serrated adenoma     Disposition: Gary Frederick remains in clinical remission from colon cancer.  He has a history of chronic mild elevation of the CEA, likely related to tobacco use.  The CEA is normal today.  He would like to continue follow-up at the Cancer center.  He will return for an office visit in 1 year.  He continues smoking.  I will refer him back to the lung cancer screening program.  He continues colonoscopy surveillance with Dr. Benson Norway.  Betsy Coder, MD  02/02/2021  1:09 PM

## 2021-02-03 ENCOUNTER — Telehealth: Payer: Self-pay

## 2021-02-03 ENCOUNTER — Telehealth: Payer: Self-pay | Admitting: *Deleted

## 2021-02-03 NOTE — Telephone Encounter (Signed)
Called made pt aware of latest CEA results and follow-up scheduled

## 2021-02-03 NOTE — Telephone Encounter (Signed)
Called to request the CEA results and what it means. Informed his is in normal range. He reports that he is a smoker.

## 2021-02-06 ENCOUNTER — Telehealth: Payer: Self-pay | Admitting: Oncology

## 2021-02-06 ENCOUNTER — Other Ambulatory Visit: Payer: Self-pay | Admitting: *Deleted

## 2021-02-06 DIAGNOSIS — Z87891 Personal history of nicotine dependence: Secondary | ICD-10-CM

## 2021-02-06 DIAGNOSIS — F17210 Nicotine dependence, cigarettes, uncomplicated: Secondary | ICD-10-CM

## 2021-02-06 NOTE — Telephone Encounter (Signed)
Scheduled appointments per 3/3 los. Spoke to patient's wife who is aware of appointments date and times.

## 2021-02-21 ENCOUNTER — Ambulatory Visit
Admission: RE | Admit: 2021-02-21 | Discharge: 2021-02-21 | Disposition: A | Discharge status: Home / Self Care | Payer: Medicare Other | Source: Ambulatory Visit | Attending: Acute Care | Admitting: Acute Care

## 2021-02-21 DIAGNOSIS — I898 Other specified noninfective disorders of lymphatic vessels and lymph nodes: Secondary | ICD-10-CM | POA: Diagnosis not present

## 2021-02-21 DIAGNOSIS — I2510 Atherosclerotic heart disease of native coronary artery without angina pectoris: Secondary | ICD-10-CM | POA: Diagnosis not present

## 2021-02-21 DIAGNOSIS — Z87891 Personal history of nicotine dependence: Secondary | ICD-10-CM

## 2021-02-21 DIAGNOSIS — I708 Atherosclerosis of other arteries: Secondary | ICD-10-CM | POA: Diagnosis not present

## 2021-02-21 DIAGNOSIS — F17210 Nicotine dependence, cigarettes, uncomplicated: Secondary | ICD-10-CM | POA: Diagnosis not present

## 2021-03-14 NOTE — Progress Notes (Signed)
I have called and spoken with the patient . I explained his scan was read as a Lung  RADS 3, nodules that are probably benign findings, short term follow up suggested: includes nodules with a low likelihood of becoming a clinically active cancer. Radiology recommends a 6 month repeat LDCT follow up. I told him need to do a shorter interval follow up in 6 months to check for stability of the nodule. He verbalized understanding and had no questions at completion of the call. I told him we will call closer to the time ( 08/2021) to get the scan scheduled.  Please fax results to PCP and place order for 6 month follow up. Thanks so much

## 2021-03-16 ENCOUNTER — Other Ambulatory Visit: Payer: Self-pay | Admitting: *Deleted

## 2021-03-16 DIAGNOSIS — Z87891 Personal history of nicotine dependence: Secondary | ICD-10-CM

## 2021-03-16 DIAGNOSIS — F17210 Nicotine dependence, cigarettes, uncomplicated: Secondary | ICD-10-CM

## 2021-03-28 DIAGNOSIS — I10 Essential (primary) hypertension: Secondary | ICD-10-CM | POA: Diagnosis not present

## 2021-03-28 DIAGNOSIS — E1165 Type 2 diabetes mellitus with hyperglycemia: Secondary | ICD-10-CM | POA: Diagnosis not present

## 2021-03-28 DIAGNOSIS — E1169 Type 2 diabetes mellitus with other specified complication: Secondary | ICD-10-CM | POA: Diagnosis not present

## 2021-03-28 DIAGNOSIS — M545 Low back pain, unspecified: Secondary | ICD-10-CM | POA: Diagnosis not present

## 2021-03-28 DIAGNOSIS — E782 Mixed hyperlipidemia: Secondary | ICD-10-CM | POA: Diagnosis not present

## 2021-03-29 ENCOUNTER — Ambulatory Visit
Admission: RE | Admit: 2021-03-29 | Discharge: 2021-03-29 | Disposition: A | Discharge status: Home / Self Care | Payer: Medicare Other | Source: Ambulatory Visit | Attending: Family Medicine | Admitting: Family Medicine

## 2021-03-29 ENCOUNTER — Other Ambulatory Visit: Payer: Self-pay | Admitting: Family Medicine

## 2021-03-29 DIAGNOSIS — M79604 Pain in right leg: Secondary | ICD-10-CM

## 2021-03-29 DIAGNOSIS — M545 Low back pain, unspecified: Secondary | ICD-10-CM | POA: Diagnosis not present

## 2021-04-01 DIAGNOSIS — E7849 Other hyperlipidemia: Secondary | ICD-10-CM | POA: Diagnosis not present

## 2021-04-01 DIAGNOSIS — E118 Type 2 diabetes mellitus with unspecified complications: Secondary | ICD-10-CM | POA: Diagnosis not present

## 2021-04-01 DIAGNOSIS — I10 Essential (primary) hypertension: Secondary | ICD-10-CM | POA: Diagnosis not present

## 2021-05-02 DIAGNOSIS — E118 Type 2 diabetes mellitus with unspecified complications: Secondary | ICD-10-CM | POA: Diagnosis not present

## 2021-05-02 DIAGNOSIS — E7849 Other hyperlipidemia: Secondary | ICD-10-CM | POA: Diagnosis not present

## 2021-05-02 DIAGNOSIS — I10 Essential (primary) hypertension: Secondary | ICD-10-CM | POA: Diagnosis not present

## 2021-05-09 DIAGNOSIS — R300 Dysuria: Secondary | ICD-10-CM | POA: Diagnosis not present

## 2021-05-09 DIAGNOSIS — E1165 Type 2 diabetes mellitus with hyperglycemia: Secondary | ICD-10-CM | POA: Diagnosis not present

## 2021-06-01 DIAGNOSIS — E118 Type 2 diabetes mellitus with unspecified complications: Secondary | ICD-10-CM | POA: Diagnosis not present

## 2021-06-01 DIAGNOSIS — E7849 Other hyperlipidemia: Secondary | ICD-10-CM | POA: Diagnosis not present

## 2021-06-01 DIAGNOSIS — I10 Essential (primary) hypertension: Secondary | ICD-10-CM | POA: Diagnosis not present

## 2021-06-06 DIAGNOSIS — R7401 Elevation of levels of liver transaminase levels: Secondary | ICD-10-CM | POA: Diagnosis not present

## 2021-06-06 DIAGNOSIS — E1169 Type 2 diabetes mellitus with other specified complication: Secondary | ICD-10-CM | POA: Diagnosis not present

## 2021-06-06 DIAGNOSIS — I10 Essential (primary) hypertension: Secondary | ICD-10-CM | POA: Diagnosis not present

## 2021-07-02 DIAGNOSIS — I10 Essential (primary) hypertension: Secondary | ICD-10-CM | POA: Diagnosis not present

## 2021-07-02 DIAGNOSIS — E118 Type 2 diabetes mellitus with unspecified complications: Secondary | ICD-10-CM | POA: Diagnosis not present

## 2021-07-02 DIAGNOSIS — E7849 Other hyperlipidemia: Secondary | ICD-10-CM | POA: Diagnosis not present

## 2021-07-11 ENCOUNTER — Ambulatory Visit
Admission: RE | Admit: 2021-07-11 | Discharge: 2021-07-11 | Disposition: A | Discharge status: Home / Self Care | Payer: Medicare Other | Source: Ambulatory Visit | Attending: Family Medicine | Admitting: Family Medicine

## 2021-07-11 ENCOUNTER — Other Ambulatory Visit: Payer: Self-pay | Admitting: Family Medicine

## 2021-07-11 ENCOUNTER — Other Ambulatory Visit: Payer: Self-pay

## 2021-07-11 DIAGNOSIS — M545 Low back pain, unspecified: Secondary | ICD-10-CM | POA: Diagnosis not present

## 2021-07-11 DIAGNOSIS — M5416 Radiculopathy, lumbar region: Secondary | ICD-10-CM | POA: Diagnosis not present

## 2021-07-11 DIAGNOSIS — R52 Pain, unspecified: Secondary | ICD-10-CM

## 2021-07-11 DIAGNOSIS — E1169 Type 2 diabetes mellitus with other specified complication: Secondary | ICD-10-CM | POA: Diagnosis not present

## 2021-07-26 DIAGNOSIS — M47816 Spondylosis without myelopathy or radiculopathy, lumbar region: Secondary | ICD-10-CM | POA: Diagnosis not present

## 2021-07-26 DIAGNOSIS — I10 Essential (primary) hypertension: Secondary | ICD-10-CM | POA: Diagnosis not present

## 2021-08-02 DIAGNOSIS — M545 Low back pain, unspecified: Secondary | ICD-10-CM | POA: Diagnosis not present

## 2021-08-02 DIAGNOSIS — I10 Essential (primary) hypertension: Secondary | ICD-10-CM | POA: Diagnosis not present

## 2021-08-02 DIAGNOSIS — E7849 Other hyperlipidemia: Secondary | ICD-10-CM | POA: Diagnosis not present

## 2021-08-02 DIAGNOSIS — M47816 Spondylosis without myelopathy or radiculopathy, lumbar region: Secondary | ICD-10-CM | POA: Diagnosis not present

## 2021-08-02 DIAGNOSIS — E118 Type 2 diabetes mellitus with unspecified complications: Secondary | ICD-10-CM | POA: Diagnosis not present

## 2021-08-14 DIAGNOSIS — M48062 Spinal stenosis, lumbar region with neurogenic claudication: Secondary | ICD-10-CM | POA: Diagnosis not present

## 2021-08-18 ENCOUNTER — Other Ambulatory Visit: Payer: Self-pay

## 2021-08-18 ENCOUNTER — Ambulatory Visit: Payer: Medicare Other | Attending: Neurosurgery | Admitting: Physical Therapy

## 2021-08-18 ENCOUNTER — Encounter: Payer: Self-pay | Admitting: Physical Therapy

## 2021-08-18 DIAGNOSIS — M6281 Muscle weakness (generalized): Secondary | ICD-10-CM | POA: Diagnosis not present

## 2021-08-18 DIAGNOSIS — M5442 Lumbago with sciatica, left side: Secondary | ICD-10-CM | POA: Insufficient documentation

## 2021-08-18 DIAGNOSIS — M5441 Lumbago with sciatica, right side: Secondary | ICD-10-CM | POA: Diagnosis not present

## 2021-08-18 DIAGNOSIS — G8929 Other chronic pain: Secondary | ICD-10-CM | POA: Diagnosis not present

## 2021-08-18 DIAGNOSIS — R262 Difficulty in walking, not elsewhere classified: Secondary | ICD-10-CM | POA: Diagnosis not present

## 2021-08-18 DIAGNOSIS — M2560 Stiffness of unspecified joint, not elsewhere classified: Secondary | ICD-10-CM | POA: Diagnosis not present

## 2021-08-18 NOTE — Therapy (Signed)
[place], [place], [address] Phone: [phone]   Fax:  [phone]  Physical Therapy Evaluation  Patient Details  Name: Gary Frederick MRN: [id number] Date of Birth: 12-08-1955 Referring Provider (PT): Consuella Lose MD   Encounter Date: 08/18/2021   PT End of Session - 08/18/21 1141     Visit Number 1    Number of Visits 12    Date for PT Re-Evaluation 09/29/21    Authorization Type UHC MCR and MCD    PT Start Time [id number]    PT Stop Time [id number]    PT Time Calculation (min) 50 min             Past Medical History:  Diagnosis Date   Arthritis    Cancer ([place]) 10/2012   colon   Constipation    Diabetes mellitus without complication ([place])    Gout    Hyperlipidemia    Hypertension    Insomnia     Past Surgical History:  Procedure Laterality Date   CARPAL TUNNEL RELEASE     right   COLON RESECTION  12/11/2012   Procedure: LAPAROSCOPIC RIGHT COLON RESECTION;  Surgeon: Leighton Ruff, MD;  Location: [facility];  Service: General;  Laterality: Right;   COLON SURGERY  12/11/2012   LIVER ULTRASOUND  12/11/2012   Procedure: LIVER ULTRASOUND;  Surgeon: Leighton Ruff, MD;  Location: [facility];  Service: General;  Laterality: Right;  Intraoperative Liver Ultrasound    There were no vitals filed for this visit.    Subjective Assessment - 08/18/21 1057     Subjective Patient here for chronic low back pain which has been ongoing for many years.  He reports pain in both legs and arms  which began about 3 mos ago.  Since then the pain in legs is a little better but continues to limit him in ability to do yardwork, walking, standing even with basic ADLs.  He has to reposition frequently when sitting.  He thinks he could use a new mattress as well.  He denies weakness but L knee occ buckles due to arthritis in knee.  Denies sensroy changes in LEs but pain is radiating from back to hip and buttocks and occ.   calves.    Limitations Lifting;Standing;Walking;House hold activities;Other (comment)   sleeping   Diagnostic tests XR recently    Patient Stated Goals Patient would like to walk better and lose some of this pain    Currently in Pain? Yes    Pain Score 8     Pain Location Back    Pain Orientation Lower    Pain Descriptors / Indicators Aching;Dull;Tightness    Pain Type Chronic pain    Pain Radiating Towards both legs proximal thighs    Pain Onset More than a month ago    Aggravating Factors  stand, activity    Pain Relieving Factors meds, massage, has not tried heating pad    Effect of Pain on Daily Activities recreation, home tasks    Multiple Pain Sites No                [facility] PT Assessment - 08/18/21 0001       Assessment   Medical Diagnosis lumbar spondylosis    Referring Provider (PT) Consuella Lose MD    Next MD Visit next week for injection    Prior Therapy Yes long ago 25 yrs      Precautions   Precautions None  Balance Screen   Has the patient fallen in the past 6 months Yes    How many times? 1    Has the patient had a decrease in activity level because of a fear of falling?  No    Is the patient reluctant to leave their home because of a fear of falling?  No      Home Environment   Living Environment Private residence    Living Arrangements Spouse/significant other    Type of [place] Access Level entry    Home Layout One level    [place] - single point      Prior Function   Level of [place] On disability    Leisure limited, stays at home for the most part      Cognition   Overall Cognitive Status Within Functional Limits for tasks assessed      Observation/Other Assessments   Focus on Therapeutic Outcomes (FOTO)  48%      Sensation   Light Touch Appears Intact      Coordination   Gross Motor Movements are Fluid and Coordinated Not tested      AROM   Right Knee Flexion 115     Left Knee Flexion 105    Lumbar Flexion just below knee   pain   Lumbar Extension 75%    Lumbar - Right Side Bend WNL    Lumbar - Left Side Bend WNL    Lumbar - Right Rotation 50% pain    Lumbar - Left Rotation 50% pain      Strength   Right Hip Flexion 4/5    Right Hip Extension 3-/5    Right Hip ABduction 3+/5    Left Hip Flexion 4-/5    Left Hip Extension 3-/5    Left Hip ABduction 4/5    Right Knee Flexion 5/5    Right Knee Extension 5/5    Left Knee Flexion 4+/5    Left Knee Extension 4+/5      Flexibility   Hamstrings 90/90 Rt 54, Lt 50 deg    Quadriceps tight , hip flexors as well with prone testing      Palpation   Spinal mobility hypomobile thorughout, painful low lumbar    Palpation comment pain throughout lumbar paraspinals, in particular L5 into bilateral glutes, piriformis and hamstrings      Transfers   Five time sit to stand comments  20 sec      Ambulation/Gait   Gait Pattern Decreased stance time - left;Antalgic;Decreased trunk rotation;Wide base of support                        Objective measurements completed on examination: See above findings.         PT Long Term Goals - 08/18/21 1243       PT LONG TERM GOAL #1   Title pt will be able to perform HEP independently for long term pain mgmt    Time 6    Period Weeks    Status New    Target Date 09/29/21      PT LONG TERM GOAL #2   Title FOTO score will improve to 60% or more in order to show increased mobility    Baseline 48%    Time 6    Period Weeks    Status New    Target Date 09/29/21  PT LONG TERM GOAL #3   Title Pt will increase ability to stand for ADLs without increasing back or leg pain for up to 20 min    Baseline can be 7/10+    Time 6    Period Weeks    Status New    Target Date 09/29/21      PT LONG TERM GOAL #4   Title Pt will be able to increase strength in hips to 4+/5 in extension and abduction without increasing back pain in prder to tolerate  community ambulation.    Time 6    Period Weeks    Status New    Target Date 09/29/21                    Plan - 08/18/21 1136     Clinical Impression Statement Patient presents for mod complexity eval of bilateral low back pain with LE radicular symptoms.  The pain varies from Rt to [facility].  He has stiffness throughout spine, hips and weakness in hips.  Pain in lumbar increases with hip extension. Limited by knee pain as well.    His FOTO score is  48%  He should benefit from skilled PT in order to instil new habits for physical activity and HEP.    Personal Factors and Comorbidities Comorbidity 3+;Past/Current Experience;Time since onset of injury/illness/exacerbation    Comorbidities knee OA, diabetes, HTN, chronic pain and disability    Examination-Activity Limitations Carry;Lift;Sit;Stand;Sleep;Locomotion Level;Bed Mobility;Bend;Squat;Transfers;Stairs    Examination-Participation Restrictions Community Activity;Shop;Interpersonal Relationship;Yard Work;Meal Prep    Stability/Clinical Decision Making Evolving/Moderate complexity    Clinical Decision Making Moderate    Rehab Potential Excellent    PT Frequency 2x / week    PT Duration 6 weeks    PT Treatment/Interventions ADLs/Self Care Home Management;Electrical Stimulation;Cryotherapy;Moist Heat;Traction;Functional mobility training;Therapeutic exercise;Therapeutic activities;Balance training;Patient/family education;Manual techniques;Passive range of motion;Dry needling;Spinal Manipulations    PT Next Visit Plan check HEP, Nustep, ant hip stretching, core, standing as tolerated    PT Home Exercise Plan Access Code: [email or web address]  URL: [email or web address]  Date: 08/18/2021  Prepared by: Raeford Razor    Exercises  Supine Posterior Pelvic Tilt - 2-3 x daily - 7 x weekly - 2 sets - 10 reps - 5 hold  Supine Lower Trunk Rotation - 2-3 x daily - 7 x weekly - 2 sets - 10 reps - 10 hold  Supine Hamstring Stretch with Strap -  1-2 x daily - 7 x weekly - 1 sets - 5 reps - 30 hold  Seated Hamstring Stretch - 1-2 x daily - 7 x weekly - 1 sets - 5 reps - 30 hold    Consulted and Agree with Plan of Care Patient             Patient will benefit from skilled therapeutic intervention in order to improve the following deficits and impairments:  Abnormal gait, Decreased activity tolerance, Decreased endurance, Decreased range of motion, Decreased strength, Hypomobility, Increased fascial restricitons, Pain, Postural dysfunction, Impaired flexibility, Difficulty walking, Decreased mobility  Visit Diagnosis: Chronic bilateral low back pain with bilateral sciatica  Joint stiffness of spine  Muscle weakness (generalized)  Difficulty in walking, not elsewhere classified     Problem List Patient Active Problem List   Diagnosis Date Noted   Colon cancer ([place]) 10/21/2012   HYPERLIPIDEMIA 12/04/2006   LYMPHADENOPATHY 12/04/2006   CARPAL TUNNEL RELEASE, RIGHT, HX OF 12/04/2006   GOUT 11/23/2006   ALCOHOL ABUSE 11/23/2006   TOBACCO ABUSE 11/23/2006  HYPERTENSION 11/23/2006    Gary Frederick, PT 08/18/2021, 12:53 PM  [facility] [address] [place], [place], [address] Phone: [phone]   Fax:  [phone]  Name: Gary Frederick MRN: [id number] Date of Birth: 05-31-1956  Raeford Razor, PT 08/18/21 12:53 PM Phone: [phone] Fax: [phone]

## 2021-08-18 NOTE — Patient Instructions (Signed)
Access Code: [email or web address] URL: [email or web address] Date: 08/18/2021 Prepared by: Raeford Razor  Exercises Supine Posterior Pelvic Tilt - 2-3 x daily - 7 x weekly - 2 sets - 10 reps - 5 hold Supine Lower Trunk Rotation - 2-3 x daily - 7 x weekly - 2 sets - 10 reps - 10 hold Supine Hamstring Stretch with Strap - 1-2 x daily - 7 x weekly - 1 sets - 5 reps - 30 hold Seated Hamstring Stretch - 1-2 x daily - 7 x weekly - 1 sets - 5 reps - 30 hold

## 2021-08-28 DIAGNOSIS — M48062 Spinal stenosis, lumbar region with neurogenic claudication: Secondary | ICD-10-CM | POA: Diagnosis not present

## 2021-08-30 ENCOUNTER — Other Ambulatory Visit: Payer: Self-pay

## 2021-08-30 ENCOUNTER — Ambulatory Visit: Payer: Medicare Other | Admitting: Physical Therapy

## 2021-08-30 ENCOUNTER — Encounter: Payer: Self-pay | Admitting: Physical Therapy

## 2021-08-30 DIAGNOSIS — M6281 Muscle weakness (generalized): Secondary | ICD-10-CM | POA: Diagnosis not present

## 2021-08-30 DIAGNOSIS — G8929 Other chronic pain: Secondary | ICD-10-CM

## 2021-08-30 DIAGNOSIS — M5441 Lumbago with sciatica, right side: Secondary | ICD-10-CM

## 2021-08-30 DIAGNOSIS — M2560 Stiffness of unspecified joint, not elsewhere classified: Secondary | ICD-10-CM | POA: Diagnosis not present

## 2021-08-30 DIAGNOSIS — R262 Difficulty in walking, not elsewhere classified: Secondary | ICD-10-CM | POA: Diagnosis not present

## 2021-08-30 DIAGNOSIS — M5442 Lumbago with sciatica, left side: Secondary | ICD-10-CM | POA: Diagnosis not present

## 2021-08-30 NOTE — Therapy (Signed)
[place], [place], [address] Phone: [phone]   Fax:  [phone]  Physical Therapy Treatment  Patient Details  Name: Gary Frederick MRN: [id number] Date of Birth: 03-14-1956 Referring Provider (PT): Consuella Lose MD   Encounter Date: 08/30/2021   PT End of Session - 08/30/21 1109     Visit Number 2    Number of Visits 12    Date for PT Re-Evaluation 09/29/21    Authorization Type UHC MCR and MCD    PT Start Time 1104    PT Stop Time 7035    PT Time Calculation (min) 41 min             Past Medical History:  Diagnosis Date   Arthritis    Cancer ([place]) 10/2012   colon   Constipation    Diabetes mellitus without complication ([place])    Gout    Hyperlipidemia    Hypertension    Insomnia     Past Surgical History:  Procedure Laterality Date   CARPAL TUNNEL RELEASE     right   COLON RESECTION  12/11/2012   Procedure: LAPAROSCOPIC RIGHT COLON RESECTION;  Surgeon: Leighton Ruff, MD;  [facility];  Service: General;  Laterality: Right;   COLON SURGERY  12/11/2012   LIVER ULTRASOUND  12/11/2012   Procedure: LIVER ULTRASOUND;  Surgeon: Leighton Ruff, MD;  [facility];  Service: General;  Laterality: Right;  Intraoperative Liver Ultrasound    There were no vitals filed for this visit.   Subjective Assessment - 08/30/21 1108     Subjective I already feel so much better . I had an injection the other day too.  I have been doing the exercises and they help, 2 x per day.    Currently in Pain? No/denies           Skilled therapy interventions:   Therapeutic Exercise: NuStep 6 min L 8 UE and LE  Treadmill 1.8- 2.0 mph while discussing how to incorporate at home, he has a treadmill   Supine Posterior Pelvic Tilt - - 2 sets - 10 reps - 5 hold Supine Lower Trunk Rotation -  - 1 sets - 10 reps - 10 hold Supine Hamstring Stretch with Strap -  - 1 sets - 3 reps - 30  hold Seated Hamstring Stretch -1 sets 2 reps - 30 hold  Supine core/physio ball  Overhead lift 8lb lat pull over, legs on ball   Ball press x 10 bilateral UE on ball  LTR x 10   Hamstring curl  Bridge with ball x 10 knee sl. Bent   Self care/Pt. Education:  Walking at home on TM  try 2 x 10 min        PT Long Term Goals - 08/18/21 1243       PT LONG TERM GOAL #1   Title pt will be able to perform HEP independently for long term pain mgmt    Time 6    Period Weeks    Status New    Target Date 09/29/21      PT LONG TERM GOAL #2   Title FOTO score will improve to 60% or more in order to show increased mobility    Baseline 48%    Time 6    Period Weeks    Status New    Target Date 09/29/21      PT LONG TERM GOAL #3   Title Pt will  increase ability to stand for ADLs without increasing back or leg pain for up to 20 min    Baseline can be 7/10+    Time 6    Period Weeks    Status New    Target Date 09/29/21      PT LONG TERM GOAL #4   Title Pt will be able to increase strength in hips to 4+/5 in extension and abduction without increasing back pain in prder to tolerate community ambulation.    Time 6    Period Weeks    Status New    Target Date 09/29/21                   Plan - 08/30/21 1109     Clinical Impression Statement Patient has been feeling overall less pain in back and legs with functional activity.  He has been doing the HEP 2-3 times per day. Tightness in anterior hips, weakness in post hip addressed today.  No increase in pain after session, needs cues to posteriorly tilt pelvis prior to bridge to avoid back discomfort.    PT Treatment/Interventions ADLs/Self Care Home Management;Electrical Stimulation;Cryotherapy;Moist Heat;Traction;Functional mobility training;Therapeutic exercise;Therapeutic activities;Balance training;Patient/family education;Manual techniques;Passive range of motion;Dry needling;Spinal Manipulations    PT Next Visit Plan  explain FOTO. check HEP, Nustep, ant hip stretching, core, standing as tolerated    PT Home Exercise Plan Access Code: [email or web address]    Consulted and Agree with Plan of Care Patient             Patient will benefit from skilled therapeutic intervention in order to improve the following deficits and impairments:  Abnormal gait, Decreased activity tolerance, Decreased endurance, Decreased range of motion, Decreased strength, Hypomobility, Increased fascial restricitons, Pain, Postural dysfunction, Impaired flexibility, Difficulty walking, Decreased mobility  Visit Diagnosis: Chronic bilateral low back pain with bilateral sciatica  Joint stiffness of spine  Muscle weakness (generalized)  Difficulty in walking, not elsewhere classified     Problem List Patient Active Problem List   Diagnosis Date Noted   Colon cancer ([place]) 10/21/2012   HYPERLIPIDEMIA 12/04/2006   LYMPHADENOPATHY 12/04/2006   CARPAL TUNNEL RELEASE, RIGHT, HX OF 12/04/2006   GOUT 11/23/2006   ALCOHOL ABUSE 11/23/2006   TOBACCO ABUSE 11/23/2006   HYPERTENSION 11/23/2006    Siris Hoos, PT 08/30/2021, 11:54 AM  [facility] [address] [place], [place], [address] Phone: [phone]   Fax:  [phone]  Name: Gary Frederick MRN: [id number] Date of Birth: 02-18-1956  Raeford Razor, PT 08/30/21 11:54 AM Phone: [phone] Fax: [phone]

## 2021-09-01 ENCOUNTER — Ambulatory Visit: Payer: Medicare Other

## 2021-09-01 ENCOUNTER — Other Ambulatory Visit: Payer: Self-pay

## 2021-09-01 DIAGNOSIS — M6281 Muscle weakness (generalized): Secondary | ICD-10-CM

## 2021-09-01 DIAGNOSIS — M2560 Stiffness of unspecified joint, not elsewhere classified: Secondary | ICD-10-CM | POA: Diagnosis not present

## 2021-09-01 DIAGNOSIS — R262 Difficulty in walking, not elsewhere classified: Secondary | ICD-10-CM | POA: Diagnosis not present

## 2021-09-01 DIAGNOSIS — E118 Type 2 diabetes mellitus with unspecified complications: Secondary | ICD-10-CM | POA: Diagnosis not present

## 2021-09-01 DIAGNOSIS — M5442 Lumbago with sciatica, left side: Secondary | ICD-10-CM

## 2021-09-01 DIAGNOSIS — M5441 Lumbago with sciatica, right side: Secondary | ICD-10-CM | POA: Diagnosis not present

## 2021-09-01 DIAGNOSIS — I10 Essential (primary) hypertension: Secondary | ICD-10-CM | POA: Diagnosis not present

## 2021-09-01 DIAGNOSIS — G8929 Other chronic pain: Secondary | ICD-10-CM

## 2021-09-01 DIAGNOSIS — E7849 Other hyperlipidemia: Secondary | ICD-10-CM | POA: Diagnosis not present

## 2021-09-01 NOTE — Patient Instructions (Signed)
  [place]

## 2021-09-01 NOTE — Therapy (Signed)
Gary Frederick, [place], [address] Phone: [phone]   Fax:  [phone]  Physical Therapy Treatment  Patient Details  Name: Gary Frederick MRN: [id number] Date of Birth: 12-Dec-1955 Referring Provider (PT): Consuella Lose MD   Encounter Date: 09/01/2021   PT End of Session - 09/01/21 1158     Visit Number 3    Number of Visits 12    Date for PT Re-Evaluation 09/29/21    Authorization Type UHC MCR and MCD    PT Start Time 1130    PT Stop Time 0175    PT Time Calculation (min) 45 min    Activity Tolerance Patient tolerated treatment well    Behavior During Therapy [facility] for tasks assessed/performed             Past Medical History:  Diagnosis Date   Arthritis    Cancer ([place]) 10/2012   colon   Constipation    Diabetes mellitus without complication ([place])    Gout    Hyperlipidemia    Hypertension    Insomnia     Past Surgical History:  Procedure Laterality Date   CARPAL TUNNEL RELEASE     right   COLON RESECTION  12/11/2012   Procedure: LAPAROSCOPIC RIGHT COLON RESECTION;  Surgeon: Leighton Ruff, MD;  [facility];  Service: General;  Laterality: Right;   COLON SURGERY  12/11/2012   LIVER ULTRASOUND  12/11/2012   Procedure: LIVER ULTRASOUND;  Surgeon: Leighton Ruff, MD;  [facility];  Service: General;  Laterality: Right;  Intraoperative Liver Ultrasound    There were no vitals filed for this visit.   Subjective Assessment - 09/01/21 1135     Subjective Pt reports having no pain today, adding that he has been doing his exercises regularly with no difficulty.    Currently in Pain? No/denies    Pain Score 0-No pain                               [facility] Adult PT Treatment/Exercise - 09/01/21 0001       Lumbar Exercises: Stretches   Lower Trunk Rotation 5 reps;20 seconds    Press Ups 3 reps;30 seconds      Lumbar Exercises: Standing   Other Standing  Lumbar Exercises Trunk rotation with 10# cable 2x8 BIL    Other Standing Lumbar Exercises Dead lift with bar and 30# 3x8      Lumbar Exercises: Supine   Large Ball Abdominal Isometric Limitations 3x30sec with physioball    Other Supine Lumbar Exercises curl-up with alternating heel taps 2x10 BIL      Knee/Hip Exercises: Machines for Strengthening   Hip Cybex hip abduction and extension at 42.5# 2x10 BIL each                     PT Education - 09/01/21 1157     Education Details Updated HEP    Person(s) Educated Patient    Methods Explanation;Demonstration;Handout    Comprehension Verbalized understanding;Returned demonstration                 PT Long Term Goals - 08/18/21 1243       PT LONG TERM GOAL #1   Title pt will be able to perform HEP independently for long term pain mgmt    Time 6    Period Weeks    Status New  Target Date 09/29/21      PT LONG TERM GOAL #2   Title FOTO score will improve to 60% or more in order to show increased mobility    Baseline 48%    Time 6    Period Weeks    Status New    Target Date 09/29/21      PT LONG TERM GOAL #3   Title Pt will increase ability to stand for ADLs without increasing back or leg pain for up to 20 min    Baseline can be 7/10+    Time 6    Period Weeks    Status New    Target Date 09/29/21      PT LONG TERM GOAL #4   Title Pt will be able to increase strength in hips to 4+/5 in extension and abduction without increasing back pain in prder to tolerate community ambulation.    Time 6    Period Weeks    Status New    Target Date 09/29/21                   Plan - 09/01/21 1159     Clinical Impression Statement Pt responded well to all new interventions today, demonstrating proper form and no increase in pai with core and hip strengthening exercises. He will continue to benefit from skilled PT to address his primary impairments and return to his prior level of function without  limitation.    Personal Factors and Comorbidities Comorbidity 3+;Past/Current Experience;Time since onset of injury/illness/exacerbation    Comorbidities knee OA, diabetes, HTN, chronic pain and disability    Examination-Activity Limitations Carry;Lift;Sit;Stand;Sleep;Locomotion Level;Bed Mobility;Bend;Squat;Transfers;Stairs    Examination-Participation Restrictions Community Activity;Shop;Interpersonal Relationship;Yard Work;Meal Prep    Stability/Clinical Decision Making Evolving/Moderate complexity    Clinical Decision Making Moderate    Rehab Potential Excellent    PT Frequency 2x / week    PT Duration 6 weeks    PT Treatment/Interventions ADLs/Self Care Home Management;Electrical Stimulation;Cryotherapy;Moist Heat;Traction;Functional mobility training;Therapeutic exercise;Therapeutic activities;Balance training;Patient/family education;Manual techniques;Passive range of motion;Dry needling;Spinal Manipulations    PT Next Visit Plan Progress core/ hip strengthening as tolerated    PT Home Exercise Plan Access Code: [email or web address]    Consulted and Agree with Plan of Care Patient             Patient will benefit from skilled therapeutic intervention in order to improve the following deficits and impairments:  Abnormal gait, Decreased activity tolerance, Decreased endurance, Decreased range of motion, Decreased strength, Hypomobility, Increased fascial restricitons, Pain, Postural dysfunction, Impaired flexibility, Difficulty walking, Decreased mobility  Visit Diagnosis: Chronic bilateral low back pain with bilateral sciatica  Joint stiffness of spine  Muscle weakness (generalized)  Difficulty in walking, not elsewhere classified     Problem List Patient Active Problem List   Diagnosis Date Noted   Colon cancer ([place]) 10/21/2012   HYPERLIPIDEMIA 12/04/2006   LYMPHADENOPATHY 12/04/2006   CARPAL TUNNEL RELEASE, RIGHT, HX OF 12/04/2006   GOUT 11/23/2006   ALCOHOL ABUSE 11/23/2006    TOBACCO ABUSE 11/23/2006   HYPERTENSION 11/23/2006    Gary Frederick Line, PT, DPT 09/01/21 12:10 PM   [facility] [address] [place], [place], [address] Phone: [phone]   Fax:  [phone]  Name: JEREME LOREN MRN: [id number] Date of Birth: [birth year]

## 2021-09-04 ENCOUNTER — Ambulatory Visit: Payer: Medicare Other | Attending: Neurosurgery | Admitting: Physical Therapy

## 2021-09-04 ENCOUNTER — Other Ambulatory Visit: Payer: Self-pay

## 2021-09-04 DIAGNOSIS — M6281 Muscle weakness (generalized): Secondary | ICD-10-CM | POA: Diagnosis not present

## 2021-09-04 DIAGNOSIS — M5441 Lumbago with sciatica, right side: Secondary | ICD-10-CM | POA: Insufficient documentation

## 2021-09-04 DIAGNOSIS — R262 Difficulty in walking, not elsewhere classified: Secondary | ICD-10-CM | POA: Insufficient documentation

## 2021-09-04 DIAGNOSIS — M5442 Lumbago with sciatica, left side: Secondary | ICD-10-CM | POA: Insufficient documentation

## 2021-09-04 DIAGNOSIS — G8929 Other chronic pain: Secondary | ICD-10-CM | POA: Diagnosis not present

## 2021-09-04 DIAGNOSIS — M2560 Stiffness of unspecified joint, not elsewhere classified: Secondary | ICD-10-CM | POA: Diagnosis not present

## 2021-09-04 NOTE — Therapy (Signed)
[place], [place], [address] Phone: [phone]   Fax:  [phone]  Physical Therapy Treatment  Patient Details  Name: Gary Frederick MRN: [id number] Date of Birth: 05-14-1956 Referring Provider (PT): Consuella Lose MD   Encounter Date: 09/04/2021   PT End of Session - 09/04/21 1122     Visit Number 4    Number of Visits 12    Date for PT Re-Evaluation 09/29/21    Authorization Type UHC MCR and MCD    PT Start Time 1120    PT Stop Time 3976    PT Time Calculation (min) 32 min             Past Medical History:  Diagnosis Date   Arthritis    Cancer ([place]) 10/2012   colon   Constipation    Diabetes mellitus without complication ([place])    Gout    Hyperlipidemia    Hypertension    Insomnia     Past Surgical History:  Procedure Laterality Date   CARPAL TUNNEL RELEASE     right   COLON RESECTION  12/11/2012   Procedure: LAPAROSCOPIC RIGHT COLON RESECTION;  Surgeon: Leighton Ruff, MD;  Location: [facility];  Service: General;  Laterality: Right;   COLON SURGERY  12/11/2012   LIVER ULTRASOUND  12/11/2012   Procedure: LIVER ULTRASOUND;  Surgeon: Leighton Ruff, MD;  Location: [facility];  Service: General;  Laterality: Right;  Intraoperative Liver Ultrasound    There were no vitals filed for this visit.   Subjective Assessment - 09/04/21 1119     Subjective The weight was too much on that machine my back was hurting after that.  Pt arrives 15 min late.    Currently in Pain? Yes    Pain Score 3     Pain Location Back    Pain Orientation Lower;Right    Pain Descriptors / Indicators Aching;Tightness    Pain Type Chronic pain    Pain Onset More than a month ago    Pain Frequency Intermittent    Aggravating Factors  lifting the other day    Pain Relieving Factors meds, massage                Skilled therapy interventions:   Therapeutic Exercise: Supine stretching  LTR knees  together , then knee over knee x 5   Single knee to chest then rotation for SI gapping x 2 on Rt side    Done on L side as well   Bridging x 5, partial. X 10 heels on bolster for increased glute and hamstring  Seated hamstring x 2 , 30 sec  Wall squat x 10 , holding 10 lbs, 2 sets, knees wide for knee pain  Standing Palloff press x black band x 15   Tandem Palloff press small rotation x 10 each side   Brief manual to Rt low lumbar in sidelying , decompression , caudal pressure to Rt iliac criest, Quadratus lumborum Prone hip PROM and Gr II lumbar         PT Long Term Goals - 08/18/21 1243       PT LONG TERM GOAL #1   Title pt will be able to perform HEP independently for long term pain mgmt    Time 6    Period Weeks    Status New    Target Date 09/29/21      PT LONG TERM GOAL #2   Title FOTO score will  improve to 60% or more in order to show increased mobility    Baseline 48%    Time 6    Period Weeks    Status New    Target Date 09/29/21      PT LONG TERM GOAL #3   Title Pt will increase ability to stand for ADLs without increasing back or leg pain for up to 20 min    Baseline can be 7/10+    Time 6    Period Weeks    Status New    Target Date 09/29/21      PT LONG TERM GOAL #4   Title Pt will be able to increase strength in hips to 4+/5 in extension and abduction without increasing back pain in prder to tolerate community ambulation.    Time 6    Period Weeks    Status New    Target Date 09/29/21                   Plan - 09/04/21 1131     Clinical Impression Statement Pt with slight increase in pain over the past few days.  Did not do the treadmill as planned due to the pain . Stretched and pain resolved almost completely.  Modified wall squats for knee pain." I feel so much better now".    PT Treatment/Interventions ADLs/Self Care Home Management;Electrical Stimulation;Cryotherapy;Moist Heat;Traction;Functional mobility training;Therapeutic  exercise;Therapeutic activities;Balance training;Patient/family education;Manual techniques;Passive range of motion;Dry needling;Spinal Manipulations    PT Next Visit Plan Progress core/ hip strengthening as tolerated    PT Home Exercise Plan Access Code: [email or web address]    Consulted and Agree with Plan of Care Patient             Patient will benefit from skilled therapeutic intervention in order to improve the following deficits and impairments:  Abnormal gait, Decreased activity tolerance, Decreased endurance, Decreased range of motion, Decreased strength, Hypomobility, Increased fascial restricitons, Pain, Postural dysfunction, Impaired flexibility, Difficulty walking, Decreased mobility  Visit Diagnosis: Chronic bilateral low back pain with bilateral sciatica  Joint stiffness of spine  Muscle weakness (generalized)  Difficulty in walking, not elsewhere classified     Problem List Patient Active Problem List   Diagnosis Date Noted   Colon cancer ([place]) 10/21/2012   HYPERLIPIDEMIA 12/04/2006   LYMPHADENOPATHY 12/04/2006   CARPAL TUNNEL RELEASE, RIGHT, HX OF 12/04/2006   GOUT 11/23/2006   ALCOHOL ABUSE 11/23/2006   TOBACCO ABUSE 11/23/2006   HYPERTENSION 11/23/2006    Thresia Ramanathan, PT 09/04/2021, 11:52 AM  [facility] [address] [place], [place], [address] Phone: [phone]   Fax:  [phone]  Name: Gary Frederick MRN: [id number] Date of Birth: 1956-11-09   Raeford Razor, PT 09/04/21 11:52 AM Phone: [phone] Fax: [phone]

## 2021-09-06 ENCOUNTER — Other Ambulatory Visit: Payer: Self-pay

## 2021-09-06 ENCOUNTER — Ambulatory Visit: Payer: Medicare Other | Admitting: Physical Therapy

## 2021-09-06 ENCOUNTER — Encounter: Payer: Self-pay | Admitting: Physical Therapy

## 2021-09-06 DIAGNOSIS — R262 Difficulty in walking, not elsewhere classified: Secondary | ICD-10-CM

## 2021-09-06 DIAGNOSIS — M2560 Stiffness of unspecified joint, not elsewhere classified: Secondary | ICD-10-CM

## 2021-09-06 DIAGNOSIS — G8929 Other chronic pain: Secondary | ICD-10-CM

## 2021-09-06 DIAGNOSIS — M5442 Lumbago with sciatica, left side: Secondary | ICD-10-CM

## 2021-09-06 DIAGNOSIS — M6281 Muscle weakness (generalized): Secondary | ICD-10-CM | POA: Diagnosis not present

## 2021-09-06 DIAGNOSIS — M5441 Lumbago with sciatica, right side: Secondary | ICD-10-CM | POA: Diagnosis not present

## 2021-09-06 NOTE — Therapy (Signed)
[place], [place], [address] Phone: [phone]   Fax:  [phone]  Physical Therapy Treatment  Patient Details  Name: Gary Frederick MRN: [id number] Date of Birth: 21-Aug-1956 Referring Provider (PT): Consuella Lose MD   Encounter Date: 09/06/2021   PT End of Session - 09/06/21 1110     Visit Number 5    Number of Visits 12    Date for PT Re-Evaluation 09/29/21    Authorization Type UHC MCR and MCD    PT Start Time 5732    PT Stop Time 2025    PT Time Calculation (min) 43 min             Past Medical History:  Diagnosis Date   Arthritis    Cancer ([place]) 10/2012   colon   Constipation    Diabetes mellitus without complication ([place])    Gout    Hyperlipidemia    Hypertension    Insomnia     Past Surgical History:  Procedure Laterality Date   CARPAL TUNNEL RELEASE     right   COLON RESECTION  12/11/2012   Procedure: LAPAROSCOPIC RIGHT COLON RESECTION;  Surgeon: Leighton Ruff, MD;  [facility];  Service: General;  Laterality: Right;   COLON SURGERY  12/11/2012   LIVER ULTRASOUND  12/11/2012   Procedure: LIVER ULTRASOUND;  Surgeon: Leighton Ruff, MD;  [facility];  Service: General;  Laterality: Right;  Intraoperative Liver Ultrasound    There were no vitals filed for this visit.   Subjective Assessment - 09/06/21 1108     Subjective Pt only with a little pain Rt side of low back.   Has a chest CT next week so he needs to cancel Wed.    Currently in Pain? Yes    Pain Score 2     Pain Location Back    Pain Orientation Right;Lower    Pain Descriptors / Indicators Aching    Pain Type Chronic pain    Pain Onset More than a month ago    Pain Frequency Intermittent    Aggravating Factors  standing, walking    Pain Relieving Factors meds, stretching, massage                Skilled therapy interventions:   Therapeutic Exercise: TM up to 2.0 mph and 2 % grade  for 8 min  Leg press 1 plate x 10, 3 plates x 15, cues for control Squat edge of table with dowel x 10 Hip hinging with multiple cues and strategies, used 15 lbs KB (wall, dowel, step) Supine lower trunk rotation x 10  Supine core press diagonal with opp arm opp leg x 5  Ball bridges x 10 knee slightly bent, then x 10 knee ext . Fully on ball  Hamstring curl x 10 Hamstring stretch 2 x 30 sec   Brief LAD on Rt LE x 4       PT Education - 09/06/21 1114     Education Details lifting and hip hinge, body mechanics    Person(s) Educated Patient    Methods Explanation    Comprehension Verbalized understanding;Returned demonstration                 PT Long Term Goals - 08/18/21 1243       PT LONG TERM GOAL #1   Title pt will be able to perform HEP independently for long term pain mgmt    Time 6  Period Weeks    Status New    Target Date 09/29/21      PT LONG TERM GOAL #2   Title FOTO score will improve to 60% or more in order to show increased mobility    Baseline 48%    Time 6    Period Weeks    Status New    Target Date 09/29/21      PT LONG TERM GOAL #3   Title Pt will increase ability to stand for ADLs without increasing back or leg pain for up to 20 min    Baseline can be 7/10+    Time 6    Period Weeks    Status New    Target Date 09/29/21      PT LONG TERM GOAL #4   Title Pt will be able to increase strength in hips to 4+/5 in extension and abduction without increasing back pain in prder to tolerate community ambulation.    Time 6    Period Weeks    Status New    Target Date 09/29/21                    Patient will benefit from skilled therapeutic intervention in order to improve the following deficits and impairments:     Visit Diagnosis: Chronic bilateral low back pain with bilateral sciatica  Joint stiffness of spine  Muscle weakness (generalized)  Difficulty in walking, not elsewhere classified     Problem List Patient  Active Problem List   Diagnosis Date Noted   Colon cancer ([place]) 10/21/2012   HYPERLIPIDEMIA 12/04/2006   LYMPHADENOPATHY 12/04/2006   CARPAL TUNNEL RELEASE, RIGHT, HX OF 12/04/2006   GOUT 11/23/2006   ALCOHOL ABUSE 11/23/2006   TOBACCO ABUSE 11/23/2006   HYPERTENSION 11/23/2006    Pocahontas Cohenour, PT 09/06/2021, 11:18 AM  [facility] [address] [place], [place], [address] Phone: [phone]   Fax:  [phone]  Name: Gary Frederick MRN: [id number] Date of Birth: [birth year]   Raeford Razor, PT 09/06/21 11:58 AM Phone: [phone] Fax: [phone]

## 2021-09-11 ENCOUNTER — Encounter: Payer: Self-pay | Admitting: Physical Therapy

## 2021-09-11 ENCOUNTER — Other Ambulatory Visit: Payer: Self-pay

## 2021-09-11 ENCOUNTER — Ambulatory Visit: Payer: Medicare Other | Admitting: Physical Therapy

## 2021-09-11 DIAGNOSIS — M6281 Muscle weakness (generalized): Secondary | ICD-10-CM

## 2021-09-11 DIAGNOSIS — G8929 Other chronic pain: Secondary | ICD-10-CM

## 2021-09-11 DIAGNOSIS — M2560 Stiffness of unspecified joint, not elsewhere classified: Secondary | ICD-10-CM

## 2021-09-11 DIAGNOSIS — M5442 Lumbago with sciatica, left side: Secondary | ICD-10-CM | POA: Diagnosis not present

## 2021-09-11 DIAGNOSIS — M5441 Lumbago with sciatica, right side: Secondary | ICD-10-CM | POA: Diagnosis not present

## 2021-09-11 DIAGNOSIS — R262 Difficulty in walking, not elsewhere classified: Secondary | ICD-10-CM

## 2021-09-11 NOTE — Therapy (Signed)
[place], [place], [address] Phone: [phone]   Fax:  [phone]  Physical Therapy Treatment  Patient Details  Name: Gary Frederick MRN: [id number] Date of Birth: Jul 03, 1956 Referring Provider (PT): Consuella Lose MD   Encounter Date: 09/11/2021   PT End of Session - 09/11/21 1112     Visit Number 6    Number of Visits 12    Date for PT Re-Evaluation 09/29/21    Authorization Type UHC MCR and MCD    PT Start Time 1104    PT Stop Time 1149    PT Time Calculation (min) 45 min    Activity Tolerance Patient tolerated treatment well    Behavior During Therapy [facility] for tasks assessed/performed             Past Medical History:  Diagnosis Date   Arthritis    Cancer ([place]) 10/2012   colon   Constipation    Diabetes mellitus without complication ([place])    Gout    Hyperlipidemia    Hypertension    Insomnia     Past Surgical History:  Procedure Laterality Date   CARPAL TUNNEL RELEASE     right   COLON RESECTION  12/11/2012   Procedure: LAPAROSCOPIC RIGHT COLON RESECTION;  Surgeon: Leighton Ruff, MD;  [facility];  Service: General;  Laterality: Right;   COLON SURGERY  12/11/2012   LIVER ULTRASOUND  12/11/2012   Procedure: LIVER ULTRASOUND;  Surgeon: Leighton Ruff, MD;  [facility];  Service: General;  Laterality: Right;  Intraoperative Liver Ultrasound    There were no vitals filed for this visit.   Subjective Assessment - 09/11/21 1111     Subjective Had some pain over the weekednd, did some walking on the treadmill.    Currently in Pain? Yes    Pain Score 3     Pain Location Back    Pain Orientation Right;Lower    Pain Descriptors / Indicators Aching    Pain Type Chronic pain    Pain Onset More than a month ago    Pain Frequency Intermittent                Skilled therapy interventions:   Therapeutic Exercise:  Standing squats x 10  Step downs 8 inch  pain in L knee , able to do x 10 in Rt knee  Hip abduction x 15 in standing  Leg press 2 plates x 15 , 3 plates x 15  Quaduped rocking in neutral spine    UE lift x 8, alt. LE lift x 8 and combo x 8 (bird dog) MODIFIED TO STANDING/PLANK Hip hinging , painful in L knee and back.  Mod to max cues, 10 lbs , needs reinforcement   Supine mat:  Hamstring 2 x 45  sec each LE   Knee to chest and then opp shoulder x 2 x 30 sec each   Added rotation each side  x 2   Supine 90/90 hands under tailbone 30 sec isometric  Added knee and hip extension x 10   Bridging x 10   LTR knees wide for hip ER/IR x 10   Self care/Pt. Education:     HEP   technique, hip hinging       PT Long Term Goals - 09/11/21 1203       PT LONG TERM GOAL #1   Title pt will be able to perform HEP independently for long term pain  mgmt    Baseline up to date    Status On-going      PT LONG TERM GOAL #2   Title FOTO score will improve to 60% or more in order to show increased mobility    Status Unable to assess      PT LONG TERM GOAL #3   Title Pt will increase ability to stand for ADLs without increasing back or leg pain for up to 20 min    Baseline no more leg pain    Status Partially Met      PT LONG TERM GOAL #4   Title Pt will be able to increase strength in hips to 4+/5 in extension and abduction without increasing back pain in prder to tolerate community ambulation.    Status Unable to assess                   Plan - 09/11/21 1142     Clinical Impression Statement Patient continues to have min low back pain.  Pain was aggravated by hip hinging last week and cont to have some difficulty with technique today, so we changed to focus to neutral spine core on the mat .  He no longer complains of leg pain with standing, walking.  He will attend 2 more visits and likely be DC.  Forgot to issue FOTO for an update but will do at the next visit.    Examination-Activity Limitations  Carry;Lift;Sit;Stand;Sleep;Locomotion Level;Bed Mobility;Bend;Squat;Transfers;Stairs    PT Treatment/Interventions ADLs/Self Care Home Management;Electrical Stimulation;Cryotherapy;Moist Heat;Traction;Functional mobility training;Therapeutic exercise;Therapeutic activities;Balance training;Patient/family education;Manual techniques;Passive range of motion;Dry needling;Spinal Manipulations    PT Next Visit Plan FOTO? goals.  check MMT/  reinforce core and neutral in standing Progress core/ hip strengthening as tolerated    PT Home Exercise Plan Access Code: [email or web address]    Consulted and Agree with Plan of Care Patient             Patient will benefit from skilled therapeutic intervention in order to improve the following deficits and impairments:  Abnormal gait, Decreased activity tolerance, Decreased endurance, Decreased range of motion, Decreased strength, Hypomobility, Increased fascial restricitons, Pain, Postural dysfunction, Impaired flexibility, Difficulty walking, Decreased mobility  Visit Diagnosis: Chronic bilateral low back pain with bilateral sciatica  Joint stiffness of spine  Difficulty in walking, not elsewhere classified  Muscle weakness (generalized)     Problem List Patient Active Problem List   Diagnosis Date Noted   Colon cancer ([place]) 10/21/2012   HYPERLIPIDEMIA 12/04/2006   LYMPHADENOPATHY 12/04/2006   CARPAL TUNNEL RELEASE, RIGHT, HX OF 12/04/2006   GOUT 11/23/2006   ALCOHOL ABUSE 11/23/2006   TOBACCO ABUSE 11/23/2006   HYPERTENSION 11/23/2006    Kelsy Polack, PT 09/11/2021, 12:08 PM  [facility] [address] [place], [place], [address] Phone: [phone]   Fax:  [phone]  Name: Gary Frederick MRN: [id number] Date of Birth: 13-Jul-1956   Raeford Razor, PT 09/11/21 12:08 PM Phone: [phone] Fax: [phone]

## 2021-09-13 ENCOUNTER — Encounter: Payer: Medicare Other | Admitting: Physical Therapy

## 2021-09-13 ENCOUNTER — Ambulatory Visit
Admission: RE | Admit: 2021-09-13 | Discharge: 2021-09-13 | Disposition: A | Discharge status: Home / Self Care | Payer: Medicare Other | Source: Ambulatory Visit | Attending: Acute Care | Admitting: Acute Care

## 2021-09-13 DIAGNOSIS — Z87891 Personal history of nicotine dependence: Secondary | ICD-10-CM

## 2021-09-13 DIAGNOSIS — J439 Emphysema, unspecified: Secondary | ICD-10-CM | POA: Diagnosis not present

## 2021-09-13 DIAGNOSIS — R911 Solitary pulmonary nodule: Secondary | ICD-10-CM | POA: Diagnosis not present

## 2021-09-13 DIAGNOSIS — I700 Atherosclerosis of aorta: Secondary | ICD-10-CM | POA: Diagnosis not present

## 2021-09-13 DIAGNOSIS — F17210 Nicotine dependence, cigarettes, uncomplicated: Secondary | ICD-10-CM

## 2021-09-13 DIAGNOSIS — R918 Other nonspecific abnormal finding of lung field: Secondary | ICD-10-CM | POA: Diagnosis not present

## 2021-09-18 ENCOUNTER — Ambulatory Visit: Payer: Medicare Other | Admitting: Physical Therapy

## 2021-09-20 ENCOUNTER — Encounter: Payer: Self-pay | Admitting: Physical Therapy

## 2021-09-20 ENCOUNTER — Other Ambulatory Visit: Payer: Self-pay

## 2021-09-20 ENCOUNTER — Ambulatory Visit: Payer: Medicare Other | Admitting: Physical Therapy

## 2021-09-20 DIAGNOSIS — M5441 Lumbago with sciatica, right side: Secondary | ICD-10-CM | POA: Diagnosis not present

## 2021-09-20 DIAGNOSIS — M5442 Lumbago with sciatica, left side: Secondary | ICD-10-CM

## 2021-09-20 DIAGNOSIS — M2560 Stiffness of unspecified joint, not elsewhere classified: Secondary | ICD-10-CM | POA: Diagnosis not present

## 2021-09-20 DIAGNOSIS — G8929 Other chronic pain: Secondary | ICD-10-CM

## 2021-09-20 DIAGNOSIS — M6281 Muscle weakness (generalized): Secondary | ICD-10-CM

## 2021-09-20 DIAGNOSIS — R262 Difficulty in walking, not elsewhere classified: Secondary | ICD-10-CM | POA: Diagnosis not present

## 2021-09-20 NOTE — Therapy (Signed)
[place], [place], [address] Phone: [phone]   Fax:  [phone]  Physical Therapy Treatment  Patient Details  Name: KILIAN SCHWARTZ MRN: [id number] Date of Birth: [birth year] Referring Provider (PT): Consuella Lose MD   Encounter Date: 09/20/2021   PT End of Session - 09/20/21 1121     Visit Number 7    Number of Visits 12    Date for PT Re-Evaluation 09/29/21    Authorization Type UHC MCR and MCD    PT Start Time 1108    PT Stop Time 1146    PT Time Calculation (min) 38 min    Activity Tolerance Patient tolerated treatment well    Behavior During Therapy [facility] for tasks assessed/performed             Past Medical History:  Diagnosis Date   Arthritis    Cancer ([place]) 10/2012   colon   Constipation    Diabetes mellitus without complication ([place])    Gout    Hyperlipidemia    Hypertension    Insomnia     Past Surgical History:  Procedure Laterality Date   CARPAL TUNNEL RELEASE     right   COLON RESECTION  12/11/2012   Procedure: LAPAROSCOPIC RIGHT COLON RESECTION;  Surgeon: Leighton Ruff, MD;  [facility];  Service: General;  Laterality: Right;   COLON SURGERY  12/11/2012   LIVER ULTRASOUND  12/11/2012   Procedure: LIVER ULTRASOUND;  Surgeon: Leighton Ruff, MD;  [facility];  Service: General;  Laterality: Right;  Intraoperative Liver Ultrasound    There were no vitals filed for this visit.   Subjective Assessment - 09/20/21 1115     Subjective MIssed Monday due to Knee pain . Knee was swollen.  Stiff today. I never really had knee pain until I started taking Ozempic.    Currently in Pain? Yes    Pain Score 4     Pain Location Buttocks    Pain Orientation Right    Pain Descriptors / Indicators Aching    Pain Type Chronic pain    Pain Onset More than a month ago    Pain Frequency Intermittent    Aggravating Factors  not sure    Pain Relieving Factors meds,  stretching    Multiple Pain Sites Yes    Pain Score --   was 7/10 Monday   Pain Location Knee    Pain Descriptors / Indicators Aching    Pain Type Acute pain    Aggravating Factors  walking    Pain Relieving Factors rest, no meds                   Skilled therapy interventions:   Therapeutic Exercise: Nustep  for warm up and subjective intake, L5 UE and LE for 7 min , min increase in knee pain R  SLR x 5 , back pain, added a elbow prop and still painful . Post pelvic tilt improved pain briefly.  Hamstring , ITB Rt LE , 30 sec x 2 each  Rt ant hip stretch off table Lower trunk rotation with wide hips for Rt hip IR   Manual PROM Rt hip flex/external rotation, LAD Rt LE 3 x 30 sec with sl. Hip ER  Self care FOTO, goals, POC Anatomy see pt education                   PT Education - 09/20/21 1235  Education Details FOTO improvement, hip vs back pain , arthritis/spondylosis and referred pain    Person(s) Educated Patient    Methods Explanation    Comprehension Verbalized understanding                 PT Long Term Goals - 09/20/21 1137       PT LONG TERM GOAL #1   Title pt will be able to perform HEP independently for long term pain mgmt    Status On-going      PT LONG TERM GOAL #2   Title FOTO score will improve to 60% or more in order to show increased mobility    Baseline 50%    Status On-going      PT LONG TERM GOAL #3   Title Pt will increase ability to stand for ADLs without increasing back or leg pain for up to 20 min    Baseline met but this week, mild leg pain    Status Partially Met      PT LONG TERM GOAL #4   Title Pt will be able to increase strength in hips to 4+/5 in extension and abduction without increasing back pain in prder to tolerate community ambulation.    Status On-going                   Plan - 09/20/21 1129     Clinical Impression Statement Patient improving but with acute knee flare up this  week.  Cont to have stiffness but he beleives it is due to a medication side effect.  He has mild pain in Rt hip today, buttock which is likely referred from the lumbar spine. FOTO score improved only 2 % .  Will cont POC for 2 more weeks and reassess for further need for PT.  I do think he needs more work on lifting mechanics to be able to manage back pain with ADLs.    PT Treatment/Interventions ADLs/Self Care Home Management;Electrical Stimulation;Cryotherapy;Moist Heat;Traction;Functional mobility training;Therapeutic exercise;Therapeutic activities;Balance training;Patient/family education;Manual techniques;Passive range of motion;Dry needling;Spinal Manipulations    PT Next Visit Plan hip hinging with progressive loading    PT Home Exercise Plan Access Code: [email or web address]    Consulted and Agree with Plan of Care Patient             Patient will benefit from skilled therapeutic intervention in order to improve the following deficits and impairments:  Abnormal gait, Decreased activity tolerance, Decreased endurance, Decreased range of motion, Decreased strength, Hypomobility, Increased fascial restricitons, Pain, Postural dysfunction, Impaired flexibility, Difficulty walking, Decreased mobility  Visit Diagnosis: Chronic bilateral low back pain with bilateral sciatica  Joint stiffness of spine  Difficulty in walking, not elsewhere classified  Muscle weakness (generalized)     Problem List Patient Active Problem List   Diagnosis Date Noted   Colon cancer ([place]) 10/21/2012   HYPERLIPIDEMIA 12/04/2006   LYMPHADENOPATHY 12/04/2006   CARPAL TUNNEL RELEASE, RIGHT, HX OF 12/04/2006   GOUT 11/23/2006   ALCOHOL ABUSE 11/23/2006   TOBACCO ABUSE 11/23/2006   HYPERTENSION 11/23/2006    Joli Koob, PT 09/20/2021, 12:40 PM  [facility] [address] [place], [place], [address] Phone: [phone]   Fax:  [phone]  Name: JASYAH THEURER MRN: [id number] Date of Birth: 06/24/1956  Raeford Razor, PT 09/20/21 12:45 PM Phone: [phone] Fax: [phone]

## 2021-09-29 ENCOUNTER — Other Ambulatory Visit: Payer: Self-pay | Admitting: Acute Care

## 2021-09-29 DIAGNOSIS — Z87891 Personal history of nicotine dependence: Secondary | ICD-10-CM

## 2021-09-29 DIAGNOSIS — F17210 Nicotine dependence, cigarettes, uncomplicated: Secondary | ICD-10-CM

## 2021-10-04 ENCOUNTER — Other Ambulatory Visit: Payer: Self-pay

## 2021-10-04 ENCOUNTER — Ambulatory Visit: Payer: Medicare Other | Attending: Neurosurgery | Admitting: Physical Therapy

## 2021-10-04 ENCOUNTER — Encounter: Payer: Self-pay | Admitting: Physical Therapy

## 2021-10-04 DIAGNOSIS — G8929 Other chronic pain: Secondary | ICD-10-CM | POA: Insufficient documentation

## 2021-10-04 DIAGNOSIS — M5441 Lumbago with sciatica, right side: Secondary | ICD-10-CM | POA: Diagnosis not present

## 2021-10-04 DIAGNOSIS — M6281 Muscle weakness (generalized): Secondary | ICD-10-CM | POA: Insufficient documentation

## 2021-10-04 DIAGNOSIS — M2560 Stiffness of unspecified joint, not elsewhere classified: Secondary | ICD-10-CM | POA: Diagnosis not present

## 2021-10-04 DIAGNOSIS — R262 Difficulty in walking, not elsewhere classified: Secondary | ICD-10-CM | POA: Diagnosis not present

## 2021-10-04 DIAGNOSIS — M5442 Lumbago with sciatica, left side: Secondary | ICD-10-CM | POA: Diagnosis not present

## 2021-10-04 NOTE — Therapy (Addendum)
[place], [place], [address] Phone: [phone]   Fax:  [phone]  Physical Therapy Treatment/Discharge   Patient Details  Name: Gary Frederick MRN: [id number] Date of Birth: Mar 11, 1956 Referring Provider (PT): Consuella Lose MD   Encounter Date: 10/04/2021   PT End of Session - 10/04/21 1103     Visit Number 8    Number of Visits 12    PT Start Time 7035    PT Stop Time 0093    PT Time Calculation (min) 35 min    Activity Tolerance Patient tolerated treatment well    Behavior During Therapy [facility] for tasks assessed/performed             Past Medical History:  Diagnosis Date   Arthritis    Cancer ([place]) 10/2012   colon   Constipation    Diabetes mellitus without complication ([place])    Gout    Hyperlipidemia    Hypertension    Insomnia     Past Surgical History:  Procedure Laterality Date   CARPAL TUNNEL RELEASE     right   COLON RESECTION  12/11/2012   Procedure: LAPAROSCOPIC RIGHT COLON RESECTION;  Surgeon: Leighton Ruff, MD;  Location: [facility];  Service: General;  Laterality: Right;   COLON SURGERY  12/11/2012   LIVER ULTRASOUND  12/11/2012   Procedure: LIVER ULTRASOUND;  Surgeon: Leighton Ruff, MD;  Location: [facility];  Service: General;  Laterality: Right;  Intraoperative Liver Ultrasound    There were no vitals filed for this visit.   Subjective Assessment - 10/04/21 1102     Subjective Knee is popping and tight.  Was 5/10.  Back pain is intermittent , L side is better but Rt side is about the same.  Therapy has helped it.  I would like to get my knee looked at.                [facility] PT Assessment - 10/04/21 0001       Observation/Other Assessments   Focus on Therapeutic Outcomes (FOTO)  50%      Sensation   Light Touch Appears Intact      Strength   Right Hip Flexion 5/5    Right Hip Extension 3+/5    Right Hip ABduction 4/5    Left Hip Flexion 5/5    Left  Hip Extension 3+/5    Left Hip ABduction 4/5    Right Knee Flexion 5/5    Right Knee Extension 5/5    Left Knee Flexion 5/5    Left Knee Extension 5/5             Skilled therapy interventions:   Therapeutic Exercise:  Supine stretching for hips   Hamstring 30 sec x 2   ITB 30 sec x 2   Knee to chest /opp shoulder x 2    Added rotation x 2   Bridging x 10 with 10 sec hold   Lower trunk rotation x 10 knees apart   Manual Therapy: Brief compression to Rt lumbar paraspinals   Self care/Pt. Education:   goals, progress,  POC, DC       PT Long Term Goals - 10/04/21 1104       PT LONG TERM GOAL #1   Title pt will be able to perform HEP independently for long term pain mgmt    Status Achieved      PT LONG TERM GOAL #2   Title FOTO score  will improve to 60% or more in order to show increased mobility    Baseline 50%    Status Partially Met      PT LONG TERM GOAL #3   Title Pt will increase ability to stand for ADLs without increasing back or leg pain for up to 20 min    Baseline 15 min mild back pain    Status Partially Met      PT LONG TERM GOAL #4   Title Pt will be able to increase strength in hips to 4+/5 in extension and abduction without increasing back pain in prder to tolerate community ambulation.    Baseline 3+/5 to 4/5    Status Partially Met                   Plan - 10/04/21 1147     Clinical Impression Statement Patient is pleased with the progress of his back.  He has been able to mow and blow leaves witout increased lasting Rt sided pain .  Previous to PT he says this would have made him have severe pain for 2 days.  He now has acute on chronic Rt knee pain that is moderate to severe at times.  He was DC from PT, will seek MD advice on Rt knee and possibly return if needed.    PT Next Visit Plan NA    PT Home Exercise Plan Access Code: [email or web address]    Consulted and Agree with Plan of Care Patient             Patient will benefit from  skilled therapeutic intervention in order to improve the following deficits and impairments:  Abnormal gait, Decreased activity tolerance, Decreased endurance, Decreased range of motion, Decreased strength, Hypomobility, Increased fascial restricitons, Pain, Postural dysfunction, Impaired flexibility, Difficulty walking, Decreased mobility  Visit Diagnosis: Chronic bilateral low back pain with bilateral sciatica  Joint stiffness of spine  Muscle weakness (generalized)  Difficulty in walking, not elsewhere classified     Problem List Patient Active Problem List   Diagnosis Date Noted   Colon cancer ([place]) 10/21/2012   HYPERLIPIDEMIA 12/04/2006   LYMPHADENOPATHY 12/04/2006   CARPAL TUNNEL RELEASE, RIGHT, HX OF 12/04/2006   GOUT 11/23/2006   ALCOHOL ABUSE 11/23/2006   TOBACCO ABUSE 11/23/2006   HYPERTENSION 11/23/2006    Ennio Houp, PT 10/04/2021, 11:50 AM  [facility] [address] [place], [place], [address] Phone: [phone]   Fax:  [phone]  Name: Gary Frederick MRN: [id number] Date of Birth: [birth year]  Raeford Razor, PT 10/04/21 11:53 AM Phone: [phone] Fax: [phone]    PHYSICAL THERAPY DISCHARGE SUMMARY  Visits from Start of Care: 8  Current functional level related to goals / functional outcomes: See above    Remaining deficits: See above    Education / Equipment: HEP, posture, lifting    Patient agrees to discharge. Patient goals were partially met. Patient is being discharged due to being pleased with the current functional level.  Raeford Razor, PT 11/06/21 9:23 AM Phone: [phone] Fax: [phone]

## 2021-10-11 ENCOUNTER — Ambulatory Visit: Payer: Medicare Other | Admitting: Physical Therapy

## 2021-11-06 ENCOUNTER — Other Ambulatory Visit: Payer: Self-pay

## 2021-11-06 ENCOUNTER — Ambulatory Visit
Admission: RE | Admit: 2021-11-06 | Discharge: 2021-11-06 | Disposition: A | Discharge status: Home / Self Care | Payer: Medicare Other | Source: Ambulatory Visit | Attending: Family Medicine | Admitting: Family Medicine

## 2021-11-06 ENCOUNTER — Other Ambulatory Visit: Payer: Self-pay | Admitting: Family Medicine

## 2021-11-06 ENCOUNTER — Inpatient Hospital Stay: Payer: Medicare Other

## 2021-11-06 ENCOUNTER — Encounter: Payer: Self-pay | Admitting: Student

## 2021-11-06 ENCOUNTER — Ambulatory Visit: Payer: Medicare Other | Admitting: Student

## 2021-11-06 VITALS — BP 134/94 | HR 73 | Temp 97.8°F | Ht 75.0 in | Wt 255.0 lb

## 2021-11-06 DIAGNOSIS — I491 Atrial premature depolarization: Secondary | ICD-10-CM

## 2021-11-06 DIAGNOSIS — I471 Supraventricular tachycardia: Secondary | ICD-10-CM | POA: Diagnosis not present

## 2021-11-06 DIAGNOSIS — I10 Essential (primary) hypertension: Secondary | ICD-10-CM | POA: Diagnosis not present

## 2021-11-06 DIAGNOSIS — Z720 Tobacco use: Secondary | ICD-10-CM | POA: Diagnosis not present

## 2021-11-06 DIAGNOSIS — I4891 Unspecified atrial fibrillation: Secondary | ICD-10-CM | POA: Diagnosis not present

## 2021-11-06 DIAGNOSIS — M5459 Other low back pain: Secondary | ICD-10-CM | POA: Diagnosis not present

## 2021-11-06 DIAGNOSIS — M545 Low back pain, unspecified: Secondary | ICD-10-CM | POA: Diagnosis not present

## 2021-11-06 DIAGNOSIS — R002 Palpitations: Secondary | ICD-10-CM

## 2021-11-06 DIAGNOSIS — R600 Localized edema: Secondary | ICD-10-CM | POA: Diagnosis not present

## 2021-11-06 DIAGNOSIS — R9431 Abnormal electrocardiogram [ECG] [EKG]: Secondary | ICD-10-CM | POA: Diagnosis not present

## 2021-11-06 DIAGNOSIS — R1011 Right upper quadrant pain: Secondary | ICD-10-CM | POA: Diagnosis not present

## 2021-11-06 DIAGNOSIS — M7989 Other specified soft tissue disorders: Secondary | ICD-10-CM | POA: Diagnosis not present

## 2021-11-06 DIAGNOSIS — M25561 Pain in right knee: Secondary | ICD-10-CM

## 2021-11-06 DIAGNOSIS — M5416 Radiculopathy, lumbar region: Secondary | ICD-10-CM | POA: Diagnosis not present

## 2021-11-06 DIAGNOSIS — E1165 Type 2 diabetes mellitus with hyperglycemia: Secondary | ICD-10-CM | POA: Diagnosis not present

## 2021-11-06 NOTE — Progress Notes (Signed)
Primary Physician/Referring:  [name], MD  Patient ID: Gary Frederick, male    DOB: 01/12/1956, 65 y.o.   MRN: [id number]  Chief Complaint  Patient presents with   Atrial Flutter   New Patient (Initial Visit)   HPI:    Gary Frederick  is a 65 y.o. AA male with history of hypertension, hyperlipidemia, type 2 diabetes mellitus, active smoker (1 ppd), and history of colon caner s/p resection in 2014. Patient is referred to our office urgently by PCP for evaluation and management of atrial flutter.   Patient was seen in PCP office earlier today and noted to have irregular rhythm on auscultation, therefore PCPs office did EKG which noted atrial flutter with variable conduction at a rate of 106 bpm.  Given CHA2DS2-VASc score of 2 PCPs office started patient on Eliquis 5 mg p.o. twice daily and referred him to our office for urgent evaluation.  Patient reports occasional episodes of palpitations every 1 to 2 months lasting several seconds, otherwise is asymptomatic from a cardiac standpoint.  Denies chest pain, dyspnea, syncope, near syncope, dizziness.  Past Medical History:  Diagnosis Date   Arthritis    Cancer ([place]) 10/2012   colon   Constipation    Diabetes mellitus without complication ([place])    Gout    Hyperlipidemia    Hypertension    Insomnia    Past Surgical History:  Procedure Laterality Date   CARPAL TUNNEL RELEASE     right   COLON RESECTION  12/11/2012   Procedure: LAPAROSCOPIC RIGHT COLON RESECTION;  Surgeon: Leighton Ruff, MD;  Location: [facility];  Service: General;  Laterality: Right;   COLON SURGERY  12/11/2012   LIVER ULTRASOUND  12/11/2012   Procedure: LIVER ULTRASOUND;  Surgeon: Leighton Ruff, MD;  Location: [facility];  Service: General;  Laterality: Right;  Intraoperative Liver Ultrasound   Family History  Problem Relation Age of Onset   Diabetes Mother    Stroke Mother    Huntington's disease Father    Hypertension Brother     Social History    Tobacco Use   Smoking status: Every Day    Packs/day: 1.00    Years: 30.00    Pack years: 30.00    Types: Cigarettes   Smokeless tobacco: Never  Substance Use Topics   Alcohol use: Yes    Alcohol/week: 5.0 standard drinks    Types: 5 Cans of beer per week   Marital Status: Married   ROS  Review of Systems  Constitutional: Negative for malaise/fatigue and weight gain.  Cardiovascular:  Positive for palpitations (rare). Negative for chest pain, claudication, leg swelling, near-syncope, orthopnea, paroxysmal nocturnal dyspnea and syncope.  Respiratory:  Negative for shortness of breath.   Neurological:  Negative for dizziness.   Objective  Blood pressure (!) 134/94, pulse 73, temperature 97.8 F (36.6 C), temperature source Temporal, height 6\' 3"  (1.905 m), weight 255 lb (115.7 kg), SpO2 96 %.  Vitals with BMI 11/06/2021 11/06/2021 02/02/2021  Height - 6\' 3"  6\' 3"   Weight - 255 lbs 252 lbs 10 oz  BMI - 62.37 62.83  Systolic 151 761 607  Diastolic 94 98 95  Pulse 73 72 77      Physical Exam Vitals reviewed.  HENT:     Head: Normocephalic and atraumatic.  Cardiovascular:     Rate and Rhythm: Normal rate and regular rhythm.     Pulses: Intact distal pulses.     Heart sounds: S1 normal and S2 normal.  No murmur heard.   No gallop.  Pulmonary:     Effort: Pulmonary effort is normal. No respiratory distress.     Breath sounds: No wheezing, rhonchi or rales.  Musculoskeletal:     Right lower leg: No edema.     Left lower leg: No edema.  Neurological:     Mental Status: He is alert.    Laboratory examination:   No results for input(s): NA, K, CL, CO2, GLUCOSE, BUN, CREATININE, CALCIUM, GFRNONAA, GFRAA in the last 8760 hours. CrCl cannot be calculated (Patient's most recent lab result is older than the maximum 21 days allowed.).  CMP Latest Ref Rng & Units 02/19/2020 03/25/2013 12/14/2012  Glucose 70 - 99 mg/dL 161(H) - 110(H)  BUN 8 - 23 mg/dL 5(L) - 7  Creatinine 0.61 -  1.24 mg/dL 0.82 0.91 0.78  Sodium 135 - 145 mmol/L 137 - 137  Potassium 3.5 - 5.1 mmol/L 3.9 - 3.4(L)  Chloride 98 - 111 mmol/L 101 - 99  CO2 22 - 32 mmol/L 25 - 29  Calcium 8.9 - 10.3 mg/dL 9.0 - 8.9   CBC Latest Ref Rng & Units 02/19/2020 12/14/2012 12/13/2012  WBC 4.0 - 10.5 K/uL 7.8 8.9 9.0  Hemoglobin 13.0 - 17.0 g/dL 16.5 12.6(L) 13.4  Hematocrit 39.0 - 52.0 % 48.2 37.1(L) 39.2  Platelets 150 - 400 K/uL 214 186 165    Lipid Panel No results for input(s): CHOL, TRIG, LDLCALC, VLDL, HDL, CHOLHDL, LDLDIRECT in the last 8760 hours.  HEMOGLOBIN A1C No results found for: HGBA1C, MPG TSH No results for input(s): TSH in the last 8760 hours.  External labs:  None  Allergies  No Known Allergies   Medications Prior to Visit:   Outpatient Medications Prior to Visit  Medication Sig Dispense Refill   acetaminophen (TYLENOL) 500 MG tablet Take 500 mg by mouth every 6 (six) hours as needed.     allopurinol (ZYLOPRIM) 100 MG tablet Take 100 mg by mouth daily.     amLODipine (NORVASC) 10 MG tablet Take 10 mg by mouth daily with lunch.      apixaban (ELIQUIS) 5 MG TABS tablet Take 5 mg by mouth 2 (two) times daily.     aspirin EC 81 MG tablet Take 81 mg by mouth daily.     lisinopril (PRINIVIL,ZESTRIL) 40 MG tablet Take 40 mg by mouth daily with lunch.      metoprolol succinate (TOPROL-XL) 50 MG 24 hr tablet Take 50 mg by mouth daily.      omega-3 acid ethyl esters (LOVAZA) 1 G capsule Take 2 g by mouth daily.      ONE TOUCH ULTRA TEST test strip USE 1 STRIP TO CHECK GLUCOSE TWICE DAILY     OZEMPIC, 0.25 OR 0.5 MG/DOSE, 2 MG/1.5ML SOPN SMARTSIG:0.25 Milligram(s) SUB-Q Once a Week     No facility-administered medications prior to visit.   Final Medications at End of Visit    Current Meds  Medication Sig   acetaminophen (TYLENOL) 500 MG tablet Take 500 mg by mouth every 6 (six) hours as needed.   allopurinol (ZYLOPRIM) 100 MG tablet Take 100 mg by mouth daily.   amLODipine (NORVASC)  10 MG tablet Take 10 mg by mouth daily with lunch.    apixaban (ELIQUIS) 5 MG TABS tablet Take 5 mg by mouth 2 (two) times daily.   aspirin EC 81 MG tablet Take 81 mg by mouth daily.   lisinopril (PRINIVIL,ZESTRIL) 40 MG tablet Take 40 mg by mouth daily  with lunch.    metoprolol succinate (TOPROL-XL) 50 MG 24 hr tablet Take 50 mg by mouth daily.    omega-3 acid ethyl esters (LOVAZA) 1 G capsule Take 2 g by mouth daily.    ONE TOUCH ULTRA TEST test strip USE 1 STRIP TO CHECK GLUCOSE TWICE DAILY   OZEMPIC, 0.25 OR 0.5 MG/DOSE, 2 MG/1.5ML SOPN SMARTSIG:0.25 Milligram(s) SUB-Q Once a Week   Radiology:   No results found.  Cardiac Studies:   None  EKG:   External EKG 11/06/2021: Atrial flutter with variable conduction at a rate of 106 bpm.  11/06/2021: Sinus rhythm with single PAC at a rate of 74 bpm.  Left axis, left anterior fascicular block.  Incomplete right bundle branch block.  No evidence of ischemia or underlying injury pattern.  Assessment     ICD-10-CM   1. SVT (supraventricular tachycardia) (HCC)  I47.1 EKG 12-Lead    2. Palpitations  R00.2 LONG TERM MONITOR (3-14 DAYS)    3. PAC (premature atrial contraction)  I49.1     4. Abnormal finding on EKG  R94.31 PCV ECHOCARDIOGRAM COMPLETE    PCV MYOCARDIAL PERFUSION WO LEXISCAN       There are no discontinued medications.  No orders of the defined types were placed in this encounter.  This patients CHA2DS2-VASc Score 2 (HTN, DM) and yearly risk of stroke 2.2%  Above score calculated as 1 point each if present [CHF, HTN, DM, Vascular=MI/PAD/Aortic Plaque, Age if 65-74, or Male] Above score calculated as 2 points each if present [Age > 75, or Stroke/TIA/TE] Score of 1=0.6; 2=2.2; 3=3.2; 4=4.8; 5=7.2; 6=9.8; 7=>9.8  Recommendations:   STEVE YOUNGBERG is a 65 y.o. AA male with history of hypertension, hyperlipidemia, type 2 diabetes mellitus, active smoker (1 ppd), and history of colon caner s/p resection in 2014.  Patient is referred to our office urgently by PCP for evaluation and management of atrial flutter.   Patient was seen in PCP office earlier today and noted to have irregular rhythm on auscultation, therefore PCPs office did EKG which noted atrial flutter with variable conduction at a rate of 106 bpm.  Given CHA2DS2-VASc score of 2 PCPs office started patient on Eliquis 5 mg p.o. twice daily and referred him to our office for urgent evaluation.  Reviewed external EKG which notes atrial flutter, however EKG in our office at this time is sinus rhythm with single PAC.  Discussed at length with patient pathophysiology and treatment recommendations for atrial flutter.  Reviewed risks versus benefits and alternatives to oral anticoagulation, patient verbalized understanding.  Given CHA2DS2-VASc score of 2 we will continue Eliquis for now, however will obtain 2-week monitor to evaluate further for atrial flutter as patient's episode may have been brief at which point he may not need long-term oral anticoagulation.  We will reevaluate Eliquis following cardiac monitor.  Patient has multiple cardiovascular risk factors and given PACs as well as concern for a flutter recommend further cardiovascular evaluation.  EKG also demonstrates abnormal conduction with incomplete right bundle branch block and left anterior fascicular block.  We will obtain echocardiogram as well as nuclear stress test.  Patient's blood pressure is presently well controlled.  Further recommendations pending results of cardiac work-up.  During this visit I reviewed and updated: Tobacco history  allergies medication reconciliation  medical history  surgical history  family history  social history.  This note was created using a voice recognition software as a result there may be grammatical errors inadvertently enclosed that  do not reflect the nature of this encounter. Every attempt is made to correct such errors.   Alethia Berthold,  PA-C 11/07/2021, 12:51 PM Office: [phone]

## 2021-11-07 ENCOUNTER — Encounter: Payer: Self-pay | Admitting: Student

## 2021-11-14 DIAGNOSIS — R002 Palpitations: Secondary | ICD-10-CM | POA: Diagnosis not present

## 2021-11-17 DIAGNOSIS — R002 Palpitations: Secondary | ICD-10-CM | POA: Diagnosis not present

## 2021-11-22 ENCOUNTER — Other Ambulatory Visit: Payer: Self-pay

## 2021-11-22 ENCOUNTER — Ambulatory Visit: Payer: Medicare Other

## 2021-11-22 DIAGNOSIS — R9431 Abnormal electrocardiogram [ECG] [EKG]: Secondary | ICD-10-CM | POA: Diagnosis not present

## 2021-11-24 NOTE — Progress Notes (Signed)
Stress test was low risk. Will discuss further at next office visit

## 2021-11-28 NOTE — Progress Notes (Signed)
Pt called, results reviewed. Pt voiced understanding.

## 2021-11-29 ENCOUNTER — Other Ambulatory Visit: Payer: Medicare Other

## 2021-11-29 DIAGNOSIS — M1711 Unilateral primary osteoarthritis, right knee: Secondary | ICD-10-CM | POA: Diagnosis not present

## 2021-11-29 DIAGNOSIS — I471 Supraventricular tachycardia: Secondary | ICD-10-CM | POA: Diagnosis not present

## 2021-11-29 DIAGNOSIS — I491 Atrial premature depolarization: Secondary | ICD-10-CM | POA: Diagnosis not present

## 2021-11-29 DIAGNOSIS — R002 Palpitations: Secondary | ICD-10-CM | POA: Diagnosis not present

## 2021-11-29 DIAGNOSIS — M25561 Pain in right knee: Secondary | ICD-10-CM | POA: Diagnosis not present

## 2021-11-29 DIAGNOSIS — I10 Essential (primary) hypertension: Secondary | ICD-10-CM | POA: Diagnosis not present

## 2021-11-29 DIAGNOSIS — E1169 Type 2 diabetes mellitus with other specified complication: Secondary | ICD-10-CM | POA: Diagnosis not present

## 2021-12-01 DIAGNOSIS — E7849 Other hyperlipidemia: Secondary | ICD-10-CM | POA: Diagnosis not present

## 2021-12-01 DIAGNOSIS — I10 Essential (primary) hypertension: Secondary | ICD-10-CM | POA: Diagnosis not present

## 2021-12-01 DIAGNOSIS — E118 Type 2 diabetes mellitus with unspecified complications: Secondary | ICD-10-CM | POA: Diagnosis not present

## 2021-12-18 ENCOUNTER — Ambulatory Visit: Payer: Medicare Other | Admitting: Student

## 2021-12-18 DIAGNOSIS — M1712 Unilateral primary osteoarthritis, left knee: Secondary | ICD-10-CM | POA: Diagnosis not present

## 2021-12-18 DIAGNOSIS — S83241A Other tear of medial meniscus, current injury, right knee, initial encounter: Secondary | ICD-10-CM | POA: Diagnosis not present

## 2021-12-19 ENCOUNTER — Ambulatory Visit: Payer: Medicare Other | Admitting: Student

## 2021-12-26 DIAGNOSIS — M25561 Pain in right knee: Secondary | ICD-10-CM | POA: Diagnosis not present

## 2021-12-26 DIAGNOSIS — I471 Supraventricular tachycardia: Secondary | ICD-10-CM | POA: Diagnosis not present

## 2021-12-26 DIAGNOSIS — R002 Palpitations: Secondary | ICD-10-CM | POA: Diagnosis not present

## 2021-12-26 DIAGNOSIS — E1169 Type 2 diabetes mellitus with other specified complication: Secondary | ICD-10-CM | POA: Diagnosis not present

## 2022-01-02 DIAGNOSIS — M25561 Pain in right knee: Secondary | ICD-10-CM | POA: Diagnosis not present

## 2022-01-05 DIAGNOSIS — M25561 Pain in right knee: Secondary | ICD-10-CM | POA: Diagnosis not present

## 2022-01-25 DIAGNOSIS — M6751 Plica syndrome, right knee: Secondary | ICD-10-CM | POA: Diagnosis not present

## 2022-01-25 DIAGNOSIS — S83281A Other tear of lateral meniscus, current injury, right knee, initial encounter: Secondary | ICD-10-CM | POA: Diagnosis not present

## 2022-01-25 DIAGNOSIS — G8918 Other acute postprocedural pain: Secondary | ICD-10-CM | POA: Diagnosis not present

## 2022-01-25 DIAGNOSIS — S83241A Other tear of medial meniscus, current injury, right knee, initial encounter: Secondary | ICD-10-CM | POA: Diagnosis not present

## 2022-01-25 DIAGNOSIS — S83231A Complex tear of medial meniscus, current injury, right knee, initial encounter: Secondary | ICD-10-CM | POA: Diagnosis not present

## 2022-01-25 DIAGNOSIS — M2341 Loose body in knee, right knee: Secondary | ICD-10-CM | POA: Diagnosis not present

## 2022-01-25 DIAGNOSIS — S83271A Complex tear of lateral meniscus, current injury, right knee, initial encounter: Secondary | ICD-10-CM | POA: Diagnosis not present

## 2022-01-25 DIAGNOSIS — M94261 Chondromalacia, right knee: Secondary | ICD-10-CM | POA: Diagnosis not present

## 2022-02-05 DIAGNOSIS — M2341 Loose body in knee, right knee: Secondary | ICD-10-CM | POA: Diagnosis not present

## 2022-02-06 ENCOUNTER — Inpatient Hospital Stay: Payer: Medicare Other

## 2022-02-06 ENCOUNTER — Inpatient Hospital Stay: Payer: Medicare Other | Attending: Nurse Practitioner | Admitting: Nurse Practitioner

## 2022-02-06 ENCOUNTER — Encounter: Payer: Self-pay | Admitting: Nurse Practitioner

## 2022-02-06 ENCOUNTER — Other Ambulatory Visit: Payer: Self-pay

## 2022-02-06 VITALS — BP 140/95 | HR 74 | Temp 98.0°F | Resp 18 | Wt 254.4 lb

## 2022-02-06 DIAGNOSIS — F17200 Nicotine dependence, unspecified, uncomplicated: Secondary | ICD-10-CM | POA: Diagnosis not present

## 2022-02-06 DIAGNOSIS — Z9049 Acquired absence of other specified parts of digestive tract: Secondary | ICD-10-CM | POA: Diagnosis not present

## 2022-02-06 DIAGNOSIS — Z79899 Other long term (current) drug therapy: Secondary | ICD-10-CM | POA: Insufficient documentation

## 2022-02-06 DIAGNOSIS — C184 Malignant neoplasm of transverse colon: Secondary | ICD-10-CM

## 2022-02-06 DIAGNOSIS — D127 Benign neoplasm of rectosigmoid junction: Secondary | ICD-10-CM | POA: Insufficient documentation

## 2022-02-06 DIAGNOSIS — K644 Residual hemorrhoidal skin tags: Secondary | ICD-10-CM | POA: Diagnosis not present

## 2022-02-06 DIAGNOSIS — M109 Gout, unspecified: Secondary | ICD-10-CM | POA: Insufficient documentation

## 2022-02-06 DIAGNOSIS — D869 Sarcoidosis, unspecified: Secondary | ICD-10-CM | POA: Diagnosis not present

## 2022-02-06 DIAGNOSIS — K648 Other hemorrhoids: Secondary | ICD-10-CM | POA: Diagnosis not present

## 2022-02-06 LAB — CEA (ACCESS): CEA (CHCC): 7.78 ng/mL — ABNORMAL HIGH (ref 0.00–5.00)

## 2022-02-06 NOTE — Progress Notes (Signed)
?  Gary ?OFFICE PROGRESS NOTE ? ? ?Diagnosis: Colon cancer ? ?INTERVAL HISTORY:  ? ?Gary Frederick returns as scheduled.  He feels well.  No change in bowel habits.  No blood or pain with bowel movements.  He denies abdominal pain.  No nausea or vomiting.  He has a good appetite. ? ?Objective: ? ?Vital signs in last 24 hours: ? ?Blood pressure (!) 140/95, pulse 74, temperature 98 ?F (36.7 ?C), temperature source Tympanic, resp. rate 18, weight 254 lb 6.4 oz (115.4 kg), SpO2 98 %. ?  ? ?Lymphatics: No palpable cervical, supraclavicular, axillary or inguinal lymph nodes. ?Resp: Lungs clear bilaterally. ?Cardio: Regular rate and rhythm. ?GI: Abdomen soft and nontender.  No hepatosplenomegaly.  No mass. ?Vascular: No leg edema. ? ? ?Lab Results: ? ?Lab Results  ?Component Value Date  ? WBC 7.8 02/19/2020  ? HGB 16.5 02/19/2020  ? HCT 48.2 02/19/2020  ? MCV 91.6 02/19/2020  ? PLT 214 02/19/2020  ? NEUTROABS 5.0 02/19/2020  ? ? ?Imaging: ? ?No results found. ? ?Medications: I have reviewed the patient's current medications. ? ?Assessment/Plan: ?Adenocarcinoma transverse colon , status post a right colectomy on 12/11/2012, stage II (T3 N0) well differentiated adenocarcinoma, the tumor is microsatellite stable and no loss of expression of mismatch repair proteins. ?Indeterminate right liver lesion on a CT 10/13/2012, status post intraoperative ultrasound and biopsy on 12/11/2012 with no malignancy found; stable liver on a CT of the abdomen May 2014.   ?Mildly elevated preoperative CEA. ?Gout.   ?History of cutaneous nodular lesions-likely sebaceous cysts versus sarcoidosis.   ?Calcified chest lymph nodes on a CT 10/27/2012, "granuloma "found on the surgical pathology from the liver biopsy 12/11/2012-? Sarcoidosis. The ACE level was not elevated on 01/13/2013. ?Tobacco use. He was encouraged to stop smoking.  Followed in the lung cancer screening program. ?Colonoscopy 09/22/2013. 5 sessile polyps were  found in the rectum and in the transverse colon. The polyps 2-3 mm in size. External and internal hemorrhoids ?Colonoscopy 07/07/2019-eight 2-4 mm polyps in the rectum, rectosigmoid, descending, and transverse colon removed-tubular adenomas, sessile serrated adenoma ?  ? ?Disposition: Gary Frederick remains in clinical remission from colon cancer.  We will follow-up on the CEA from today.  He continues colonoscopy surveillance with Dr. Benson Norway.  He would like to continue follow-up at the [facility].  He will return for a follow-up visit in 1 year. ? ? ? ?Ned Card ANP/GNP-BC  ? ?02/06/2022  ?1:47 PM ? ? ? ? ? ? ? ?

## 2022-02-07 DIAGNOSIS — S83281D Other tear of lateral meniscus, current injury, right knee, subsequent encounter: Secondary | ICD-10-CM | POA: Diagnosis not present

## 2022-02-07 DIAGNOSIS — S83241D Other tear of medial meniscus, current injury, right knee, subsequent encounter: Secondary | ICD-10-CM | POA: Diagnosis not present

## 2022-02-07 DIAGNOSIS — M25661 Stiffness of right knee, not elsewhere classified: Secondary | ICD-10-CM | POA: Diagnosis not present

## 2022-02-07 DIAGNOSIS — M6281 Muscle weakness (generalized): Secondary | ICD-10-CM | POA: Diagnosis not present

## 2022-02-07 DIAGNOSIS — M25561 Pain in right knee: Secondary | ICD-10-CM | POA: Diagnosis not present

## 2022-02-13 ENCOUNTER — Telehealth: Payer: Self-pay

## 2022-02-13 DIAGNOSIS — S83281D Other tear of lateral meniscus, current injury, right knee, subsequent encounter: Secondary | ICD-10-CM | POA: Diagnosis not present

## 2022-02-13 DIAGNOSIS — M6281 Muscle weakness (generalized): Secondary | ICD-10-CM | POA: Diagnosis not present

## 2022-02-13 DIAGNOSIS — M25661 Stiffness of right knee, not elsewhere classified: Secondary | ICD-10-CM | POA: Diagnosis not present

## 2022-02-13 DIAGNOSIS — M25561 Pain in right knee: Secondary | ICD-10-CM | POA: Diagnosis not present

## 2022-02-13 DIAGNOSIS — S83241D Other tear of medial meniscus, current injury, right knee, subsequent encounter: Secondary | ICD-10-CM | POA: Diagnosis not present

## 2022-02-13 NOTE — Telephone Encounter (Signed)
Called and left a message to the patient to let him know the CEA remains mildly elevated, and we ae going to repeat CEA in 6 months. If patient have any questions or concerns called the nurse back. ?

## 2022-02-13 NOTE — Telephone Encounter (Signed)
-----   Message from [name], NP sent at 02/12/2022 10:02 AM EDT ----- ?Please let him know the CEA remains mildly elevated.  Repeat in 6 months. ? ?

## 2022-02-15 DIAGNOSIS — S83241D Other tear of medial meniscus, current injury, right knee, subsequent encounter: Secondary | ICD-10-CM | POA: Diagnosis not present

## 2022-02-15 DIAGNOSIS — M25661 Stiffness of right knee, not elsewhere classified: Secondary | ICD-10-CM | POA: Diagnosis not present

## 2022-02-15 DIAGNOSIS — M25561 Pain in right knee: Secondary | ICD-10-CM | POA: Diagnosis not present

## 2022-02-15 DIAGNOSIS — S83281D Other tear of lateral meniscus, current injury, right knee, subsequent encounter: Secondary | ICD-10-CM | POA: Diagnosis not present

## 2022-02-15 DIAGNOSIS — M6281 Muscle weakness (generalized): Secondary | ICD-10-CM | POA: Diagnosis not present

## 2022-02-20 DIAGNOSIS — S83281D Other tear of lateral meniscus, current injury, right knee, subsequent encounter: Secondary | ICD-10-CM | POA: Diagnosis not present

## 2022-02-20 DIAGNOSIS — M6281 Muscle weakness (generalized): Secondary | ICD-10-CM | POA: Diagnosis not present

## 2022-02-20 DIAGNOSIS — S83241D Other tear of medial meniscus, current injury, right knee, subsequent encounter: Secondary | ICD-10-CM | POA: Diagnosis not present

## 2022-02-20 DIAGNOSIS — M25661 Stiffness of right knee, not elsewhere classified: Secondary | ICD-10-CM | POA: Diagnosis not present

## 2022-02-20 DIAGNOSIS — M25561 Pain in right knee: Secondary | ICD-10-CM | POA: Diagnosis not present

## 2022-02-22 DIAGNOSIS — S83281D Other tear of lateral meniscus, current injury, right knee, subsequent encounter: Secondary | ICD-10-CM | POA: Diagnosis not present

## 2022-02-22 DIAGNOSIS — M25561 Pain in right knee: Secondary | ICD-10-CM | POA: Diagnosis not present

## 2022-02-22 DIAGNOSIS — M25661 Stiffness of right knee, not elsewhere classified: Secondary | ICD-10-CM | POA: Diagnosis not present

## 2022-02-22 DIAGNOSIS — S83241D Other tear of medial meniscus, current injury, right knee, subsequent encounter: Secondary | ICD-10-CM | POA: Diagnosis not present

## 2022-02-22 DIAGNOSIS — M6281 Muscle weakness (generalized): Secondary | ICD-10-CM | POA: Diagnosis not present

## 2022-02-27 DIAGNOSIS — S83241D Other tear of medial meniscus, current injury, right knee, subsequent encounter: Secondary | ICD-10-CM | POA: Diagnosis not present

## 2022-02-27 DIAGNOSIS — M25561 Pain in right knee: Secondary | ICD-10-CM | POA: Diagnosis not present

## 2022-02-27 DIAGNOSIS — E1169 Type 2 diabetes mellitus with other specified complication: Secondary | ICD-10-CM | POA: Diagnosis not present

## 2022-02-27 DIAGNOSIS — M25661 Stiffness of right knee, not elsewhere classified: Secondary | ICD-10-CM | POA: Diagnosis not present

## 2022-02-27 DIAGNOSIS — E782 Mixed hyperlipidemia: Secondary | ICD-10-CM | POA: Diagnosis not present

## 2022-02-27 DIAGNOSIS — S83281D Other tear of lateral meniscus, current injury, right knee, subsequent encounter: Secondary | ICD-10-CM | POA: Diagnosis not present

## 2022-02-27 DIAGNOSIS — M6281 Muscle weakness (generalized): Secondary | ICD-10-CM | POA: Diagnosis not present

## 2022-03-01 ENCOUNTER — Other Ambulatory Visit: Payer: Self-pay | Admitting: Family Medicine

## 2022-03-01 DIAGNOSIS — J439 Emphysema, unspecified: Secondary | ICD-10-CM

## 2022-03-02 DIAGNOSIS — I10 Essential (primary) hypertension: Secondary | ICD-10-CM | POA: Diagnosis not present

## 2022-03-02 DIAGNOSIS — E118 Type 2 diabetes mellitus with unspecified complications: Secondary | ICD-10-CM | POA: Diagnosis not present

## 2022-03-02 DIAGNOSIS — E7849 Other hyperlipidemia: Secondary | ICD-10-CM | POA: Diagnosis not present

## 2022-03-05 DIAGNOSIS — M25561 Pain in right knee: Secondary | ICD-10-CM | POA: Diagnosis not present

## 2022-03-15 ENCOUNTER — Emergency Department (HOSPITAL_COMMUNITY): Payer: Medicare Other

## 2022-03-15 ENCOUNTER — Inpatient Hospital Stay (HOSPITAL_COMMUNITY)
Admission: EM | Admit: 2022-03-15 | Discharge: 2022-03-17 | DRG: 065 | Disposition: A | Discharge status: Home Health | Payer: Medicare Other | Attending: Internal Medicine | Admitting: Internal Medicine

## 2022-03-15 ENCOUNTER — Encounter (HOSPITAL_COMMUNITY): Payer: Self-pay | Admitting: *Deleted

## 2022-03-15 DIAGNOSIS — I472 Ventricular tachycardia, unspecified: Secondary | ICD-10-CM | POA: Diagnosis not present

## 2022-03-15 DIAGNOSIS — I639 Cerebral infarction, unspecified: Secondary | ICD-10-CM

## 2022-03-15 DIAGNOSIS — Z7982 Long term (current) use of aspirin: Secondary | ICD-10-CM

## 2022-03-15 DIAGNOSIS — I480 Paroxysmal atrial fibrillation: Secondary | ICD-10-CM | POA: Diagnosis not present

## 2022-03-15 DIAGNOSIS — I63411 Cerebral infarction due to embolism of right middle cerebral artery: Secondary | ICD-10-CM | POA: Diagnosis not present

## 2022-03-15 DIAGNOSIS — E7800 Pure hypercholesterolemia, unspecified: Secondary | ICD-10-CM | POA: Diagnosis not present

## 2022-03-15 DIAGNOSIS — F1010 Alcohol abuse, uncomplicated: Secondary | ICD-10-CM | POA: Diagnosis present

## 2022-03-15 DIAGNOSIS — F1210 Cannabis abuse, uncomplicated: Secondary | ICD-10-CM | POA: Diagnosis present

## 2022-03-15 DIAGNOSIS — I63311 Cerebral infarction due to thrombosis of right middle cerebral artery: Secondary | ICD-10-CM | POA: Diagnosis not present

## 2022-03-15 DIAGNOSIS — Z823 Family history of stroke: Secondary | ICD-10-CM

## 2022-03-15 DIAGNOSIS — I63233 Cerebral infarction due to unspecified occlusion or stenosis of bilateral carotid arteries: Secondary | ICD-10-CM | POA: Diagnosis not present

## 2022-03-15 DIAGNOSIS — I6389 Other cerebral infarction: Secondary | ICD-10-CM | POA: Diagnosis not present

## 2022-03-15 DIAGNOSIS — Z6831 Body mass index (BMI) 31.0-31.9, adult: Secondary | ICD-10-CM

## 2022-03-15 DIAGNOSIS — R29704 NIHSS score 4: Secondary | ICD-10-CM | POA: Diagnosis present

## 2022-03-15 DIAGNOSIS — R29810 Facial weakness: Principal | ICD-10-CM | POA: Diagnosis present

## 2022-03-15 DIAGNOSIS — F17200 Nicotine dependence, unspecified, uncomplicated: Secondary | ICD-10-CM | POA: Diagnosis not present

## 2022-03-15 DIAGNOSIS — E785 Hyperlipidemia, unspecified: Secondary | ICD-10-CM

## 2022-03-15 DIAGNOSIS — I63441 Cerebral infarction due to embolism of right cerebellar artery: Secondary | ICD-10-CM | POA: Diagnosis present

## 2022-03-15 DIAGNOSIS — N179 Acute kidney failure, unspecified: Secondary | ICD-10-CM

## 2022-03-15 DIAGNOSIS — Z7985 Long-term (current) use of injectable non-insulin antidiabetic drugs: Secondary | ICD-10-CM

## 2022-03-15 DIAGNOSIS — Z820 Family history of epilepsy and other diseases of the nervous system: Secondary | ICD-10-CM

## 2022-03-15 DIAGNOSIS — I6521 Occlusion and stenosis of right carotid artery: Secondary | ICD-10-CM | POA: Diagnosis not present

## 2022-03-15 DIAGNOSIS — E1159 Type 2 diabetes mellitus with other circulatory complications: Secondary | ICD-10-CM | POA: Diagnosis not present

## 2022-03-15 DIAGNOSIS — M109 Gout, unspecified: Secondary | ICD-10-CM | POA: Diagnosis not present

## 2022-03-15 DIAGNOSIS — T45516A Underdosing of anticoagulants, initial encounter: Secondary | ICD-10-CM | POA: Diagnosis present

## 2022-03-15 DIAGNOSIS — I4729 Other ventricular tachycardia: Secondary | ICD-10-CM | POA: Clinically undetermined

## 2022-03-15 DIAGNOSIS — I10 Essential (primary) hypertension: Secondary | ICD-10-CM | POA: Diagnosis not present

## 2022-03-15 DIAGNOSIS — Z9049 Acquired absence of other specified parts of digestive tract: Secondary | ICD-10-CM

## 2022-03-15 DIAGNOSIS — R471 Dysarthria and anarthria: Secondary | ICD-10-CM | POA: Diagnosis not present

## 2022-03-15 DIAGNOSIS — F17210 Nicotine dependence, cigarettes, uncomplicated: Secondary | ICD-10-CM | POA: Diagnosis present

## 2022-03-15 DIAGNOSIS — Z79899 Other long term (current) drug therapy: Secondary | ICD-10-CM

## 2022-03-15 DIAGNOSIS — E669 Obesity, unspecified: Secondary | ICD-10-CM | POA: Diagnosis present

## 2022-03-15 DIAGNOSIS — I63431 Cerebral infarction due to embolism of right posterior cerebral artery: Secondary | ICD-10-CM | POA: Diagnosis not present

## 2022-03-15 DIAGNOSIS — Z8249 Family history of ischemic heart disease and other diseases of the circulatory system: Secondary | ICD-10-CM

## 2022-03-15 DIAGNOSIS — R4701 Aphasia: Secondary | ICD-10-CM | POA: Diagnosis not present

## 2022-03-15 DIAGNOSIS — E119 Type 2 diabetes mellitus without complications: Secondary | ICD-10-CM

## 2022-03-15 DIAGNOSIS — J341 Cyst and mucocele of nose and nasal sinus: Secondary | ICD-10-CM | POA: Diagnosis not present

## 2022-03-15 DIAGNOSIS — Z85038 Personal history of other malignant neoplasm of large intestine: Secondary | ICD-10-CM

## 2022-03-15 DIAGNOSIS — I4892 Unspecified atrial flutter: Secondary | ICD-10-CM | POA: Diagnosis not present

## 2022-03-15 DIAGNOSIS — I6340 Cerebral infarction due to embolism of unspecified cerebral artery: Secondary | ICD-10-CM | POA: Diagnosis not present

## 2022-03-15 DIAGNOSIS — Z833 Family history of diabetes mellitus: Secondary | ICD-10-CM

## 2022-03-15 DIAGNOSIS — Y902 Blood alcohol level of 40-59 mg/100 ml: Secondary | ICD-10-CM | POA: Diagnosis present

## 2022-03-15 HISTORY — DX: Acute kidney failure, unspecified: N17.9

## 2022-03-15 HISTORY — DX: Cerebral infarction, unspecified: I63.9

## 2022-03-15 HISTORY — DX: Type 2 diabetes mellitus without complications: E11.9

## 2022-03-15 LAB — I-STAT CHEM 8, ED
BUN: 13 mg/dL (ref 8–23)
Calcium, Ion: 1 mmol/L — ABNORMAL LOW (ref 1.15–1.40)
Chloride: 102 mmol/L (ref 98–111)
Creatinine, Ser: 1.8 mg/dL — ABNORMAL HIGH (ref 0.61–1.24)
Glucose, Bld: 127 mg/dL — ABNORMAL HIGH (ref 70–99)
HCT: 48 % (ref 39.0–52.0)
Hemoglobin: 16.3 g/dL (ref 13.0–17.0)
Potassium: 3.7 mmol/L (ref 3.5–5.1)
Sodium: 138 mmol/L (ref 135–145)
TCO2: 24 mmol/L (ref 22–32)

## 2022-03-15 LAB — CBC
HCT: 46.2 % (ref 39.0–52.0)
Hemoglobin: 15.8 g/dL (ref 13.0–17.0)
MCH: 31 pg (ref 26.0–34.0)
MCHC: 34.2 g/dL (ref 30.0–36.0)
MCV: 90.8 fL (ref 80.0–100.0)
Platelets: 214 10*3/uL (ref 150–400)
RBC: 5.09 MIL/uL (ref 4.22–5.81)
RDW: 12.9 % (ref 11.5–15.5)
WBC: 11.1 10*3/uL — ABNORMAL HIGH (ref 4.0–10.5)
nRBC: 0 % (ref 0.0–0.2)

## 2022-03-15 LAB — COMPREHENSIVE METABOLIC PANEL
ALT: 34 U/L (ref 0–44)
AST: 31 U/L (ref 15–41)
Albumin: 4 g/dL (ref 3.5–5.0)
Alkaline Phosphatase: 43 U/L (ref 38–126)
Anion gap: 13 (ref 5–15)
BUN: 11 mg/dL (ref 8–23)
CO2: 23 mmol/L (ref 22–32)
Calcium: 9.2 mg/dL (ref 8.9–10.3)
Chloride: 102 mmol/L (ref 98–111)
Creatinine, Ser: 1.66 mg/dL — ABNORMAL HIGH (ref 0.61–1.24)
GFR, Estimated: 46 mL/min — ABNORMAL LOW (ref >60)
Glucose, Bld: 129 mg/dL — ABNORMAL HIGH (ref 70–99)
Potassium: 3.7 mmol/L (ref 3.5–5.1)
Sodium: 138 mmol/L (ref 135–145)
Total Bilirubin: 0.7 mg/dL (ref 0.3–1.2)
Total Protein: 7.3 g/dL (ref 6.5–8.1)

## 2022-03-15 LAB — RAPID URINE DRUG SCREEN, HOSP PERFORMED
Amphetamines: NOT DETECTED
Barbiturates: NOT DETECTED
Benzodiazepines: NOT DETECTED
Cocaine: NOT DETECTED
Opiates: NOT DETECTED
Tetrahydrocannabinol: POSITIVE — AB

## 2022-03-15 LAB — URINALYSIS, ROUTINE W REFLEX MICROSCOPIC
Bilirubin Urine: NEGATIVE
Glucose, UA: NEGATIVE mg/dL
Hgb urine dipstick: NEGATIVE
Ketones, ur: NEGATIVE mg/dL
Leukocytes,Ua: NEGATIVE
Nitrite: NEGATIVE
Protein, ur: 30 mg/dL — AB
Specific Gravity, Urine: 1.03 (ref 1.005–1.030)
pH: 5 (ref 5.0–8.0)

## 2022-03-15 LAB — DIFFERENTIAL
Abs Immature Granulocytes: 0.04 10*3/uL (ref 0.00–0.07)
Basophils Absolute: 0 10*3/uL (ref 0.0–0.1)
Basophils Relative: 0 %
Eosinophils Absolute: 0 10*3/uL (ref 0.0–0.5)
Eosinophils Relative: 0 %
Immature Granulocytes: 0 %
Lymphocytes Relative: 16 %
Lymphs Abs: 1.8 10*3/uL (ref 0.7–4.0)
Monocytes Absolute: 1.1 10*3/uL — ABNORMAL HIGH (ref 0.1–1.0)
Monocytes Relative: 10 %
Neutro Abs: 8.2 10*3/uL — ABNORMAL HIGH (ref 1.7–7.7)
Neutrophils Relative %: 74 %

## 2022-03-15 LAB — CBG MONITORING, ED: Glucose-Capillary: 136 mg/dL — ABNORMAL HIGH (ref 70–99)

## 2022-03-15 LAB — ETHANOL: Alcohol, Ethyl (B): 52 mg/dL — ABNORMAL HIGH (ref <10)

## 2022-03-15 LAB — APTT: aPTT: 28 seconds (ref 24–36)

## 2022-03-15 LAB — PROTIME-INR
INR: 1 (ref 0.8–1.2)
Prothrombin Time: 13.3 seconds (ref 11.4–15.2)

## 2022-03-15 MED ORDER — FOLIC ACID 1 MG PO TABS
1.0000 mg | ORAL_TABLET | Freq: Every day | ORAL | Status: DC
  Administered 2022-03-15 – 2022-03-17 (×3): 1 mg via ORAL
  Filled 2022-03-15 (×3): qty 1

## 2022-03-15 MED ORDER — SODIUM CHLORIDE 0.9 % IV SOLN: INTRAVENOUS | Status: AC

## 2022-03-15 MED ORDER — NICOTINE 14 MG/24HR TD PT24
14.0000 mg | MEDICATED_PATCH | Freq: Every day | TRANSDERMAL | Status: DC
  Administered 2022-03-15 – 2022-03-17 (×3): 14 mg via TRANSDERMAL
  Filled 2022-03-15 (×3): qty 1

## 2022-03-15 MED ORDER — THIAMINE HCL 100 MG/ML IJ SOLN
100.0000 mg | Freq: Every day | INTRAMUSCULAR | Status: DC
  Filled 2022-03-15: qty 2

## 2022-03-15 MED ORDER — ENOXAPARIN SODIUM 60 MG/0.6ML IJ SOSY
55.0000 mg | PREFILLED_SYRINGE | Freq: Every day | INTRAMUSCULAR | Status: DC
  Administered 2022-03-15: 55 mg via SUBCUTANEOUS
  Filled 2022-03-15: qty 0.6

## 2022-03-15 MED ORDER — ASPIRIN EC 81 MG PO TBEC
81.0000 mg | DELAYED_RELEASE_TABLET | Freq: Every day | ORAL | Status: DC
  Administered 2022-03-16: 81 mg via ORAL
  Filled 2022-03-15: qty 1

## 2022-03-15 MED ORDER — ONDANSETRON HCL 4 MG PO TABS: 4.0000 mg | ORAL_TABLET | Freq: Four times a day (QID) | ORAL | Status: DC | PRN

## 2022-03-15 MED ORDER — LORAZEPAM 1 MG PO TABS: 1.0000 mg | ORAL_TABLET | ORAL | Status: DC | PRN

## 2022-03-15 MED ORDER — ACETAMINOPHEN 650 MG RE SUPP: 650.0000 mg | Freq: Four times a day (QID) | RECTAL | Status: DC | PRN

## 2022-03-15 MED ORDER — IOHEXOL 350 MG/ML SOLN
100.0000 mL | Freq: Once | INTRAVENOUS | Status: AC | PRN
  Administered 2022-03-15: 50 mL via INTRAVENOUS

## 2022-03-15 MED ORDER — ONDANSETRON HCL 4 MG/2ML IJ SOLN: 4.0000 mg | Freq: Four times a day (QID) | INTRAMUSCULAR | Status: DC | PRN

## 2022-03-15 MED ORDER — ASPIRIN 325 MG PO TABS
325.0000 mg | ORAL_TABLET | Freq: Every day | ORAL | Status: DC
  Administered 2022-03-15: 325 mg via ORAL
  Filled 2022-03-15: qty 1

## 2022-03-15 MED ORDER — THIAMINE HCL 100 MG PO TABS
100.0000 mg | ORAL_TABLET | Freq: Every day | ORAL | Status: DC
  Administered 2022-03-15 – 2022-03-17 (×3): 100 mg via ORAL
  Filled 2022-03-15 (×3): qty 1

## 2022-03-15 MED ORDER — ASPIRIN 300 MG RE SUPP
300.0000 mg | Freq: Once | RECTAL | Status: DC
  Filled 2022-03-15: qty 1

## 2022-03-15 MED ORDER — ADULT MULTIVITAMIN W/MINERALS CH
1.0000 | ORAL_TABLET | Freq: Every day | ORAL | Status: DC
  Administered 2022-03-15 – 2022-03-17 (×3): 1 via ORAL
  Filled 2022-03-15 (×3): qty 1

## 2022-03-15 MED ORDER — ACETAMINOPHEN 325 MG PO TABS: 650.0000 mg | ORAL_TABLET | Freq: Four times a day (QID) | ORAL | Status: DC | PRN

## 2022-03-15 MED ORDER — LORAZEPAM 2 MG/ML IJ SOLN: 1.0000 mg | INTRAMUSCULAR | Status: DC | PRN

## 2022-03-15 NOTE — ED Triage Notes (Addendum)
Pt reports feeling fine last night. Woke up this am around 0400, had episode of n/v and noticed left side weakness, facial droop and slurred speech.  ?

## 2022-03-15 NOTE — Consult Note (Addendum)
NEUROLOGY CONSULTATION NOTE  ? ?Date of service: March 15, 2022 ?Patient Name: Gary Frederick ?MRN:  [id number] ?DOB:  14-May-1956 ?Reason for consult: "embolic stroke" ?Requesting Provider: Charlann Lange, MD ? ?History of Present Illness  ?Gary Frederick is a 66 y.o. male with PMH aflutter (was on eliquis), DM2, HLD, HTN, tobacco abuse, EtOH abuse, Cannabis use, ARF, gout, colon cancer in remission who presented with right facial droop, aphasia, dysarthria.  ? ?Family at bedside.  ? ?03/15/2022 @ 4am patient woke up with change in speech, then woke up at around 9am and noted to have dysarthria and right facial droop.  ?Prior to symptom onset, he went to bed at around 2am and had about 3 beers prior. Patient has a h/o of aflutter diagnosed and stated on eliquis by PCP. His cardiologist d/c'd it because his CHADSVASC was low.  ?He reported seldom EtOH abuse and denied smoking for substance abuse currently.  ? ?On evaluation patient still had moderate dysarthria and right facial droop. Otherwise no other concerns. ? ?LKW: 0230 on 03/15/22. ?mRS: 0 ?tNKASE: not offered, outside window. Strokes appear completed on CTH in the R occipital region and R MCA with ASPECTS of 8. ?Thrombectomy: not offered, no LVO ?NIHSS components Score: Comment  ?1a Level of Conscious 0'[x]'$  1'[]'$  2'[]'$  3'[]'$      ?1b LOC Questions 0'[x]'$  1'[]'$  2'[]'$       ?1c LOC Commands 0'[x]'$  1'[]'$  2'[]'$       ?2 Best Gaze 0'[x]'$  1'[]'$  2'[]'$       ?3 Visual 0'[]'$  1'[x]'$  2'[]'$  3'[]'$      ?4 Facial Palsy 0'[]'$  1'[]'$  2'[x]'$  3'[]'$      ?5a Motor Arm - left 0'[x]'$  1'[]'$  2'[]'$  3'[]'$  4'[]'$  UN'[]'$    ?5b Motor Arm - Right 0'[x]'$  1'[]'$  2'[]'$  3'[]'$  4'[]'$  UN'[]'$    ?6a Motor Leg - Left 0'[x]'$  1'[]'$  2'[]'$  3'[]'$  4'[]'$  UN'[]'$    ?6b Motor Leg - Right 0'[x]'$  1'[]'$  2'[]'$  3'[]'$  4'[]'$  UN'[]'$    ?7 Limb Ataxia 0'[x]'$  1'[]'$  2'[]'$  3'[]'$  UN'[]'$     ?8 Sensory 0'[x]'$  1'[]'$  2'[]'$  UN'[]'$      ?9 Best Language 0'[x]'$  1'[]'$  2'[]'$  3'[]'$      ?10 Dysarthria 0'[]'$  1'[]'$  2'[x]'$  UN'[]'$      ?11 Extinct. and Inattention 0'[x]'$  1'[]'$  2'[]'$       ?TOTAL: 5   ? ? ? ?ROS  ? ?Constitutional Denies weight loss, fever and chills.   ?HEENT  Changes in vision  ?Neurological Denies headache and syncope.   ? ?Past History  ? ?Past Medical History:  ?Diagnosis Date  ? Arthritis   ? Cancer ([place]) 10/2012  ? colon  ? Constipation   ? Diabetes mellitus without complication ([place])   ? Gout   ? Hyperlipidemia   ? Hypertension   ? Insomnia   ? ?Past Surgical History:  ?Procedure Laterality Date  ? CARPAL TUNNEL RELEASE    ? right  ? COLON RESECTION  12/11/2012  ? Procedure: LAPAROSCOPIC RIGHT COLON RESECTION;  Surgeon: Leighton Ruff, MD;  Location: [facility];  Service: General;  Laterality: Right;  ? COLON SURGERY  12/11/2012  ? LIVER ULTRASOUND  12/11/2012  ? Procedure: LIVER ULTRASOUND;  Surgeon: Leighton Ruff, MD;  Location: [facility];  Service: General;  Laterality: Right;  Intraoperative Liver Ultrasound  ? ?Family History  ?Problem Relation Age of Onset  ? Diabetes Mother   ? Stroke Mother   ? Huntington's disease Father   ? Hypertension Brother   ? ?Social History  ? ?Socioeconomic History  ? Marital status: Married  ?  Spouse name: Sheryl  ? Number of children: 4  ? Years of education: Not on file  ? Highest education level: Not on file  ?Occupational History  ? Occupation: Trucking  ?  Comment: Disabled  ?Tobacco Use  ? Smoking status: Every Day  ?  Packs/day: 1.00  ?  Years: 30.00  ?  Pack years: 30.00  ?  Types: Cigarettes  ? Smokeless tobacco: Never  ?Vaping Use  ? Vaping Use: Never used  ?Substance and Sexual Activity  ? Alcohol use: Yes  ?  Alcohol/week: 5.0 standard drinks  ?  Types: 5 Cans of beer per week  ? Drug use: No  ? Sexual activity: Not on file  ?Other Topics Concern  ? Not on file  ?Social History Narrative  ? Not on file  ? ?Social Determinants of Health  ? ?Financial Resource Strain: Not on file  ?Food Insecurity: Not on file  ?Transportation Needs: Not on file  ?Physical Activity: Not on file  ?Stress: Not on file  ?Social Connections: Not on file  ? ?No Known Allergies ? ?Medications  ?(Not in a hospital admission) ?  ? ?Vitals  ? ?Vitals:  ?  03/15/22 1115 03/15/22 1130 03/15/22 1205 03/15/22 1240  ?BP: 114/77 134/82 (!) 137/92 128/87  ?Pulse: 89 91 91 83  ?Resp: '16 20 20 14  '$ ?Temp: 98.8 ?F (37.1 ?C)     ?SpO2: 94%  96% 96%  ?  ? ?There is no height or weight on file to calculate BMI. ? ?Physical Exam  ?General: Laying comfortably in bed; in no acute distress.  ?Pulmonary: Symmetric chest rise. Non-labored respiratory effort.  ? ?Neurologic Examination  ?Mental status/Cognition:  ?Alert, oriented to self, place, month and year, good attention-able to follow conversation well.  ?Speech/language:  ?Fluent, thought content appropriate. Moderate dysarthria - difficult to understand ~1/2 of words.  ?Comprehension intact-able to follow 3 step commands without difficulty.  ?Object naming intact, repetition intact.    ? ?Cranial Nerves: ?II: Pupils equal and reactive to light, some Left temporal quadrantanopsia. ?III,IV, VI: extra-ocular motions intact bilaterally pupils equal, round, reactive to light and accommodation, no gaze preference or deviation, no nystagmus  ?V: facial light touch sensation normal bilaterally ?VII: right facial droop  ?VIII: hearing normal bilaterally ?IX,X: cough intact ?XI: bilateral shoulder shrug, head turn ?XII: midline tongue extension ?Motor: ?Right : Upper extremity   5/5    Left:     Upper extremity   5/5 ? Lower extremity   5/5     Lower extremity   5/5 ?Tone and bulk:normal tone throughout; no atrophy noted ? ?Sensory: Pinprick and light touch intact throughout, bilaterally ? ?Coordination/Complex Motor:  ?- Finger to Nose  ?- Rapid alternating movement  ?- Heel to shin  ?- Gait: deferred ? ?Labs  ? ?CBC:  ?Recent Labs  ?Lab 03/15/22 ?1110 03/15/22 ?1121  ?WBC 11.1*  --   ?NEUTROABS 8.2*  --   ?HGB 15.8 16.3  ?HCT 46.2 48.0  ?MCV 90.8  --   ?PLT 214  --   ? ? ?Basic Metabolic Panel:  ?Lab Results  ?Component Value Date  ? NA 138 03/15/2022  ? K 3.7 03/15/2022  ? CO2 23 03/15/2022  ? GLUCOSE 127 (H) 03/15/2022  ? BUN 13  03/15/2022  ? CREATININE 1.80 (H) 03/15/2022  ? CALCIUM 9.2 03/15/2022  ? GFRNONAA 46 (L) 03/15/2022  ? GFRAA >60 02/19/2020  ? ?Lipid Panel: No results found for: [place] ?HgbA1c: No results found  for: HGBA1C ?Urine Drug Screen:  ?   ?Component Value Date/Time  ? LABOPIA NONE DETECTED 03/15/2022 1204  ? COCAINSCRNUR NONE DETECTED 03/15/2022 1204  ? LABBENZ NONE DETECTED 03/15/2022 1204  ? AMPHETMU NONE DETECTED 03/15/2022 1204  ? THCU POSITIVE (A) 03/15/2022 1204  ? LABBARB NONE DETECTED 03/15/2022 1204  ?  ?Alcohol Level  ?   ?Component Value Date/Time  ? ETH 52 (H) 03/15/2022 1110  ? ? ?CT Head without contrast(Personally reviewed): ?Acute cortical/subcortical right MCA territory infarct within the mid-to-posterior right frontal lobe. ASPECTS is 8. Additional small acute cortical/subcortical infarct within the right occipital lobe (right PCA vascular territory). A subcentimeter infarct within the inferior left cerebellar hemisphere is new from the prior head CT of 02/17/2014, but otherwise age-indeterminate Question subtle asymmetric hyperdensity of right M2 MCA vessels, which could reflect the presence of endoluminal thrombus. ? ?CT angio Head and Neck with contrast(Personally reviewed): ?1. Negative for intracranial large vessel occlusion 2. No significant extracranial stenosis in the carotid or vertebral arteries. 3. Moderate calcific stenosis of the supraclinoid internal carotid artery bilaterally ? ? ?Impression  ?Gary Frederick is a 66 y.o. male with PMH aflutter (was on eliquis), DM2, HLD, HTN, tobacco abuse, EtOH abuse, Cannabis use, ARF, gout, colon cancer in remission who presented with right facial droop, aphasia, dysarthria.  ? ?Patient embolic stroke pattern seen in right MCA and right occipital region, likely cardiogenic given his h/o aflutter and his eliquis d/c'd for low CHADSVASC.  ? ? ?Recommendations  ? ?- Frequent Neuro checks per stroke unit protocol ?- Recommend brain imaging with MRI  Brain without contrast ?- Recommend obtaining TTE ?- Recommend obtaining Lipid panel with LDL ?- Please start statin if LDL > 70 ?- Recommend HbA1c ?- Antithrombotic - Aspirin '81mg'$  daily for now. Timing of A

## 2022-03-15 NOTE — Evaluation (Signed)
Clinical/Bedside Swallow Evaluation ?Patient Details  ?Name: Gary Frederick ?MRN: [id number] ?Date of Birth: [birth year] ? ?Today's Date: 03/15/2022 ?Time: SLP Start Time (ACUTE ONLY): 8144 SLP Stop Time (ACUTE ONLY): 1300 ?SLP Time Calculation (min) (ACUTE ONLY): 30 min ? ?Past Medical History:  ?Past Medical History:  ?Diagnosis Date  ? Arthritis   ? Cancer ([place]) 10/2012  ? colon  ? Constipation   ? Diabetes mellitus without complication ([place])   ? Gout   ? Hyperlipidemia   ? Hypertension   ? Insomnia   ? ?Past Surgical History:  ?Past Surgical History:  ?Procedure Laterality Date  ? CARPAL TUNNEL RELEASE    ? right  ? COLON RESECTION  12/11/2012  ? Procedure: LAPAROSCOPIC RIGHT COLON RESECTION;  Surgeon: Leighton Ruff, MD;  Location: [facility];  Service: General;  Laterality: Right;  ? COLON SURGERY  12/11/2012  ? LIVER ULTRASOUND  12/11/2012  ? Procedure: LIVER ULTRASOUND;  Surgeon: Leighton Ruff, MD;  [facility]: [facility];  Service: General;  Laterality: Right;  Intraoperative Liver Ultrasound  ? ?HPI:  ?65yo male admitted 03/15/22 with n/v, left weakness, facial droop and slurred speech. CTHead = acute (sub)cortical R MCA territory infarct within the mid-posterior right frontal lobe.  ?  ?Assessment / Plan / Recommendation  ?Clinical Impression ? Pt seen at bedside for assessment of swallow function and safety. Pt was sitting upright in bed, awake and alert, pleasant and cooperative. Wife in attendance at bedside. Pt presents with moderate+ dysarthria due to marked left orofacial weakness, decreased vocal loudness and rapid rate of speech. Pt requires ongoing cues to decrease rate and overarticulate to improve intelligibility. Pt completed oral care after set up. Pt was given individual ice chips x2. Left anterior leakage was noted x1. Straw sips of thin liquid were tolerated well, without anterior leakage. Pt consumed 3oz of water consecutively, without overt s/s aspiration. Pt self fed puree, with left neglect observed  - pt touched the left side of his lips with the spoon and applesauce was deposited on his lips. Cracker was tolerated well without left oral residue. No overt s/s aspiration observed following any consistency, despite multiple presentations. At this time, a ground diet diet with thin liquids is recommended. Pt was encouraged to take small bites/sips at a slow rate, frequently check oral cavity for residue, remain upright during and for 30 minutes after meals to facilitate esophageal clearing. Safe swallow precautions were posted at [facility] and reviewed with RN. MD informed of results and recommendations. SLP will follow to assess diet tolerance and determine readiness to advance. Orders for SLE recommended due to moderate dysarthria and orofacial weakness. ? ?SLP Visit Diagnosis: Dysphagia, unspecified (R13.10) ?   ?Aspiration Risk ? Mild aspiration risk  ?  ?Diet Recommendation Dysphagia 2 (Fine chop);Thin liquid  ? ?Liquid Administration via: Straw ?Medication Administration: Whole meds with liquid ?Supervision: Intermittent supervision to cue for compensatory strategies;Patient able to self feed ?Compensations: Slow rate;Small sips/bites;Lingual sweep for clearance of pocketing ?Postural Changes: Seated upright at 90 degrees;Remain upright for at least 30 minutes after po intake  ?  ?Other  Recommendations Oral Care Recommendations: Oral care BID   ? ?Recommendations for follow up therapy are one component of a multi-disciplinary discharge planning process, led by the attending physician.  Recommendations may be updated based on patient status, additional functional criteria and insurance authorization. ? ?Follow up Recommendations Other (comment) (TBD)  ? ? ?  ?Assistance Recommended at Discharge Other (comment) (TBD)  ?Functional Status Assessment Patient has  had a recent decline in their functional status and demonstrates the ability to make significant improvements in function in a reasonable and predictable  amount of time.  ?Frequency and Duration min 1 x/week  ?2 weeks ?  ?   ? ?Prognosis Prognosis for Safe Diet Advancement: Good  ? ?  ? ?Swallow Study   ?General Date of Onset: 03/15/22 ?HPI: 66yo male admitted 03/15/22 with n/v, left weakness, facial droop and slurred speech. CTHead = acute (sub)cortical R MCA territory infarct within the mid-posterior right frontal lobe. ?Type of Study: Bedside Swallow Evaluation ?Previous Swallow Assessment: none found ?Diet Prior to this Study: NPO ?Temperature Spikes Noted: No ?Respiratory Status: Room air ?History of Recent Intubation: No ?Behavior/Cognition: Alert;Cooperative;Pleasant mood ?Oral Cavity Assessment: Within Functional Limits ?Oral Care Completed by SLP: Yes ?Oral Cavity - Dentition: Missing dentition ?Vision: Functional for self-feeding ?Self-Feeding Abilities: Able to feed self ?Patient Positioning: Upright in bed ?Baseline Vocal Quality: Normal;Other (comment) (gravelly) ?Volitional Cough: Strong ?Volitional Swallow: Able to elicit  ?  ?Oral/Motor/Sensory Function Overall Oral Motor/Sensory Function: Moderate impairment ?Facial ROM: Reduced left ?Facial Symmetry: Abnormal symmetry left ?Facial Strength: Reduced left ?Facial Sensation: Within Functional Limits ?Lingual ROM: Other (Comment) (generalized weakness) ?Lingual Symmetry: Within Functional Limits ?Lingual Strength: Reduced ?Lingual Sensation: Within Functional Limits ?Velum: Within Functional Limits ?Mandible: Within Functional Limits   ?Ice Chips Ice chips: Impaired ?Presentation: Cup;Spoon ?Oral Phase Impairments: Reduced lingual movement/coordination ?Oral Phase Functional Implications: Left anterior spillage   ?Thin Liquid Thin Liquid: Within functional limits ?Presentation: Straw;Self Fed  ?  ?Nectar Thick Nectar Thick Liquid: Not tested   ?Honey Thick Honey Thick Liquid: Not tested   ?Puree Puree: Impaired ?Oral Phase Impairments: Poor awareness of bolus ?Oral Phase Functional Implications: Left  anterior spillage   ?Solid ? ? ?  Solid: Impaired ?Oral Phase Impairments: Impaired mastication  ? ?  ?Farran Amsden B. Ayauna Mcnay, MSP, CCC-SLP ?Speech Language Pathologist ?Office: [phone] ? ?Shonna Chock ?03/15/2022,1:33 PM ? ? ? ? ?

## 2022-03-15 NOTE — ED Notes (Addendum)
Pt ambulatory to the restroom with standby assistance as needed. Pt does report that he noticed as he is watching TV he has a small spot in his visual field that is blurred.  ?

## 2022-03-15 NOTE — H&P (Signed)
?History and Physical  ? ? ?Gary Frederick FVC:[id number] DOB: [birth year] DOA: 03/15/2022 ? ?PCP: [name], MD ?Patient coming from: home ? ?Chief Complaint: Left facial droop, slurred speech and left arm numbness and tingling ? ?HPI: Gary Frederick is a 65 y.o. male with medical history significant of alcohol misuse, tobacco abuse, T2DM, HTN, HLD, who presented with left facial droop, slurred speech and left arm numbness tingling. ? ?Patient reports intermittent alcohol use and tobacco abuse 1 pack a day.  He admits drinking liquor 2 drinks along with 3-4 bottles of beer last night. He went to bed around 11 pm last night. He believes that this was his last normal. He woke up around 4 am but is unsure whether he had any symptoms at that time or not. Around 9 am this morning, he woke up and found left-sided facial droop and slurred speech.  Patient also reports left arm numbness tingling.  He thinks he may have had transient left arm weakness but he is not sure.  Denies history of stroke.  Patient does have family history of stroke with his mother.  He reports that his father along with 5 of his brothers died from [place] disease.  ? ?Patient continues ED for further evaluation.  In the emergency room, he was afebrile with a pulse 89, RR 16, BP 114/77 and room air O2 sats 94%.  The labs showed WBC 11.1 creatinine 1.66, glucose 120s, nonrevealing UA, alcohol level 52, UDS positive for Tetrahydrocannabinol. Head CT and CTA head and neck are as below.  Code stroke was called.  Neurology was consulted.  Patient received aspirin 325 mg p.o. x1 at the ED ? ?Head CT showed "Acute cortical/subcortical right MCA territory infarct within the mid-to-posterior right frontal lobe. ASPECTS is 8. ?Additional small acute cortical/subcortical infarct within the right ?occipital lobe (right PCA vascular territory). ? A subcentimeter infarct within the inferior left cerebellar ?hemisphere is new from the prior head CT of  02/17/2014, but ?otherwise age-indeterminate ? ?CTA Head and neck showed Moderate calcific stenosis of the supraclinoid internal carotid ?artery bilaterally ? ?Review of Systems: As per HPI otherwise 10 point review of systems negative.  ?Review of Systems ?Otherwise negative except as per HPI, including: ?General: Denies fever, chills, night sweats or unintended weight loss. ?Resp: Denies cough, wheezing, shortness of breath. ?Cardiac: Denies chest pain, palpitations, orthopnea, paroxysmal nocturnal dyspnea. ?GI: Denies abdominal pain, nausea, vomiting, diarrhea or constipation ?GU: Denies dysuria, frequency, hesitancy or incontinence ?MS: Denies muscle aches, joint pain or swelling ?Neuro: Positive for left facial droop, slurred speech, left arm numbness tingling with possible transient weakness ?Psych: Denies anxiety, depression, SI/HI/AVH ?Skin: Denies new rashes or lesions ?ID: Denies sick contacts, exotic exposures, travel ? ?Past Medical History:  ?Diagnosis Date  ? Arthritis   ? Cancer ([place]) 10/2012  ? colon  ? Constipation   ? Diabetes mellitus without complication ([place])   ? Gout   ? Hyperlipidemia   ? Hypertension   ? Insomnia   ? ? ?Past Surgical History:  ?Procedure Laterality Date  ? CARPAL TUNNEL RELEASE    ? right  ? COLON RESECTION  12/11/2012  ? Procedure: LAPAROSCOPIC RIGHT COLON RESECTION;  Surgeon: Leighton Ruff, MD;  Location: [facility];  Service: General;  Laterality: Right;  ? COLON SURGERY  12/11/2012  ? LIVER ULTRASOUND  12/11/2012  ? Procedure: LIVER ULTRASOUND;  Surgeon: Leighton Ruff, MD;  Location: [facility];  Service: General;  Laterality: Right;  Intraoperative Liver Ultrasound  ? ? ?  SOCIAL HISTORY: ? reports that he has been smoking cigarettes. He has a 30.00 pack-year smoking history. He has never used smokeless tobacco. He reports current alcohol use of about 5.0 standard drinks per week. He reports that he does not use drugs. ? ?No Known Allergies ? ?FAMILY HISTORY: ?Family History  ?Problem  Relation Age of Onset  ? Diabetes Mother   ? Stroke Mother   ? Huntington's disease Father   ? Hypertension Brother   ? ? ? ?Prior to Admission medications   ?Medication Sig Start Date End Date Taking? Authorizing Provider  ?acetaminophen (TYLENOL) 500 MG tablet Take 500 mg by mouth every 6 (six) hours as needed.   Yes [provider]  ?allopurinol (ZYLOPRIM) 100 MG tablet Take 100 mg by mouth daily. 09/29/21  Yes [provider]  ?amLODipine (NORVASC) 10 MG tablet Take 10 mg by mouth daily with lunch.    Yes [provider]  ?aspirin 81 MG chewable tablet Chew 81 mg by mouth 2 (two) times daily. 01/25/22  Yes [provider]  ?lisinopril (PRINIVIL,ZESTRIL) 40 MG tablet Take 40 mg by mouth daily with lunch.    Yes [provider]  ?metoprolol succinate (TOPROL-XL) 50 MG 24 hr tablet Take 50 mg by mouth daily.  06/08/13  Yes [provider]  ?omega-3 acid ethyl esters (LOVAZA) 1 G capsule Take 2 g by mouth daily.  05/28/13  Yes [provider]  ?OZEMPIC, 0.25 OR 0.5 MG/DOSE, 2 MG/1.5ML SOPN Inject 0.25 mg into the skin once a week. Tuesdays 09/22/21  Yes [provider]  ?Polyvinyl Alcohol-Povidone (REFRESH OP) Place 1 drop into both eyes 2 (two) times daily as needed (dry eye).   Yes [provider]  ?tadalafil (CIALIS) 20 MG tablet Take 20 mg by mouth See admin instructions. Every 3 days as needed for erectile dysfunction 11/17/21  Yes [provider]  ?apixaban (ELIQUIS) 5 MG TABS tablet Take 5 mg by mouth 2 (two) times daily. ?Patient not taking: Reported on 02/06/2022    [provider]  ?ONE TOUCH ULTRA TEST test strip USE 1 STRIP TO [place] DAILY 12/31/18   [provider]  ? ? ?Physical Exam: ?Vitals:  ? 03/15/22 1330 03/15/22 1400 03/15/22 1430 03/15/22 1500  ?BP: 129/83 129/89 (!) 133/93 (!) 137/92  ?Pulse: 74 80 74 87  ?Resp: '17 17 13 '$ (!) 21  ?Temp:      ?SpO2: 98% 97% 99% 95%   ? ? ? ? ?Constitutional: NAD, calm, comfortable.  Chronic ill-appearing. ?Eyes: PERRL, lids and conjunctivae normal ?ENMT: Mucous membranes are moist. Posterior pharynx clear of any exudate or lesions.Normal dentition.  ?Neck: normal, supple, no masses, no thyromegaly ?Respiratory: clear to auscultation bilaterally, no wheezing, no crackles. Normal respiratory effort. No accessory muscle use.  ?Cardiovascular: Regular rate and rhythm, no murmurs / rubs / gallops. No extremity edema. 2+ pedal pulses. No carotid bruits.  ?Abdomen: no tenderness, no masses palpated. No hepatosplenomegaly. Bowel sounds positive.  ?Musculoskeletal: no clubbing / cyanosis. No joint deformity upper and lower extremities. Good ROM, no contractures. Normal muscle tone.  ?Skin: no rashes, lesions, ulcers. No induration ?Neurologic: CN 2-12 grossly intact except for left facial droop and slurred speech.  Sensation intact, DTR normal. Strength 5/5 in all 4.  ?Psychiatric: Normal judgment and insight. Alert and oriented x 3. Normal mood.  ? ? ? ?Labs on Admission: I have personally reviewed following labs and imaging studies ? ?CBC: ?Recent Labs  ?Lab 03/15/22 ?  1110 03/15/22 ?1121  ?WBC 11.1*  --   ?NEUTROABS 8.2*  --   ?HGB 15.8 16.3  ?HCT 46.2 48.0  ?MCV 90.8  --   ?PLT 214  --   ? ?Basic Metabolic Panel: ?Recent Labs  ?Lab 03/15/22 ?1110 03/15/22 ?1121  ?Semaj Coburn 138 138  ?K 3.7 3.7  ?CL 102 102  ?CO2 23  --   ?GLUCOSE 129* 127*  ?BUN 11 13  ?CREATININE 1.66* 1.80*  ?CALCIUM 9.2  --   ? ?GFR: ?CrCl cannot be calculated (Unknown ideal weight.). ?Liver Function Tests: ?Recent Labs  ?Lab 03/15/22 ?1110  ?AST 31  ?ALT 34  ?ALKPHOS 43  ?BILITOT 0.7  ?PROT 7.3  ?ALBUMIN 4.0  ? ?No results for input(s): LIPASE, AMYLASE in the last 168 hours. ?No results for input(s): AMMONIA in the last 168 hours. ?Coagulation Profile: ?Recent Labs  ?Lab 03/15/22 ?1110  ?INR 1.0  ? ?Cardiac Enzymes: ?No results for input(s): CKTOTAL, CKMB, CKMBINDEX, TROPONINI in the  last 168 hours. ?BNP (last 3 results) ?No results for input(s): PROBNP in the last 8760 hours. ?HbA1C: ?No results for input(s): HGBA1C in the last 72 hours. ?CBG: ?Recent Labs  ?Lab 03/15/22 ?1114  ?GLUCAP 136*  ? ?Lip

## 2022-03-15 NOTE — ED Notes (Signed)
SLP at bedside and cleared pt for PO medicaitons.  ?

## 2022-03-15 NOTE — Code Documentation (Signed)
Stroke Response Nurse Documentation ?Code Documentation ? ?Gary Frederick is a 66 y.o. male arriving to [facility]  via [facility] on 03/15/22 with past medical hx of DM, HLD, HTN, smoker, colon CA. On aspirin 81 mg daily. Code stroke was activated by ED.  ? ?Patient from home where he was LKW at 0200 when he went to bed in his normal state. Patient woke up at 0430 and noticed at left sided facial droop.  Around 0930 patient vomited x1.  Patient also noticed his speech was slurred. His wife drove him to the ED.  ? ?Patient seen in triage and Code Stroke activated. Stroke team at patient bedside on activation, met patient in CT. NIHSS 2, see documentation for details and code stroke times. Patient with left facial droop and dysarthria  on exam.  ? ?The following imaging was completed:  CT Head and CTA. Patient is not a candidate for IV Thrombolytic due to outside window. Patient is not not a candidate for IR due to no LVO.  ? ?Care Plan: q2h mNIHSS and vitals, SBP <210.  ? ?Bedside handoff with ED RN Quita Skye.   ? ?Gary Frederick  ?Stroke Response RN ? ? ?

## 2022-03-15 NOTE — ED Provider Notes (Signed)
?[facility] ?Provider Note ? ? ?CSN: [id number] ?Arrival date & time: 03/15/22  1101 ? ?  ? ?History ? ?Chief Complaint  ?Patient presents with  ? Code Stroke  ? ? ?Gary Frederick is a 66 y.o. male. ? ?Patient is a 66 year old male with past medical history of hypertension, hyperlipidemia, and diabetes presenting for complaints of strokelike symptoms.  Wife at bedside states that she found patient with left-sided facial weakness and slurred speech at 9 AM this morning.  Patient's last known normal is unknown.  States he went to bed at 10 PM last night without any symptoms.  He awoke around 4 to 5 AM is unsure if he had symptoms at that time or not.  Also admitted to left-sided station changes in his left upper extremity that is now resolved.  Denies previous stroke.  Denies blood thinner use. ? ?The history is provided by the patient and the spouse. No language interpreter was used.  ? ?  ? ?Home Medications ?Prior to Admission medications   ?Medication Sig Start Date End Date Taking? Authorizing Provider  ?acetaminophen (TYLENOL) 500 MG tablet Take 500 mg by mouth every 6 (six) hours as needed. ?Patient not taking: Reported on 02/06/2022    [provider]  ?allopurinol (ZYLOPRIM) 100 MG tablet Take 100 mg by mouth daily. 09/29/21   [provider]  ?amLODipine (NORVASC) 10 MG tablet Take 10 mg by mouth daily with lunch.     [provider]  ?apixaban (ELIQUIS) 5 MG TABS tablet Take 5 mg by mouth 2 (two) times daily. ?Patient not taking: Reported on 02/06/2022    [provider]  ?aspirin 81 MG chewable tablet Chew 81 mg by mouth 2 (two) times daily. 01/25/22   [provider]  ?celecoxib (CELEBREX) 200 MG capsule Take 200 mg by mouth 2 (two) times daily as needed for pain. 01/25/22   [provider]  ?HYDROcodone-acetaminophen (NORCO/VICODIN) 5-325 MG tablet Take 1 tablet by mouth every 6 (six) hours as needed. 01/25/22    [provider]  ?lisinopril (PRINIVIL,ZESTRIL) 40 MG tablet Take 40 mg by mouth daily with lunch.     [provider]  ?metoprolol succinate (TOPROL-XL) 50 MG 24 hr tablet Take 50 mg by mouth daily.  06/08/13   [provider]  ?omega-3 acid ethyl esters (LOVAZA) 1 G capsule Take 2 g by mouth daily.  05/28/13   [provider]  ?ONE TOUCH ULTRA TEST test strip USE 1 STRIP TO [place] DAILY 12/31/18   [provider]  ?OZEMPIC, 0.25 OR 0.5 MG/DOSE, 2 MG/1.5ML SOPN Inject 0.25 mg into the skin once a week. 09/22/21   [provider]  ?tadalafil (CIALIS) 20 MG tablet Take 20 mg by mouth See admin instructions. Every 3 days as needed for erectile dysfunction 11/17/21   [provider]  ?   ? ?Allergies    ?Patient has no known allergies.   ? ?Review of Systems   ?Review of Systems  ?Constitutional:  Negative for chills and fever.  ?HENT:  Negative for ear pain and sore throat.   ?Eyes:  Negative for pain and visual disturbance.  ?Respiratory:  Negative for cough and shortness of breath.   ?Cardiovascular:  Negative for chest pain and palpitations.  ?Gastrointestinal:  Negative for abdominal pain and vomiting.  ?Genitourinary:  Negative for dysuria and hematuria.  ?Musculoskeletal:  Negative for arthralgias and back pain.  ?Skin:  Negative for color  change and rash.  ?Neurological:  Positive for facial asymmetry, speech difficulty and numbness. Negative for seizures and syncope.  ?All other systems reviewed and are negative. ? ?Physical Exam ?Updated Vital Signs ?BP 134/82 (BP Location: Right Arm)   Pulse 91   Temp 98.8 ?F (37.1 ?C)   Resp 20   SpO2 94%  ?Physical Exam ?Vitals and nursing note reviewed.  ?Constitutional:   ?   General: He is not in acute distress. ?   Appearance: He is well-developed.  ?HENT:  ?   Head: Normocephalic and atraumatic.  ?Eyes:  ?   Conjunctiva/sclera: Conjunctivae normal.  ?Cardiovascular:  ?   Rate and Rhythm:  Normal rate and regular rhythm.  ?   Heart sounds: No murmur heard. ?Pulmonary:  ?   Effort: Pulmonary effort is normal. No respiratory distress.  ?   Breath sounds: Normal breath sounds.  ?Abdominal:  ?   Palpations: Abdomen is soft.  ?   Tenderness: There is no abdominal tenderness.  ?Musculoskeletal:     ?   General: No swelling.  ?   Cervical back: Neck supple.  ?Skin: ?   General: Skin is warm and dry.  ?   Capillary Refill: Capillary refill takes less than 2 seconds.  ?Neurological:  ?   Mental Status: He is alert and oriented to person, place, and time.  ?   GCS: GCS eye subscore is 4. GCS verbal subscore is 5. GCS motor subscore is 6.  ?   Cranial Nerves: Dysarthria and facial asymmetry present.  ?   Motor: Motor function is intact.  ?Psychiatric:     ?   Mood and Affect: Mood normal.  ? ? ?ED Results / Procedures / Treatments   ?Labs ?(all labs ordered are listed, but only abnormal results are displayed) ?Labs Reviewed  ?CBC - Abnormal; Notable for the following components:  ?    Result Value  ? WBC 11.1 (*)   ? All other components within normal limits  ?DIFFERENTIAL - Abnormal; Notable for the following components:  ? Neutro Abs 8.2 (*)   ? Monocytes Absolute 1.1 (*)   ? All other components within normal limits  ?COMPREHENSIVE METABOLIC PANEL - Abnormal; Notable for the following components:  ? Glucose, Bld 129 (*)   ? Creatinine, Ser 1.66 (*)   ? GFR, Estimated 46 (*)   ? All other components within normal limits  ?I-STAT CHEM 8, ED - Abnormal; Notable for the following components:  ? Creatinine, Ser 1.80 (*)   ? Glucose, Bld 127 (*)   ? Calcium, Ion 1.00 (*)   ? All other components within normal limits  ?CBG MONITORING, ED - Abnormal; Notable for the following components:  ? Glucose-Capillary 136 (*)   ? All other components within normal limits  ?RESP PANEL BY RT-PCR (FLU A&B, COVID) ARPGX2  ?PROTIME-INR  ?APTT  ?ETHANOL  ?RAPID URINE DRUG SCREEN, HOSP PERFORMED  ?URINALYSIS, ROUTINE W REFLEX  MICROSCOPIC  ? ? ?EKG ?None ? ?Radiology ?CT ANGIO HEAD NECK W WO CM ? ?Result Date: 03/15/2022 ?CLINICAL DATA:  Acute neuro deficit.  Stroke EXAM: CT ANGIOGRAPHY HEAD AND NECK TECHNIQUE: Multidetector CT imaging of the head and neck was performed using the standard protocol during bolus administration of intravenous contrast. Multiplanar CT image reconstructions and MIPs were obtained to evaluate the vascular anatomy. Carotid stenosis measurements (when applicable) are obtained utilizing NASCET criteria, using the distal internal carotid diameter as the denominator. RADIATION DOSE REDUCTION: This exam was  performed according to the departmental dose-optimization program which includes automated exposure control, adjustment of the mA and/or kV according to patient size and/or use of iterative reconstruction technique. CONTRAST:  15m OMNIPAQUE IOHEXOL 350 MG/ML SOLN COMPARISON:  CT head 03/15/2022 FINDINGS: CTA NECK FINDINGS Aortic arch: Mild atherosclerotic calcification in the aortic arch. Bovine branching arch. Proximal great vessels widely patent. Right carotid system: Right carotid widely patent without stenosis. Mild atherosclerotic calcification right carotid bulb. Negative for dissection. Left carotid system: Left carotid widely patent. Negative for stenosis or dissection. Mild noncalcified plaque left carotid bulb. Vertebral arteries: Both vertebral arteries are widely patent and normal to the skull base. Skeleton: Mild degenerative change cervical spine. No acute skeletal abnormality. Poor dentition. Other neck: Negative for mass or adenopathy in the neck. Upper chest: Lung apices clear bilaterally. Calcified lymph nodes in the hila bilaterally and in the right precarinal region. Precarinal lymph node 16 mm in diameter. Review of the MIP images confirms the above findings CTA HEAD FINDINGS Anterior circulation: Atherosclerotic calcification in the cavernous carotid bilaterally. Moderate stenosis of the  supraclinoid internal carotid artery bilaterally due to calcified plaque. Anterior and middle cerebral arteries are patent. No large vessel occlusion or significant stenosis. No vascular malformation Posterior circulation:

## 2022-03-15 NOTE — ED Provider Triage Note (Signed)
Emergency Medicine Provider Triage Evaluation Note ? ?Gary Frederick , a 66 y.o. male  was evaluated in triage.  Pt complains of stroke sxs. ? ?Review of Systems  ?Positive: Slurred speech, L facial droop, L arm weakness ?Negative: Headache, cp, sob ? ?Physical Exam  ?BP 114/77 (BP Location: Right Arm)   Pulse 89   Temp 98.8 ?F (37.1 ?C)   Resp 16   SpO2 94%  ?Gen:   Awake, no distress ?Resp:  Normal effort  ?MSK:   Moves extremities without difficulty  ?Other:  L facial droop, slurred speech, L arm drift with decreased grip strength ? ?Medical Decision Making  ?Medically screening exam initiated at 11:15 AM.  Appropriate orders placed.  Gary Frederick was informed that the remainder of the evaluation will be completed by another provider, this initial triage assessment does not replace that evaluation, and the importance of remaining in the ED until their evaluation is complete. ? ?Pt here with stroke sxs.  Last known normal either last night or 4am this morning.  Pt is VAN positive, code stroke activated.  Care discussed with DR. Pearline Cables.  ? ?.Critical Care ?Performed by: Domenic Moras, PA-C ?Authorized by: Domenic Moras, PA-C  ? ?Critical care provider statement:  ?  Critical care time (minutes):  30 ?  Critical care was time spent personally by me on the following activities:  Development of treatment plan with patient or surrogate, discussions with consultants, evaluation of patient's response to treatment, examination of patient, ordering and review of laboratory studies, ordering and review of radiographic studies, ordering and performing treatments and interventions, pulse oximetry, re-evaluation of patient's condition and review of old charts ? ?  ?Domenic Moras, PA-C ?03/15/22 1118 ? ?

## 2022-03-15 NOTE — ED Notes (Signed)
Pt to ED room 12 from CT at this time. Neuro team at bedside.  ?

## 2022-03-16 ENCOUNTER — Inpatient Hospital Stay (HOSPITAL_COMMUNITY): Payer: Medicare Other

## 2022-03-16 ENCOUNTER — Telehealth: Payer: Self-pay | Admitting: Student

## 2022-03-16 DIAGNOSIS — I6389 Other cerebral infarction: Secondary | ICD-10-CM | POA: Diagnosis not present

## 2022-03-16 DIAGNOSIS — I480 Paroxysmal atrial fibrillation: Secondary | ICD-10-CM

## 2022-03-16 DIAGNOSIS — I63411 Cerebral infarction due to embolism of right middle cerebral artery: Secondary | ICD-10-CM | POA: Diagnosis not present

## 2022-03-16 DIAGNOSIS — E119 Type 2 diabetes mellitus without complications: Secondary | ICD-10-CM

## 2022-03-16 DIAGNOSIS — E785 Hyperlipidemia, unspecified: Secondary | ICD-10-CM

## 2022-03-16 LAB — BASIC METABOLIC PANEL
Anion gap: 8 (ref 5–15)
Anion gap: 7 (ref 5–15)
BUN: 12 mg/dL (ref 8–23)
BUN: 9 mg/dL (ref 8–23)
CO2: 24 mmol/L (ref 22–32)
CO2: 26 mmol/L (ref 22–32)
Calcium: 8.8 mg/dL — ABNORMAL LOW (ref 8.9–10.3)
Calcium: 8.8 mg/dL — ABNORMAL LOW (ref 8.9–10.3)
Chloride: 103 mmol/L (ref 98–111)
Chloride: 105 mmol/L (ref 98–111)
Creatinine, Ser: 0.95 mg/dL (ref 0.61–1.24)
Creatinine, Ser: 0.96 mg/dL (ref 0.61–1.24)
GFR, Estimated: >60 mL/min (ref >60)
GFR, Estimated: >60 mL/min (ref >60)
Glucose, Bld: 119 mg/dL — ABNORMAL HIGH (ref 70–99)
Glucose, Bld: 130 mg/dL — ABNORMAL HIGH (ref 70–99)
Potassium: 3.1 mmol/L — ABNORMAL LOW (ref 3.5–5.1)
Potassium: 3.6 mmol/L (ref 3.5–5.1)
Sodium: 135 mmol/L (ref 135–145)
Sodium: 138 mmol/L (ref 135–145)

## 2022-03-16 LAB — CBC
HCT: 41.8 % (ref 39.0–52.0)
Hemoglobin: 14.6 g/dL (ref 13.0–17.0)
MCH: 31.1 pg (ref 26.0–34.0)
MCHC: 34.9 g/dL (ref 30.0–36.0)
MCV: 89.1 fL (ref 80.0–100.0)
Platelets: 194 10*3/uL (ref 150–400)
RBC: 4.69 MIL/uL (ref 4.22–5.81)
RDW: 12.9 % (ref 11.5–15.5)
WBC: 9.6 10*3/uL (ref 4.0–10.5)
nRBC: 0 % (ref 0.0–0.2)

## 2022-03-16 LAB — GLUCOSE, CAPILLARY
Glucose-Capillary: 103 mg/dL — ABNORMAL HIGH (ref 70–99)
Glucose-Capillary: 110 mg/dL — ABNORMAL HIGH (ref 70–99)
Glucose-Capillary: 126 mg/dL — ABNORMAL HIGH (ref 70–99)
Glucose-Capillary: 128 mg/dL — ABNORMAL HIGH (ref 70–99)
Glucose-Capillary: 135 mg/dL — ABNORMAL HIGH (ref 70–99)

## 2022-03-16 LAB — ECHOCARDIOGRAM COMPLETE
S' Lateral: 2.5 cm
Weight: 4042.35 oz

## 2022-03-16 LAB — HIV ANTIBODY (ROUTINE TESTING W REFLEX): HIV Screen 4th Generation wRfx: NONREACTIVE

## 2022-03-16 LAB — LIPID PANEL
Cholesterol: 181 mg/dL (ref 0–200)
HDL: 28 mg/dL — ABNORMAL LOW (ref >40)
LDL Cholesterol: 115 mg/dL — ABNORMAL HIGH (ref 0–99)
Total CHOL/HDL Ratio: 6.5 RATIO
Triglycerides: 189 mg/dL — ABNORMAL HIGH (ref <150)
VLDL: 38 mg/dL (ref 0–40)

## 2022-03-16 LAB — MAGNESIUM: Magnesium: 2.2 mg/dL (ref 1.7–2.4)

## 2022-03-16 LAB — HEMOGLOBIN A1C
Hgb A1c MFr Bld: 6.6 % — ABNORMAL HIGH (ref 4.8–5.6)
Mean Plasma Glucose: 142.72 mg/dL

## 2022-03-16 LAB — TSH: TSH: 1.218 u[IU]/mL (ref 0.350–4.500)

## 2022-03-16 MED ORDER — METOPROLOL SUCCINATE ER 50 MG PO TB24
50.0000 mg | ORAL_TABLET | Freq: Every day | ORAL | Status: DC
  Administered 2022-03-16 – 2022-03-17 (×2): 50 mg via ORAL
  Filled 2022-03-16 (×2): qty 1

## 2022-03-16 MED ORDER — INSULIN ASPART 100 UNIT/ML IJ SOLN
0.0000 [IU] | Freq: Three times a day (TID) | INTRAMUSCULAR | Status: DC
  Administered 2022-03-17: 2 [IU] via SUBCUTANEOUS

## 2022-03-16 MED ORDER — ATORVASTATIN CALCIUM 40 MG PO TABS
40.0000 mg | ORAL_TABLET | Freq: Every day | ORAL | Status: DC
  Administered 2022-03-16 – 2022-03-17 (×2): 40 mg via ORAL
  Filled 2022-03-16 (×2): qty 1

## 2022-03-16 MED ORDER — APIXABAN 5 MG PO TABS
5.0000 mg | ORAL_TABLET | Freq: Two times a day (BID) | ORAL | Status: DC
  Administered 2022-03-16 – 2022-03-17 (×2): 5 mg via ORAL
  Filled 2022-03-16 (×3): qty 1

## 2022-03-16 MED ORDER — OMEGA-3-ACID ETHYL ESTERS 1 G PO CAPS
2.0000 g | ORAL_CAPSULE | Freq: Every day | ORAL | Status: DC
  Administered 2022-03-16 – 2022-03-17 (×2): 2 g via ORAL
  Filled 2022-03-16 (×2): qty 2

## 2022-03-16 NOTE — TOC CAGE-AID Note (Signed)
Transition of Care (TOC) - CAGE-AID Screening ? ? ?Patient Details  ?Name: Gary Frederick ?MRN: [id number] ?Date of Birth: March 17, 1956 ? ?Transition of Care (TOC) CM/SW Contact:    ?Leng Montesdeoca C Tarpley-Carter, LCSWA ?Phone Number: ?03/16/2022, 12:33 PM ? ? ?Clinical Narrative: ?Pt participated in [facility].  Pt stated he does use substance and ETOH.  Pt was offered resources, due to usage of substance and ETOH.    ? ?Passenger transport manager, MSW, LCSW-A ?Pronouns:  She/Her/Hers ?[facility] of Care ?Clinical Social Worker ?Direct Number:  [phone] ?[email or web address] ? ?CAGE-AID Screening: ?  ? ?Have You Ever Felt You Ought to Cut Down on Your Drinking or Drug Use?: No ?Have People Annoyed You By Critizing Your Drinking Or Drug Use?: No ?Have You Felt Bad Or Guilty About Your Drinking Or Drug Use?: No ?Have You Ever Had a Drink or Used Drugs First Thing In The Morning to Steady Your Nerves or to Get Rid of a Hangover?: No ?CAGE-AID Score: 0 ? ?Substance Abuse Education Offered: Yes ? ?Substance abuse interventions: Educational Materials ? ? ? ? ? ? ?

## 2022-03-16 NOTE — Progress Notes (Signed)
Echocardiogram ?2D Echocardiogram has been performed. ? ?Oneal Deputy Leanna Hamid RDCS ?03/16/2022, 8:52 AM ?

## 2022-03-16 NOTE — Progress Notes (Signed)
?PROGRESS NOTE ? ? ? ?Gary Frederick  IEP:[id number] DOB: January 22, 1956 DOA: 03/15/2022 ?PCP: [name], MD  ? ? ?Chief Complaint  ?Patient presents with  ? Code Stroke  ? ? ?Brief Narrative:  ?Patient 66 year old gentleman history of alcohol use, tobacco use, type 2 diabetes, hypertension, hyperlipidemia presented with left facial droop, slurred speech, left arm tingling and numbness per admitting physician.  Head CT done concerning for acute CVA.  Neurology consulted.  Patient admitted for stroke work-up. ? ? ?Assessment & Plan: ?  ?Principal Problem: ?  CVA (cerebral vascular accident) ([facility]) ?Active Problems: ?  TOBACCO ABUSE ?  Essential hypertension ?  Alcohol abuse ?  Marijuana abuse ?  T2DM (type 2 diabetes mellitus) ([facility]) ?  ARF (acute renal failure) ([facility]) ?  Paroxysmal atrial fibrillation ([facility]) ?  Hyperlipidemia ? ? ?#1 acute right MCA branch, PCA and cerebellar infarcts likely secondary to embolism in the setting of A-fib not on anticoagulation ?-Patient had presented with left facial droop, dysarthric/slurred speech, left upper extremity tingling per patient.  Patient denied any left upper extremity weakness. ?-CT head done concerning for acute cortical/subcortical infarct in the right MCA territory and occipital lobes. ?-CT angiogram head and neck done negative for LVO, moderate calcific stenosis of bilateral supraclinoid carotids. ?-MRI brain done with acute infarcts in the right MCA, PCA and right cerebellar artery territories with MCA and PCA territorial petechial hemorrhage. ?-2D echo done with normal EF, no wall motion abnormalities, no source of emboli noted, normal atrial septum. ?-Fasting lipid panel with LDL of 115.  Triglyceride level of 189. ?-Hemoglobin A1c 6.6. ?-Patient admitted and placed on aspirin 81 mg daily. ?-It is noted per neurology that patient had an event monitor which noted A-fib and was to be on Eliquis however patient noncompliant with Eliquis. ?-Patient started on a  statin ?-Neurology recommending resumption of anticoagulation with Eliquis and as such we will discontinue aspirin and Lovenox. ?-Patient currently on a dysphagia 2 diet. ?-PT/OT/SLP. ?-Neurology following and appreciate input and recommendation. ? ?2.  Paroxysmal atrial fibrillation ?-Patient with history of atrial flutter/A-fib. ?-Patient noted to have been prescribed Eliquis December 2022 per [facility] cardiology however patient not taking Eliquis at home. ?-Patient now presented with acute CVA. ?-Resume home regimen Toprol-XL for rate control. ?-We will discontinue aspirin and start patient back on Eliquis 5 mg twice daily per neurology recommendations. ? ?3.  Hypertension ?-Permissive hypertension secondary to problem #1 ( BP < 220/120) ?-Gradually normalize BP in the next 5 to 7 days ?-Continue to hold antihypertensive medications for now. ?-Follow-up. ? ?4.  Hyperlipidemia ?-LDL of 115. ?-Goal LDL < 70 ?-Patient started on a statin. ?-Resume home regimen [place]. ?-Outpatient follow-up with PCP. ? ?5.  Well-controlled type 2 diabetes mellitus ?-Hemoglobin A1c 6.6. ?-Noted to be on Ozempic prior to admission. ?-CBG 128 this morning. ?-Place on SSI. ? ?6.  Tobacco abuse ?-Tobacco cessation. ?-Nicotine patch. ? ?7.  Alcohol use ?-Alcohol cessation. ?-No signs of withdrawal. ? ?8.  Acute kidney injury ?-Creatinine noted 1.66>>> 1.80 on admission. ?-Improving with hydration. ? ? ?DVT prophylaxis: Lovenox>>>> Eliquis ?Code Status: Full ?Family Communication: Updated patient and wife at bedside ?Disposition:  ? ?Status is: Inpatient ?Remains inpatient appropriate because: Severity of illness.  Work-up ongoing. ?  ?Consultants:  ?Neurology:Dr Alfonse Spruce 03/15/2022 ? ?Procedures:  ?CT angiogram head and neck 03/15/2022 ?CT head 03/15/2022 ?2D echo 03/16/2022 ?MRI brain 03/16/2022 ? ? ? ?Antimicrobials:  ?None ? ? ?Subjective: ?Laying in bed.  Still with some dysarthric speech  and left facial weakness.  States some improvement  with left upper extremity tingling.  No chest pain.  No shortness of breath.  Overall feeling a little bit better than on admission.  Wife at bedside ? ?Objective: ?Vitals:  ? 03/16/22 0400 03/16/22 0905 03/16/22 1301 03/16/22 1517  ?BP: (!) 136/98 (!) 160/93 (!) 138/101 (!) 155/99  ?Pulse:  81 71 78  ?Resp: '18 18 18 18  '$ ?Temp: 98 ?F (36.7 ?C) 99 ?F (37.2 ?C) 97.9 ?F (36.6 ?C) 97.7 ?F (36.5 ?C)  ?TempSrc:  Oral Oral Oral  ?SpO2: 99% 96% 96% 96%  ?Weight:      ? ? ?Intake/Output Summary (Last 24 hours) at 03/16/2022 1714 ?Last data filed at 03/16/2022 1615 ?Gross per 24 hour  ?Intake 1001.31 ml  ?Output --  ?Net 1001.31 ml  ? ?Filed Weights  ? 03/15/22 1130  ?Weight: 114.6 kg  ? ? ?Examination: ? ?General exam: Appears calm and comfortable  ?Respiratory system: Clear to auscultation. Respiratory effort normal. ?Cardiovascular system: S1 & S2 heard, RRR. No JVD, murmurs, rubs, gallops or clicks. No pedal edema. ?Gastrointestinal system: Abdomen is nondistended, soft and nontender. No organomegaly or masses felt. Normal bowel sounds heard. ?Central nervous system: Left facial droop.  Dysarthric speech.  5/5 bilateral upper extremity strength, 5/5 bilateral lower extremity strength.  ?Extremities: Symmetric 5 x 5 power. ?Skin: No rashes, lesions or ulcers ?Psychiatry: Judgement and insight appear normal. Mood & affect appropriate.  ? ? ? ?Data Reviewed: I have personally reviewed following labs and imaging studies ? ?CBC: ?Recent Labs  ?Lab 03/15/22 ?1110 03/15/22 ?1121 03/16/22 ?0024  ?WBC 11.1*  --  9.6  ?NEUTROABS 8.2*  --   --   ?HGB 15.8 16.3 14.6  ?HCT 46.2 48.0 41.8  ?MCV 90.8  --  89.1  ?PLT 214  --  194  ? ? ?Basic Metabolic Panel: ?Recent Labs  ?Lab 03/15/22 ?1110 03/15/22 ?1121 03/16/22 ?0024 03/16/22 ?0911  ?NA 138 138 135 138  ?K 3.7 3.7 3.1* 3.6  ?CL 102 102 103 105  ?CO2 23  --  24 26  ?GLUCOSE 129* 127* 119* 130*  ?BUN '11 13 12 9  '$ ?CREATININE 1.66* 1.80* 0.95 0.96  ?CALCIUM 9.2  --  8.8* 8.8*  ?MG  --   --    --  2.2  ? ? ?GFR: ?Estimated Creatinine Clearance: 106.1 mL/min (by C-G formula based on SCr of 0.96 mg/dL). ? ?Liver Function Tests: ?Recent Labs  ?Lab 03/15/22 ?1110  ?AST 31  ?ALT 34  ?ALKPHOS 43  ?BILITOT 0.7  ?PROT 7.3  ?ALBUMIN 4.0  ? ? ?CBG: ?Recent Labs  ?Lab 03/15/22 ?1114 03/16/22 ?0011 03/16/22 ?5638 03/16/22 ?1259 03/16/22 ?1608  ?GLUCAP 136* 126* 128* 135* 103*  ? ? ? ?No results found for this or any previous visit (from the past 240 hour(s)).  ? ? ? ? ? ?Radiology Studies: ?CT ANGIO HEAD NECK W WO CM ? ?Result Date: 03/15/2022 ?CLINICAL DATA:  Acute neuro deficit.  Stroke EXAM: CT ANGIOGRAPHY HEAD AND NECK TECHNIQUE: Multidetector CT imaging of the head and neck was performed using the standard protocol during bolus administration of intravenous contrast. Multiplanar CT image reconstructions and MIPs were obtained to evaluate the vascular anatomy. Carotid stenosis measurements (when applicable) are obtained utilizing NASCET criteria, using the distal internal carotid diameter as the denominator. RADIATION DOSE REDUCTION: This exam was performed according to the departmental dose-optimization program which includes automated exposure control, adjustment of the mA and/or kV according to  patient size and/or use of iterative reconstruction technique. CONTRAST:  79m OMNIPAQUE IOHEXOL 350 MG/ML SOLN COMPARISON:  CT head 03/15/2022 FINDINGS: CTA NECK FINDINGS Aortic arch: Mild atherosclerotic calcification in the aortic arch. Bovine branching arch. Proximal great vessels widely patent. Right carotid system: Right carotid widely patent without stenosis. Mild atherosclerotic calcification right carotid bulb. Negative for dissection. Left carotid system: Left carotid widely patent. Negative for stenosis or dissection. Mild noncalcified plaque left carotid bulb. Vertebral arteries: Both vertebral arteries are widely patent and normal to the skull base. Skeleton: Mild degenerative change cervical spine. No  acute skeletal abnormality. Poor dentition. Other neck: Negative for mass or adenopathy in the neck. Upper chest: Lung apices clear bilaterally. Calcified lymph nodes in the hila bilaterally and in the right pr

## 2022-03-16 NOTE — Progress Notes (Signed)
Speech Language Pathology Treatment: Dysphagia  ?Patient Details ?Name: Gary Frederick ?MRN: [id number] ?DOB: [birth year] ?Today's Date: 03/16/2022 ?Time: 0034-9179 ?SLP Time Calculation (min) (ACUTE ONLY): 16 min ? ?Assessment / Plan / Recommendation ?Clinical Impression ? Pt seen for reassessment of swallowing per new BSE order reporting "pt with occasional coughing with water". RN reports x2 coughing after med intake. Pt sitting at EOB with family present in room. Pt and family report that coughing with med intake was an isolated incidence. Pt further recalled that he had taken two large pills at once, with subsequent coughing. No other coughing noted with thin liquids during meals per their report. Consecutive sips of thin liquids in x3 trials was without overt s/sx of aspiration this date. Puree, simulated dys 2 and regular textures provided, with pt independently utilizing lingual sweep to clear min residuals in L buccal cavity. Educated regarding liquid wash as an additional strategy to improve oral clearance of POs. Recommend pt continue with current diet. He reports that large pills pose more difficulty to swallow and he prefers that his meds be administered whole in puree at this time. SLP will f/u for tolerance and readiness for diet advancement. Notified RN of need for SLE order given new changes in speech since CVA. ?  ?HPI HPI: 66yo male admitted 03/15/22 with n/v, left weakness, facial droop and slurred speech. CTHead = acute (sub)cortical R MCA territory infarct within the mid-posterior right frontal lobe. ?  ?   ?SLP Plan ? Continue with current plan of care ? ?  ?  ?Recommendations for follow up therapy are one component of a multi-disciplinary discharge planning process, led by the attending physician.  Recommendations may be updated based on patient status, additional functional criteria and insurance authorization. ?  ? ?Recommendations  ?Diet recommendations: Dysphagia 2 (fine chop);Thin  liquid ?Liquids provided via: Cup;Straw ?Medication Administration: Whole meds with puree (per pt request) ?Supervision: Patient able to self feed;Intermittent supervision to cue for compensatory strategies ?Compensations: Slow rate;Small sips/bites;Lingual sweep for clearance of pocketing;Follow solids with liquid ?Postural Changes and/or Swallow Maneuvers: Seated upright 90 degrees  ?   ?    ?   ? ? ? ? Oral Care Recommendations: Oral care BID ?Follow Up Recommendations: Other (comment) (TBD) ?Assistance recommended at discharge: Intermittent Supervision/Assistance ?SLP Visit Diagnosis: Dysphagia, unspecified (R13.10) ?Plan: Continue with current plan of care ? ? ? ? ?  ?  ? ?Gary Dense, MA, CCC-SLP ?Acute Rehabilitation Services ?Office Number: [phone] ? ?Gary Frederick ? ?03/16/2022, 2:04 PM ?

## 2022-03-16 NOTE — Evaluation (Addendum)
Physical Therapy Evaluation ?Patient Details ?Name: Gary Frederick ?MRN: [id number] ?DOB: Apr 29, 1956 ?Today's Date: 03/16/2022 ? ?History of Present Illness ? pt is a 66 y/o male presenting 4/13 with left facial droop, slurred speech and left arm numbness/tingling.  PMHx, alcohol misuse, tobacco abuse, DM2, HTN, HLD  ?Clinical Impression ? Pt is close to baseline functioning and should be safe at home with PRN assist of wife.  Pt and Wife were educated on Risk factors and s/s of stroke--BE FAST.  There are no further acute PT needs.  Will sign off at this time. ?   ?   ? ?Recommendations for follow up therapy are one component of a multi-disciplinary discharge planning process, led by the attending physician.  Recommendations may be updated based on patient status, additional functional criteria and insurance authorization. ? ?Follow Up Recommendations No PT follow up ? ?  ?Assistance Recommended at Discharge PRN  ?Patient can return home with the following ?   ? ?  ?Equipment Recommendations None recommended by PT  ?Recommendations for Other Services ?    ?  ?Functional Status Assessment    ? ?  ?Precautions / Restrictions Restrictions ?Weight Bearing Restrictions: No  ? ?  ? ?Mobility ? Bed Mobility ?Overal bed mobility: Modified Independent ?  ?  ?  ?  ?  ?  ?  ?  ? ?Transfers ?Overall transfer level: Needs assistance ?  ?Transfers: Sit to/from Stand ?Sit to Stand: Supervision ?  ?  ?  ?  ?  ?General transfer comment: no assist needed, used UE appropriately ?  ? ?Ambulation/Gait ?Ambulation/Gait assistance: Independent ?Gait Distance (Feet): 400 Feet ?Assistive device: None ?Gait Pattern/deviations: Step-through pattern ?  ?Gait velocity interpretation: >2.62 ft/sec, indicative of community ambulatory ?  ?General Gait Details: stiff, but steady gait with recent surgical R knee.  Otherwise no deviation from balance challenges. ? ?Stairs ?Stairs: Yes ?Stairs assistance: Modified independent (Device/Increase  time) ?Stair Management: One rail Right, Step to pattern, Forwards ?Number of Stairs: 4 ?General stair comments: safe with rail ? ?Wheelchair Mobility ?  ? ?Modified Rankin (Stroke Patients Only) ?Modified Rankin (Stroke Patients Only) ?Pre-Morbid Rankin Score: No symptoms ?Modified Rankin: Slight disability ? ?  ? ?Balance Overall balance assessment: Independent ?  ?  ?  ?  ?  ?  ?  ?  ?  ?  ?  ?  ?  ?  ?  ?Standardized Balance Assessment ?Standardized Balance Assessment : Berg Balance Test, Dynamic Gait Index ?Berg Balance Test ?Sit to Stand: Able to stand without using hands and stabilize independently ?Standing Unsupported: Able to stand safely 2 minutes ?Sitting with Back Unsupported but Feet Supported on Floor or Stool: Able to sit safely and securely 2 minutes ?Stand to Sit: Sits safely with minimal use of hands ?Transfers: Able to transfer safely, minor use of hands ?From Standing Position, Pick up Object from Floor: Able to pick up shoe, needs supervision ?Turn 360 Degrees: Able to turn 360 degrees safely in 4 seconds or less ?Dynamic Gait Index ?Level Surface: Normal ?Change in Gait Speed: Mild Impairment ?Gait with Horizontal Head Turns: Normal ?Gait with Vertical Head Turns: Normal ?Gait and Pivot Turn: Normal ?Step Over Obstacle: Normal ?Step Around Obstacles: Normal ?Steps: Mild Impairment ?Total Score: 22 ?   ? ? ? ?Pertinent Vitals/Pain Pain Assessment ?Pain Assessment: 0-10 ?Pain Score: 3  ?Pain Location: head ?Pain Descriptors / Indicators: Aching ?Pain Intervention(s): Monitored during session  ? ? ?Home Living Family/patient expects to be  discharged to:: Private residence ?Living Arrangements: Spouse/significant other ?Available Help at Discharge: Family;Available 24 hours/day ?Type of Home: House ?Home Access: Level entry ?  ?  ?  ?Home Layout: One level ?Home Equipment: Other (comment) (riser) ?   ?  ?Prior Function   ?  ?  ?  ?  ?  ?  ?  ?ADLs Comments: independent ?  ? ? ?Hand Dominance  ?    ? ?  ?Extremity/Trunk Assessment  ? Upper Extremity Assessment ?Upper Extremity Assessment: Defer to OT evaluation ?  ? ?Lower Extremity Assessment ?Lower Extremity Assessment: Overall WFL for tasks assessed ?  ? ?   ?Communication  ? Communication: Other (comment) (mumbled)  ?Cognition Arousal/Alertness: Awake/alert ?Behavior During Therapy: [facility] for tasks assessed/performed ?Overall Cognitive Status: Within Functional Limits for tasks assessed ?  ?  ?  ?  ?  ?  ?  ?  ?  ?  ?  ?  ?  ?  ?  ?  ?  ?  ?  ? ?  ?General Comments   ? ?  ?Exercises    ? ?Assessment/Plan  ?  ?PT Assessment Patient does not need any further PT services  ?PT Problem List   ? ?   ?  ?PT Treatment Interventions     ? ?PT Goals (Current goals can be found in the Care Plan section)  ?Acute Rehab PT Goals ?Patient Stated Goal: home ?PT Goal Formulation: All assessment and education complete, DC therapy ? ?  ?Frequency   ?  ? ? ?Co-evaluation   ?  ?  ?  ?  ? ? ?  ?AM-PAC PT "6 Clicks" Mobility  ?Outcome Measure Help needed turning from your back to your side while in a flat bed without using bedrails?: None ?Help needed moving from lying on your back to sitting on the side of a flat bed without using bedrails?: None ?Help needed moving to and from a bed to a chair (including a wheelchair)?: None ?Help needed standing up from a chair using your arms (e.g., wheelchair or bedside chair)?: None ?Help needed to walk in hospital room?: None ?Help needed climbing 3-5 steps with a railing? : A Little ?6 Click Score: 23 ? ?  ?End of Session   ?Activity Tolerance: Patient tolerated treatment well ?Patient left: in bed;with call bell/phone within reach ?Nurse Communication: Mobility status ?  ?  ? ?Time: 9833-8250 ?PT Time Calculation (min) (ACUTE ONLY): 26 min ? ? ?Charges:   PT Evaluation ?$PT Eval Low Complexity: 1 Low ?PT Treatments ?$Gait Training: 8-22 mins ?  ?   ? ? ?03/16/2022 ? ?Ginger Carne., PT ?Acute Rehabilitation Services ?[phone]   (pager) ?[phone]  (office) ? ?Gary Frederick ?03/16/2022, 11:25 AM ? ?

## 2022-03-16 NOTE — Evaluation (Signed)
Occupational Therapy Evaluation ?Patient Details ?Name: Gary Frederick ?MRN: [id number] ?DOB: 1956-02-05 ?Today's Date: 03/16/2022 ? ? ?History of Present Illness pt is a 66 y/o male presenting 4/13 with left facial droop, slurred speech and left arm numbness/tingling.  PMHx, alcohol misuse, tobacco abuse, DM2, HTN, HLD  ? ?Clinical Impression ?  ?Pt admitted for concerns listed above. PTA pt reported independence with all ADL's and IADL's, including driving and using no AD.  At this time, pt is near baseline, requiring min guard for safety as he is mildly weak and has balance deficits along with decreased safety awareness. Recommending HH at this time to maximize his independence and safety. OT will follow acutely.  ?   ? ?Recommendations for follow up therapy are one component of a multi-disciplinary discharge planning process, led by the attending physician.  Recommendations may be updated based on patient status, additional functional criteria and insurance authorization.  ? ?Follow Up Recommendations ? Home health OT  ?  ?Assistance Recommended at Discharge Set up Supervision/Assistance  ?Patient can return home with the following A little help with walking and/or transfers;A little help with bathing/dressing/bathroom;Assistance with cooking/housework ? ?  ?Functional Status Assessment ? Patient has had a recent decline in their functional status and demonstrates the ability to make significant improvements in function in a reasonable and predictable amount of time.  ?Equipment Recommendations ? Tub/shower seat  ?  ?Recommendations for Other Services   ? ? ?  ?Precautions / Restrictions Precautions ?Precautions: Fall ?Restrictions ?Weight Bearing Restrictions: No  ? ?  ? ?Mobility Bed Mobility ?Overal bed mobility: Modified Independent ?  ?  ?  ?  ?  ?  ?  ?  ? ?Transfers ?Overall transfer level: Needs assistance ?  ?Transfers: Sit to/from Stand ?Sit to Stand: Supervision ?  ?  ?  ?  ?  ?General transfer  comment: no assist needed, used UE appropriately ?  ? ?  ?Balance Overall balance assessment: Mild deficits observed, not formally tested ?  ?  ?  ?  ?  ?  ?  ?  ?  ?  ?  ?  ?  ?  ?  ?  ?  ?  ?   ? ?ADL either performed or assessed with clinical judgement  ? ?ADL Overall ADL's : Needs assistance/impaired ?  ?  ?  ?  ?  ?  ?  ?  ?  ?  ?  ?  ?  ?  ?  ?  ?  ?  ?  ?General ADL Comments: Min guard for all ADL's due to poor balance and safety awareness  ? ? ? ?Vision Baseline Vision/History: 0 No visual deficits ?Ability to See in Adequate Light: 0 Adequate ?Patient Visual Report: No change from baseline ?Vision Assessment?: No apparent visual deficits  ?   ?Perception   ?  ?Praxis   ?  ? ?Pertinent Vitals/Pain Pain Assessment ?Pain Assessment: No/denies pain  ? ? ? ?Hand Dominance Right ?  ?Extremity/Trunk Assessment Upper Extremity Assessment ?Upper Extremity Assessment: Overall WFL for tasks assessed ?  ?Lower Extremity Assessment ?Lower Extremity Assessment: Defer to PT evaluation ?  ?Cervical / Trunk Assessment ?Cervical / Trunk Assessment: Normal ?  ?Communication Communication ?Communication: Other (comment);Expressive difficulties (mumbled) ?  ?Cognition Arousal/Alertness: Awake/alert ?Behavior During Therapy: [facility] for tasks assessed/performed ?Overall Cognitive Status: Within Functional Limits for tasks assessed ?  ?  ?  ?  ?  ?  ?  ?  ?  ?  ?  ?  ?  ?  ?  ?  ?  ?  ?  ?  General Comments  VSS on RA ? ?  ?Exercises   ?  ?Shoulder Instructions    ? ? ?Home Living Family/patient expects to be discharged to:: Private residence ?Living Arrangements: Spouse/significant other ?Available Help at Discharge: Family;Available 24 hours/day ?Type of Home: House ?Home Access: Level entry ?  ?  ?Home Layout: One level ?  ?  ?Bathroom Shower/Tub: Walk-in shower ?  ?Bathroom Toilet: Standard ?Bathroom Accessibility: Yes ?  ?Home Equipment: Toilet riser ?  ?  ?  ? ?  ?Prior Functioning/Environment Prior Level of Function :  Independent/Modified Independent;Driving ?  ?  ?  ?  ?  ?  ?Mobility Comments: indep ?ADLs Comments: independent ?  ? ?  ?  ?OT Problem List: Decreased strength;Decreased activity tolerance;Impaired balance (sitting and/or standing);Decreased safety awareness ?  ?   ?OT Treatment/Interventions: Self-care/ADL training;Therapeutic exercise;Energy conservation;DME and/or AE instruction;Therapeutic activities;Patient/family education;Balance training  ?  ?OT Goals(Current goals can be found in the care plan section) Acute Rehab OT Goals ?Patient Stated Goal: To be able to swallow again ?OT Goal Formulation: With patient ?Time For Goal Achievement: 03/30/22 ?Potential to Achieve Goals: Good ?ADL Goals ?Pt Will Perform Grooming: with modified independence;standing ?Pt Will Perform Lower Body Bathing: with modified independence;sitting/lateral leans;sit to/from stand ?Pt Will Perform Lower Body Dressing: with modified independence;sitting/lateral leans;sit to/from stand ?Pt Will Transfer to Toilet: with modified independence;ambulating ?Pt Will Perform Toileting - Clothing Manipulation and hygiene: with modified independence;sitting/lateral leans;sit to/from stand  ?OT Frequency: Min 1X/week ?  ? ?Co-evaluation   ?  ?  ?  ?  ? ?  ?AM-PAC OT "6 Clicks" Daily Activity     ?Outcome Measure Help from another person eating meals?: None ?Help from another person taking care of personal grooming?: None ?Help from another person toileting, which includes using toliet, bedpan, or urinal?: A Little ?Help from another person bathing (including washing, rinsing, drying)?: A Little ?Help from another person to put on and taking off regular upper body clothing?: None ?Help from another person to put on and taking off regular lower body clothing?: A Little ?6 Click Score: 21 ?  ?End of Session Nurse Communication: Mobility status ? ?Activity Tolerance: Patient tolerated treatment well ?Patient left: in chair;with call bell/phone within  reach ? ?OT Visit Diagnosis: Unsteadiness on feet (R26.81);Other abnormalities of gait and mobility (R26.89);Muscle weakness (generalized) (M62.81)  ?              ?Time: 7353-2992 ?OT Time Calculation (min): 19 min ?Charges:  OT General Charges ?$OT Visit: 1 Visit ?OT Evaluation ?$OT Eval Moderate Complexity: 1 Mod ? ?Ann Bohne H., OTR/L ?Acute Rehabilitation ? ?Jenita Rayfield Elane Yolanda Bonine ?03/16/2022, 6:48 PM ?

## 2022-03-16 NOTE — Progress Notes (Addendum)
STROKE TEAM PROGRESS NOTE   INTERVAL HISTORY Patient is seen in his room with his wife at the bedside.  Yesterday, he awoke with new onset right sided facial droop and dysarthria.  He presented to the ED later that morning and was found to have acute infarcts in the right frontal operculum, superior frontal gyrus, right occipital pole and right cerebellum.  Patient denies any history of atrial fibrillation but review of electronic medical records reveals that he had worn a 2-week heart monitor and found to have atrial flutter/fibrillation by [place] cardiology was advised to take Eliquis but the patient was not on it at time of admission and was only on aspirin.  CT angiogram shows moderate bilateral supraclinoid stenosis but no intracranial occlusion.  Vitals:   03/15/22 2354 03/16/22 0000 03/16/22 0400 03/16/22 0905  BP: (!) 141/89 (!) 148/92 (!) 136/98 (!) 160/93  Pulse: 83   81  Resp: 16 20 18 18   Temp: 98.2 F (36.8 C) 98.1 F (36.7 C) 98 F (36.7 C) 99 F (37.2 C)  TempSrc:    Oral  SpO2: 100% 97% 99% 96%  Weight:       CBC:  Recent Labs  Lab 03/15/22 1110 03/15/22 1121 03/16/22 0024  WBC 11.1*  --  9.6  NEUTROABS 8.2*  --   --   HGB 15.8 16.3 14.6  HCT 46.2 48.0 41.8  MCV 90.8  --  89.1  PLT 214  --  194   Basic Metabolic Panel:  Recent Labs  Lab 03/16/22 0024 03/16/22 0911  NA 135 138  K 3.1* 3.6  CL 103 105  CO2 24 26  GLUCOSE 119* 130*  BUN 12 9  CREATININE 0.95 0.96  CALCIUM 8.8* 8.8*  MG  --  2.2   Lipid Panel:  Recent Labs  Lab 03/16/22 0024  CHOL 181  TRIG 189*  HDL 28*  CHOLHDL 6.5  VLDL 38  LDLCALC 161*   HgbA1c:  Recent Labs  Lab 03/16/22 0024  HGBA1C 6.6*   Urine Drug Screen:  Recent Labs  Lab 03/15/22 1204  LABOPIA NONE DETECTED  COCAINSCRNUR NONE DETECTED  LABBENZ NONE DETECTED  AMPHETMU NONE DETECTED  THCU POSITIVE*  LABBARB NONE DETECTED    Alcohol Level  Recent Labs  Lab 03/15/22 1110  ETH 52*    IMAGING past 24  hours MR BRAIN WO CONTRAST  Result Date: 03/16/2022 CLINICAL DATA:  66 year old male code stroke presentation. Right MCA infarct visible on plain CT. CTA negative for LV 0. EXAM: MRI HEAD WITHOUT CONTRAST TECHNIQUE: Multiplanar, multiecho pulse sequences of the brain and surrounding structures were obtained without intravenous contrast. COMPARISON:  CT head and CTA head and neck yesterday. FINDINGS: Brain: Cortical and subcortical restricted diffusion in an area of about 6 cm at the right frontal operculum tracking into the superior frontal gyrus (series 7, image 67). Cytotoxic edema and petechial hemorrhage on [facility] Classification 1B, series 14, image 41). No mass effect. Similar petechial hemorrhage and confluent cytotoxic edema with restricted diffusion in the right occipital pole (series 5, image 83 and series 14, image 17). This is in an area of 3 cm. No regional mass effect. And there is a linear acute infarct with restricted diffusion in the right cerebellar hemisphere on series 5, image 78. Similar T2 and FLAIR hyperintensity with no hemorrhage or mass effect. No other restricted diffusion or acute intracranial hemorrhage. No midline shift, mass effect, evidence of mass lesion, ventriculomegaly. Probable chronic microhemorrhage in the left  basal ganglia on SWI series 14, image 29. And small chronic lacunar infarct of the left cerebellum PICA territory on series 9, image 7. Aside from this in the acute findings gray and white matter signal is normal. Cervicomedullary junction and pituitary are within normal limits. Vascular: Major intracranial vascular flow voids are preserved. Skull and upper cervical spine: Negative. Normal bone marrow signal. Sinuses/Orbits: Negative orbits. Minor paranasal sinus mucosal thickening. Other: Mastoids are clear. Visible internal auditory structures appear normal. Negative visible scalp and face. IMPRESSION: 1. Acute infarcts in the Right MCA (6 cm, frontal  operculum and superior frontal gyrus), Right PCA (right occipital pole), and Right Cerebellar artery territories. Associated MCA and PCA Heidelberg Classification 1B petechial hemorrhage. No significant mass effect. 2. Underlying small chronic lacunar infarcts of the left globus pallidus and cerebellum (left PICA). Electronically Signed   By: Odessa Fleming M.D.   On: 03/16/2022 06:57    PHYSICAL EXAM General:  Alert, obese middle-aged African-American male patient in no acute distress Respiratory:  Regular, unlabored respirations on room  air  NEURO:  Mental Status: AA&Ox3  Speech/Language: speech is with significant dysarthria. Naming, repetition, fluency, and comprehension intact.  Cranial Nerves:  II: PERRL. Visual fields full.  III, IV, VI: EOMI. Eyelids elevate symmetrically.  V: Sensation is intact to light touch and symmetrical to face.  VII: Right sided facial droop noted VIII: hearing intact to voice. XII: tongue is midline without fasciculations. Motor: 5/5 strength to all muscle groups tested.  Sensation- Intact to light touch bilaterally.  Coordination: FTN intact bilaterally.No drift.  Gait- deferred  NIHSS 4- 2 dysarthria and 2 for facial weakness Premorbid modified Rankin scale 0 ASSESSMENT/PLAN [name] is a 66 y.o. male with history of DM2, HTN, HLD, tobacco abuse, ETOH abuse, cannabis use, gout and colon cancer in remission presenting with right sided facial droop and dysarthria which were noted to be present when he awoke at 0400.  He presented to the ED later that morning and was found to have a right MCA territory stroke.  Stroke:  right MCA branch, PCA and cerebellar infarcts likely secondary due to embolism in setting of atrial fibrillation without anticoagulation Code Stroke CT head acute cortical/subcortical infarcts in right MCA territory and occipital lobe ASPECTS 8 CTA head & neck negative for LVO, moderate calcific stenosis of bilateral supraclinoid  carotids MRI  acute infarcts in right MCA, PCA and right cerebellar artery territories with MCA and PCA territory petechial hemorrhage 2D Echo EF 60-65%, normal atrial septum LDL 115 HgbA1c 6.6 VTE prophylaxis - lovenox    Diet   DIET DYS 2 Room service appropriate? Yes; Fluid consistency: Thin   aspirin 81 mg daily prior to admission, now on aspirin 81 mg daily.  Therapy recommendations:  pending Disposition:  pending  Atrial fibrillation Patient has a history of atrial flutter/a-fib Was prescribed Eliquis in 12/22 by [facility] Was not taking Eliquis at home Will switch to Eliquis prior to discharge  Hypertension Home meds:  amlodipine 10 mg daily, lisinopril 40 mg daily Stable Permissive hypertension (OK if < 220/120) but gradually normalize in 5-7 days Long-term BP goal normotensive  Hyperlipidemia Home meds:  none LDL 115, goal < 70 Add atorvastatin 40 mg daily  Continue statin at discharge  Diabetes type II Controlled Home meds:  Ozempic 0.25 mg weekly HgbA1c 6.6, goal < 7.0 CBGs Recent Labs    03/15/22 1114 03/16/22 0011 03/16/22 0658  GLUCAP 136* 126* 128*    SSI  Other Stroke Risk Factors Cigarette smoker advised to stop smoking ETOH use, alcohol level 52, advised to drink no more than 2 drink(s) a day Substance abuse - UDS:  THC POSITIVE, Cocaine NONE DETECTED. Patient advised to stop using due to stroke risk. Obesity, Body mass index is 31.58 kg/m., BMI >/= 30 associated with increased stroke risk, recommend weight loss, diet and exercise as appropriate   Other Active Problems Colon cancer in remission Follow up with outpatient oncologist  Hospital day # 1  Cortney E Ernestina Columbia , MSN, AGACNP-BC [facility] See Amion for schedule and pager information 03/16/2022 12:51 PM   STROKE MD NOTE : I have personally obtained history,examined this patient, reviewed notes, independently viewed imaging studies, participated in medical  decision making and plan of care.ROS completed by me personally and pertinent positives fully documented  I have made any additions or clarifications directly to the above note. Agree with note above.  Patient presented with dysarthria and left facial weakness from multiple embolic infarcts likely from A-fib not on anticoagulation.  Patient counseled to take Eliquis as well as quit using marijuana and not smoke.  Continue ongoing stroke work-up and aggressive risk factor modification.  He also appears to be at risk for sleep apnea and may benefit with consideration for participation in the sleep smart stroke prevention study.  He was given written information to review and decide.  Long discussion with patient and wife and answered questions.  Discussed with Dr. Janee Morn.  Greater than 50% time during this 50-minute visit was spent in counseling and coordination of care about his embolic strokes and atrial fibrillation and need for anticoagulation and answering questions.  Delia Heady, MD Medical Director [facility] Pager: [phone] 03/16/2022 2:13 PM   To contact Stroke Continuity provider, please refer to [email or web address]. After hours, contact General Neurology

## 2022-03-17 DIAGNOSIS — R471 Dysarthria and anarthria: Secondary | ICD-10-CM

## 2022-03-17 DIAGNOSIS — I6340 Cerebral infarction due to embolism of unspecified cerebral artery: Secondary | ICD-10-CM

## 2022-03-17 DIAGNOSIS — R29810 Facial weakness: Principal | ICD-10-CM

## 2022-03-17 DIAGNOSIS — E1159 Type 2 diabetes mellitus with other circulatory complications: Secondary | ICD-10-CM

## 2022-03-17 DIAGNOSIS — E7800 Pure hypercholesterolemia, unspecified: Secondary | ICD-10-CM

## 2022-03-17 DIAGNOSIS — I4729 Other ventricular tachycardia: Secondary | ICD-10-CM | POA: Clinically undetermined

## 2022-03-17 LAB — BASIC METABOLIC PANEL
Anion gap: 6 (ref 5–15)
BUN: 7 mg/dL — ABNORMAL LOW (ref 8–23)
CO2: 27 mmol/L (ref 22–32)
Calcium: 8.9 mg/dL (ref 8.9–10.3)
Chloride: 107 mmol/L (ref 98–111)
Creatinine, Ser: 0.88 mg/dL (ref 0.61–1.24)
GFR, Estimated: >60 mL/min (ref >60)
Glucose, Bld: 99 mg/dL (ref 70–99)
Potassium: 3.5 mmol/L (ref 3.5–5.1)
Sodium: 140 mmol/L (ref 135–145)

## 2022-03-17 LAB — MAGNESIUM: Magnesium: 2.1 mg/dL (ref 1.7–2.4)

## 2022-03-17 LAB — GLUCOSE, CAPILLARY
Glucose-Capillary: 116 mg/dL — ABNORMAL HIGH (ref 70–99)
Glucose-Capillary: 151 mg/dL — ABNORMAL HIGH (ref 70–99)
Glucose-Capillary: 100 mg/dL — ABNORMAL HIGH (ref 70–99)

## 2022-03-17 MED ORDER — NICOTINE 14 MG/24HR TD PT24: 14.0000 mg | MEDICATED_PATCH | Freq: Every day | TRANSDERMAL | 0 refills | Status: DC

## 2022-03-17 MED ORDER — FOLIC ACID 1 MG PO TABS: 1.0000 mg | ORAL_TABLET | Freq: Every day | ORAL | 1 refills | Status: DC

## 2022-03-17 MED ORDER — POTASSIUM CHLORIDE CRYS ER 20 MEQ PO TBCR
40.0000 meq | EXTENDED_RELEASE_TABLET | Freq: Once | ORAL | Status: AC
  Administered 2022-03-17: 40 meq via ORAL
  Filled 2022-03-17: qty 2

## 2022-03-17 MED ORDER — THIAMINE HCL 100 MG PO TABS: 100.0000 mg | ORAL_TABLET | Freq: Every day | ORAL | Status: DC

## 2022-03-17 MED ORDER — AMLODIPINE BESYLATE 10 MG PO TABS: 10.0000 mg | ORAL_TABLET | Freq: Every day | ORAL | Status: AC

## 2022-03-17 MED ORDER — ADULT MULTIVITAMIN W/MINERALS CH: 1.0000 | ORAL_TABLET | Freq: Every day | ORAL | Status: DC

## 2022-03-17 MED ORDER — APIXABAN 5 MG PO TABS: 5.0000 mg | ORAL_TABLET | Freq: Two times a day (BID) | ORAL | 2 refills | Status: DC

## 2022-03-17 MED ORDER — ATORVASTATIN CALCIUM 40 MG PO TABS: 40.0000 mg | ORAL_TABLET | Freq: Every day | ORAL | 2 refills | Status: DC

## 2022-03-17 NOTE — Discharge Summary (Signed)
Physician Discharge Summary  ?Gary Frederick OYD:[id number] DOB: [birth year] DOA: 03/15/2022 ? ?PCP: Gary Beard, MD ? ?Admit date: 03/15/2022 ?Discharge date: 03/17/2022 ? ?Time spent: 55 minutes ? ?Recommendations for Outpatient Follow-up:  ?Follow-up with [facility] in 4 weeks. ?Follow-up with Gary Frederick, cardiologist in 2 weeks. ?Follow-up with Gary Beard, MD in 2 weeks.  On follow-up patient will need a basic metabolic profile, magnesium level checked to follow-up on electrolytes and renal function.  Patient's blood pressure need to be reassessed as patient's ACE inhibitor was discontinued on discharge ? ? ? ?Discharge Diagnoses:  ?Principal Problem: ?  CVA (cerebral vascular accident) ([employer]) ?Active Problems: ?  TOBACCO ABUSE ?  Essential hypertension ?  Alcohol abuse ?  Marijuana abuse ?  T2DM (type 2 diabetes mellitus) ([place]) ?  ARF (acute renal failure) ([place]) ?  Paroxysmal atrial fibrillation (H[facility]) ?  Hyperlipidemia ?  NSVT (nonsustained ventricular tachycardia) ([place]) ?  Dysarthria ?  Facial droop ? ? ?Discharge Condition: Stable and improved ? ?Diet recommendation: Dysphagia 2 diet with thin liquids. ? ?Gary Frederick Weights  ? 03/15/22 1130  ?Weight: 114.6 kg  ? ? ?History of present illness:  ?HPI per GaryLi ?Gary Frederick is a 66 y.o. male with medical history significant of alcohol misuse, tobacco abuse, T2DM, HTN, HLD, who presented with left facial droop, slurred speech and left arm numbness tingling. ?  ?Patient reports intermittent alcohol use and tobacco abuse 1 pack a day.  He admits drinking liquor 2 drinks along with 3-4 bottles of beer last night. He went to bed around 11 pm last night. He believes that this was his last normal. He woke up around 4 am but is unsure whether he had any symptoms at that time or not. Around 9 am this morning, he woke up and found left-sided facial droop and slurred speech.  Patient also reports left arm numbness tingling.  He thinks he may  have had transient left arm weakness but he is not sure.  Denies history of stroke.  Patient does have family history of stroke with his mother.  He reports that his father along with 5 of his brothers died from [place] disease.  ?  ?Patient continues ED for further evaluation.  In the emergency room, he was afebrile with a pulse 89, RR 16, BP 114/77 and room air O2 sats 94%.  The labs showed WBC 11.1 creatinine 1.66, glucose 120s, nonrevealing UA, alcohol level 52, UDS positive for Tetrahydrocannabinol. Head CT and CTA head and neck are as below.  Code stroke was called.  Neurology was consulted.  Patient received aspirin 325 mg p.o. x1 at the ED ?  ?Head CT showed "Acute cortical/subcortical right MCA territory infarct within the mid-to-posterior right frontal lobe. ASPECTS is 8. ?Additional small acute cortical/subcortical infarct within the right ?occipital lobe (right PCA vascular territory). ? A subcentimeter infarct within the inferior left cerebellar ?hemisphere is new from the prior head CT of 02/17/2014, but ?otherwise age-indeterminate ?  ?CTA Head and neck showed Moderate calcific stenosis of the supraclinoid internal carotid ?artery bilaterally ?  ?Hospital Course:  ?#1 acute right MCA branch, PCA and cerebellar infarcts likely secondary to embolism in the setting of A-fib not on anticoagulation ?-Patient had presented with left facial droop, dysarthric/slurred speech, left upper extremity tingling per patient.  Patient denied any left upper extremity weakness. ?-CT head done concerning for acute cortical/subcortical infarct in the right MCA territory and occipital lobes. ?-CT angiogram head and neck done negative  for LVO, moderate calcific stenosis of bilateral supraclinoid carotids. ?-MRI brain done with acute infarcts in the right MCA, PCA and right cerebellar artery territories with MCA and PCA territorial petechial hemorrhage. ?-2D echo done with normal EF, no wall motion abnormalities, no  source of emboli noted, normal atrial septum. ?-Fasting lipid panel with LDL of 115.  Triglyceride level of 189. ?-Hemoglobin A1c 6.6. ?-Patient admitted and placed on aspirin 81 mg daily initially on presentation. ?-It is noted per neurology that patient had an event monitor which noted A-fib and was to be on Eliquis(per cardiology office) however patient noncompliant with Eliquis. ?-Patient started on a statin ?-Neurology recommended resumption of anticoagulation with Eliquis which was resumed 03/16/2022. ?-Patient placed on a dysphagia 2 diet. ?-Patient seen by PT/OT/SLP. ?-Patient remained in stable condition and cleared by neurology for discharge. ?-Outpatient follow-up with neurology. ? ?2.  Paroxysmal atrial fibrillation ?-Patient with history of atrial flutter/A-fib. ?-Patient noted to have been prescribed Eliquis December 2022 per [facility] cardiology however patient NOT taking Eliquis at home. ?-Patient now presented with acute CVA. ?-Patient was maintained on home regimen Toprol-XL for rate control. ?-Aspirin discontinued and patient started back on Eliquis 5 mg twice daily per neurology recommendations on 03/16/2022.   ?-Outpatient follow-up with primary cardiologist. ? ?3.  ?? Nonsustained V. Tach ruled out ?-Patient noted to have a 37 beat run of V. tach on telemetry the morning of 03/17/2022 however patient remained asymptomatic. ?-Potassium noted at 3.5 and patient received K-Dur 40 mEq p.o. x1 earlier on this morning. ?-Magnesium noted at 2.1. ?-2D echo done during this hospitalization with a EF of 60 to 65%,NWMA, no significant valvular abnormalities. ?-Patient noted to have recently had a exercise nuclear stress test 11/22/2021 with no wall motion abnormalities, normal left ventricular systolic function. ?-Patient was maintained on home regimen Toprol-XL during the hospitalization.   ?-Patient seen in consultation by cardiology who reviewed telemetry and felt likely secondary to artifact and cleared  patient for discharge.   ?-Outpatient follow-up with primary cardiologist 2 weeks post discharge.   ? ?4.  Hypertension ?-Permissive hypertension secondary to problem #1 ( BP < 220/120) on presentation ?-Gradually normalize BP in the next 5 to 7 days ?-Patient's ACE inhibitor was held as patient had presented with acute kidney injury.   ?-Patient maintained on home regimen Toprol-XL with control of blood pressure during the hospitalization.   ?-Patient's home regimen Norvasc will be resumed 1 day post discharge.   ?-ACE inhibitor will be discontinued on discharge.   ?-Outpatient follow-up with PCP.  ? ?5.  Hyperlipidemia ?-LDL of 115. ?-Goal LDL < 70 ?-Patient started on a statin. ?-Resumed home regimen [place]. ?-Outpatient follow-up with PCP. ? ?6.  Well-controlled type 2 diabetes mellitus ?-Hemoglobin A1c 6.6. ?-Noted to be on Ozempic prior to admission which was held during the hospitalization. ?-Patient maintained on SSI. ?-Outpatient follow-up. ? ?7.  Tobacco abuse ?-Tobacco cessation. ?-Nicotine patch placed during the hospitalization. ? ?8.  Alcohol use ?-Alcohol cessation. ?-No signs of withdrawal during the hospitalization. ? ?9.  Acute kidney injury ?-Creatinine noted 1.66>>> 1.80 on admission. ?-Patient's ACE inhibitor was held during the hospitalization will not be resumed on discharge. ?-Patient hydrated with IV fluids with resolution of acute kidney injury. ?  ?9. ??  OSA ?-Sleep trial was recommended by neurology, patient stated at this time currently not interested and will follow-up in the outpatient setting with PCP to be set up for sleep study. ?-Outpatient follow-up with PCP. ?  ?  ? ?  Procedures: ?CT angiogram head and neck 03/15/2022 ?CT head 03/15/2022 ?2D echo 03/16/2022 ?MRI brain 03/16/2022 ? ?Consultations: ?Neurology:Dr Alfonse Spruce 03/15/2022 ?Cardiology : Dr Terri Skains 03/17/2022 ?  ? ?Discharge Exam: ?Vitals:  ? 03/17/22 1240 03/17/22 1531  ?BP: (!) 131/94 (!) 123/97  ?Pulse: 68 66  ?Resp: 18 18  ?Temp:  98.6 ?F (37 ?C) 98.8 ?F (37.1 ?C)  ?SpO2: 97% 100%  ? ? ?General: NAD.  Dysarthric speech. ?Cardiovascular: Regular rate rhythm no murmurs rubs or gallops.  No JVD.  No lower extremity edema. ?Respiratory:

## 2022-03-17 NOTE — TOC Initial Note (Addendum)
Transition of Care (TOC) - Initial/Assessment Note  ? ? ?Patient Details  ?Name: Gary Frederick ?MRN: [id number] ?Date of Birth: 1956/11/08 ? ?Transition of Care (TOC) CM/SW Contact:    ?Carles Collet, RN ?Phone Number: ?03/17/2022, 2:05 PM ? ?Clinical Narrative:              ?Spoke w patient at bedside. ?He states that he is form home, plans to reurn home w [facility] services at DC. He does not have a preference for [facility] agency used. ?Referral pending to [employer]. ?Patient declines tub bench, no DME needs for DC. ?[facility] unable to take, [facility] no response. [facility] able to take for SLP and OT with start of care on Monday. MD notified and agreeable to amend [facility] order to SLP and OT and remove [facility] RN as none available in his area.  ? ?Eliquis 30 card tubed to nurse who will provide to patient ? ?    ? ? ?Expected Discharge Plan: [place] ?Barriers to Discharge: Continued Medical Work up ? ? ?Patient Goals and CMS Choice ?Patient states their goals for this hospitalization and ongoing recovery are:: to go home ?CMS Medicare.gov Compare Post Acute Care list provided to:: Patient ?Choice offered to / list presented to : Patient ? ?Expected Discharge Plan and Services ?Expected Discharge Plan: [place] ?  ?Discharge Planning Services: CM Consult ?Post Acute Care Choice: Home Health ?  ?                ?  ?  ?  ?  ?Representative spoke with at DME Agency: declined tub bench ?HH Arranged: OT, RN, Speech Therapy ?[place] Agency: [facility] ?Date HH Agency Contacted: 03/17/22 ?Time [place]: 8101 ?Representative spoke with at [place] (pending) ? ?Prior Living Arrangements/Services ?  ?Lives with:: Spouse ?  ?Do you feel safe going back to the place where you live?: Yes      ?  ?  ?  ?  ? ?Activities of Daily Living ?  ?  ? ?Permission Sought/Granted ?  ?  ?   ?   ?   ?   ? ?Emotional Assessment ?  ?  ?  ?  ?  ?  ? ?Admission diagnosis:  Facial droop  [R29.810] ?CVA (cerebral vascular accident) [facility]) [I63.9] ?Dysarthria [R47.1] ?Acute CVA (cerebrovascular accident) ([place]) [I63.9] ?Patient Active Problem List  ? Diagnosis Date Noted  ? NSVT (nonsustained ventricular tachycardia) ([place]) 03/17/2022  ? Paroxysmal atrial fibrillation (HCC)   ? Hyperlipidemia   ? CVA (cerebral vascular accident) ([place]) 03/15/2022  ? Alcohol abuse 03/15/2022  ? Marijuana abuse 03/15/2022  ? T2DM (type 2 diabetes mellitus) ([place]) 03/15/2022  ? ARF (acute renal failure) ([place]) 03/15/2022  ? Colon cancer ([place]) 10/21/2012  ? HYPERLIPIDEMIA 12/04/2006  ? LYMPHADENOPATHY 12/04/2006  ? CARPAL TUNNEL RELEASE, RIGHT, HX OF 12/04/2006  ? GOUT 11/23/2006  ? ALCOHOL ABUSE 11/23/2006  ? TOBACCO ABUSE 11/23/2006  ? Essential hypertension 11/23/2006  ? ?PCP:  [name], MD ?Pharmacy:   ?[facility] [place] ?[address] [place] ?[place] [address] ?Phone: [phone] Fax: [phone] ? ? ? ? ?Social Determinants of Health (SDOH) Interventions ?  ? ?Readmission Risk Interventions ?   ? View : No data to display.  ?  ?  ?  ? ? ? ?

## 2022-03-17 NOTE — Progress Notes (Addendum)
STROKE TEAM PROGRESS NOTE  ? ?INTERVAL HISTORY ?Patient is seen in his room with no family at the bedside.  He has been hemodynamically stable overnight and his speech is somewhat clearer this morning. ? ?Vitals:  ? 03/16/22 2349 03/17/22 0355 03/17/22 0752 03/17/22 1240  ?BP: (!) 147/90 (!) 142/86 (!) 127/101 (!) 131/94  ?Pulse: 66 68 66 68  ?Resp:   18 18  ?Temp: 98.1 ?F (36.7 ?C) 98.7 ?F (37.1 ?C) 98.8 ?F (37.1 ?C) 98.6 ?F (37 ?C)  ?TempSrc: Oral Oral Oral Oral  ?SpO2: 95% 98% 96% 97%  ?Weight:      ? ?CBC:  ?Recent Labs  ?Lab 03/15/22 ?1110 03/15/22 ?1121 03/16/22 ?0024  ?WBC 11.1*  --  9.6  ?NEUTROABS 8.2*  --   --   ?HGB 15.8 16.3 14.6  ?HCT 46.2 48.0 41.8  ?MCV 90.8  --  89.1  ?PLT 214  --  194  ? ? ?Basic Metabolic Panel:  ?Recent Labs  ?Lab 03/16/22 ?8101 03/17/22 ?0149  ?NA 138 140  ?K 3.6 3.5  ?CL 105 107  ?CO2 26 27  ?GLUCOSE 130* 99  ?BUN 9 7*  ?CREATININE 0.96 0.88  ?CALCIUM 8.8* 8.9  ?MG 2.2  --   ? ? ?Lipid Panel:  ?Recent Labs  ?Lab 03/16/22 ?0024  ?CHOL 181  ?TRIG 189*  ?HDL 28*  ?CHOLHDL 6.5  ?VLDL 38  ?[place] 115*  ? ? ?HgbA1c:  ?Recent Labs  ?Lab 03/16/22 ?0024  ?HGBA1C 6.6*  ? ? ?Urine Drug Screen:  ?Recent Labs  ?Lab 03/15/22 ?1204  ?LABOPIA NONE DETECTED  ?COCAINSCRNUR NONE DETECTED  ?LABBENZ NONE DETECTED  ?AMPHETMU NONE DETECTED  ?THCU POSITIVE*  ?LABBARB NONE DETECTED  ? ?  ?Alcohol Level  ?Recent Labs  ?Lab 03/15/22 ?1110  ?ETH 52*  ? ? ? ?IMAGING past 24 hours ?No results found. ? ?PHYSICAL EXAM ?General:  Alert, obese middle-aged African-American male patient in no acute distress ?Respiratory:  Regular, unlabored respirations on room  air ? ?NEURO:  ?Mental Status: AA&Ox3  ?Speech/Language: speech is with significant dysarthria. Naming, repetition, fluency, and comprehension intact. ? ?Cranial Nerves:  ?II: PERRL. Visual fields full.  ?III, IV, VI: EOMI. Eyelids elevate symmetrically.  ?V: Sensation is intact to light touch and symmetrical to face.  ?VII: Right sided facial droop  noted ?VIII: hearing intact to voice. ?XII: tongue is midline without fasciculations. ?Motor: 5/5 strength to all muscle groups tested.  ?Sensation- Intact to light touch bilaterally.  ?Coordination: FTN intact bilaterally.No drift.  ?Gait- deferred ? ?NIHSS 4- 2 dysarthria and 2 for facial weakness ?Premorbid modified Rankin scale 0 ?ASSESSMENT/PLAN ?[name] is a 66 y.o. male with history of DM2, HTN, HLD, tobacco abuse, ETOH abuse, cannabis use, gout and colon cancer in remission presenting with right sided facial droop and dysarthria which were noted to be present when he awoke at 0400.  He presented to the ED later that morning and was found to have a right MCA territory stroke. ? ?Stroke:  right MCA branch, PCA and cerebellar infarcts likely secondary atrial fibrillation not taking eliquis ?Code Stroke CT head acute cortical/subcortical infarcts in right MCA territory and occipital lobe ASPECTS 8 ?CTA head & neck negative for LVO, moderate calcific stenosis of bilateral supraclinoid carotids ?MRI  acute infarcts in right MCA, PCA and right cerebellar artery territories with MCA and PCA territory petechial hemorrhage ?2D Echo EF 60-65%, normal atrial septum ?LDL 115 ?HgbA1c 6.6 ?VTE prophylaxis - eliquis ?aspirin 81 mg  daily prior to admission, now on eliquis ?Therapy recommendations: home health OT ?Disposition:  pending ? ?Atrial fibrillation ?Patient has a history of atrial flutter/a-fib ?Was prescribed Eliquis in 12/22 by [facility] Cardiology ?Was not taking Eliquis at home ?On Eliquis now, continue on discharge ? ?Hypertension ?Home meds:  amlodipine 10 mg daily, lisinopril 40 mg daily ?Stable ?Long-term BP goal normotensive ? ?Hyperlipidemia ?Home meds:  none ?LDL 115, goal < 70 ?Add atorvastatin 40 mg daily  ?Continue statin at discharge ? ?Diabetes type II Controlled ?Home meds:  Ozempic 0.25 mg weekly ?HgbA1c 6.6, goal < 7.0 ?CBGs ?SSI ? ?Tobacco abuse ?Current smoker ?Smoking cessation  counseling provided ?Nicotine patch provided ?Pt is willing to quit ? ?Other Stroke Risk Factors ?ETOH use, alcohol level 52, advised to drink no more than 2 drink(s) a day ?Substance abuse - UDS:  THC POSITIVE,Patient advised to stop using due to stroke risk. ?Obesity, Body mass index is 31.58 kg/m?., BMI >/= 30 associated with increased stroke risk, recommend weight loss, diet and exercise as appropriate  ? ?Other Active Problems ?Colon cancer in remission ?Follow up with outpatient oncologist ? ?Hospital day # 2 ? ?[place] , MSN, AGACNP-BC ?[facility] ?See Amion for schedule and pager information ?03/17/2022 12:53 PM ? ?ATTENDING NOTE: ?I reviewed above note and agree with the assessment and plan. Pt was seen and examined.  ? ?66 year old male with history of hypertension, hyperlipidemia, diabetes, smoker, alcohol abuse, THC use, gout, colon cancer in remission and recent A-fib not compliant with Eliquis admitted for right facial droop and slurred speech.  CT showed right frontal and occipital infarcts, old left small cerebellar infarct.  CT head and neck unremarkable, moderate atherosclerosis bilateral ICA siphon.  MRI showed right MCA, PCA and right cerebellum small infarcts with petechial hemorrhage, as well as old small left BG and left cerebellum infarct.  EF 60 to 65%, LDL 115, A1c 6.6.  THC positive.  Creatinine 0.88. ? ?On exam, patient awake alert, orientated x3, walking in the room without difficulty.  Mild dysarthria, no aphasia follows simple commands.  Visual field full, still has left facial droop, no gaze palsy.  Left upper extremity 4+/5 proximal due to old shoulder injury.  Right upper extremity and bilateral lower extremity 5/5.  Sensation symmetrical.  Finger-to-nose intact bilaterally. ? ?Etiology for patient stroke likely due to A-fib not on [facility].  Now he is back on Eliquis '5mg'$  twice daily, educated on medication compliance.  Continue Lipitor 40.  Educated on smoking  cessation and limitation of alcohol use.  Educated on [facility] cessation too.  Stroke risk factor modification.  PT/OT recommend home health OT. ? ?For detailed assessment and plan, please refer to above as I have made changes wherever appropriate.  ? ?Neurology will sign off. Please call with questions. Pt will follow up with stroke clinic NP at [facility] in about 4 weeks. Thanks for the consult. ? ? ?Rosalin Hawking, MD PhD ?Stroke Neurology ?03/17/2022 ?1:15 PM ? ? ?  ? ?To contact Stroke Continuity provider, please refer to [email or web address] ?After hours, contact General Neurology  ?

## 2022-03-17 NOTE — Progress Notes (Signed)
?PROGRESS NOTE ? ? ? ?Gary Frederick  HER:[id number] DOB: [birth year] DOA: 03/15/2022 ?PCP: [name], MD  ? ? ?Chief Complaint  ?Patient presents with  ? Code Stroke  ? ? ?Brief Narrative:  ?Patient 66 year old gentleman history of alcohol use, tobacco use, type 2 diabetes, hypertension, hyperlipidemia presented with left facial droop, slurred speech, left arm tingling and numbness per admitting physician.  Head CT done concerning for acute CVA.  Neurology consulted.  Patient admitted for stroke work-up. ? ? ?Assessment & Plan: ?  ?Principal Problem: ?  CVA (cerebral vascular accident) ([place]) ?Active Problems: ?  TOBACCO ABUSE ?  Essential hypertension ?  Alcohol abuse ?  Marijuana abuse ?  T2DM (type 2 diabetes mellitus) ([place]) ?  ARF (acute renal failure) ([place]) ?  Paroxysmal atrial fibrillation ([facility]) ?  Hyperlipidemia ? ? ?#1 acute right MCA branch, PCA and cerebellar infarcts likely secondary to embolism in the setting of A-fib not on anticoagulation ?-Patient had presented with left facial droop, dysarthric/slurred speech, left upper extremity tingling per patient.  Patient denied any left upper extremity weakness. ?-CT head done concerning for acute cortical/subcortical infarct in the right MCA territory and occipital lobes. ?-CT angiogram head and neck done negative for LVO, moderate calcific stenosis of bilateral supraclinoid carotids. ?-MRI brain done with acute infarcts in the right MCA, PCA and right cerebellar artery territories with MCA and PCA territorial petechial hemorrhage. ?-2D echo done with normal EF, no wall motion abnormalities, no source of emboli noted, normal atrial septum. ?-Fasting lipid panel with LDL of 115.  Triglyceride level of 189. ?-Hemoglobin A1c 6.6. ?-Patient admitted and placed on aspirin 81 mg daily. ?-It is noted per neurology that patient had an event monitor which noted A-fib and was to be on Eliquis(per cardiology office) however patient noncompliant with  Eliquis. ?-Patient started on a statin ?-Neurology recommending resumption of anticoagulation with Eliquis which was resumed 03/16/2022. ?-Patient currently on a dysphagia 2 diet. ?-PT/OT/SLP. ?-We will need home health SLP, OT which has been ordered. ?-Neurology following and appreciate input and recommendation. ? ?2.  Paroxysmal atrial fibrillation ?-Patient with history of atrial flutter/A-fib. ?-Patient noted to have been prescribed Eliquis December 2022 per [facility] cardiology however patient NOT taking Eliquis at home. ?-Patient now presented with acute CVA. ?-Continue home regimen Toprol-XL for rate control. ?-Aspirin discontinued and patient started back on Eliquis 5 mg twice daily per neurology recommendations on 03/16/2022.   ?-Outpatient follow-up with primary cardiologist. ? ?3.  Nonsustained V. Tach ?-Patient noted to have a 37 beat run of V. tach on telemetry monitor this morning per RN. ?-Patient noted to be sitting in the chair and was asymptomatic. ?-Potassium noted at 3.5 this morning and patient received K-Dur 40 mEq p.o. x1 earlier on this morning. ?-Magnesium noted at 2.1. ?-2D echo done during this hospitalization with a EF of 60 to 65%,NWMA, no significant valvular abnormalities. ?-Currently on home regimen Toprol-XL. ?-Consult with cardiology for further evaluation and management. ? ?4.  Hypertension ?-Permissive hypertension secondary to problem #1 ( BP < 220/120) ?-Gradually normalize BP in the next 5 to 7 days ?-Continue to hold ACE inhibitor as patient had presented with AKI.   ?-Continue Toprol-XL.   ?-Could likely resume half home dose Norvasc in the next 1 to 2 days.   ?-Outpatient follow-up with PCP.  ? ?5.  Hyperlipidemia ?-LDL of 115. ?-Goal LDL < 70 ?-Patient started on a statin. ?-Resume home regimen [place]. ?-Outpatient follow-up with PCP. ? ?6.  Well-controlled type  2 diabetes mellitus ?-Hemoglobin A1c 6.6. ?-Noted to be on Ozempic prior to admission. ?-CBG 116 this morning.    ?-SSI. ? ?7.  Tobacco abuse ?-Tobacco cessation. ?-Nicotine patch. ? ?8.  Alcohol use ?-Alcohol cessation. ?-No signs of withdrawal. ? ?9.  Acute kidney injury ?-Creatinine noted 1.66>>> 1.80 on admission. ?-Improving with hydration. ?-ACE inhibitor on hold. ? ?9. ??  OSA ?-Sleep trial was recommended by neurology, patient states at this time currently not interested and will follow-up in the outpatient setting with PCP to be set up for sleep study. ? ? ?DVT prophylaxis: Lovenox>>>> Eliquis ?Code Status: Full ?Family Communication: Updated patient and wife at bedside ?Disposition:  ? ?Status is: Inpatient ?Remains inpatient appropriate because: Severity of illness.  Work-up ongoing. ?  ?Consultants:  ?Neurology:Dr Alfonse Spruce 03/15/2022 ?Cardiology pending ? ?Procedures:  ?CT angiogram head and neck 03/15/2022 ?CT head 03/15/2022 ?2D echo 03/16/2022 ?MRI brain 03/16/2022 ? ? ? ?Antimicrobials:  ?None ? ? ?Subjective: ?Patient sitting up in chair.  No chest pain.  No shortness of breath.  Still with dysarthric speech.  Also with some left facial weakness.  Patient denies any tingling or numbness in left upper extremity.  Overall feeling better.  Stated he decided not to do the sleep study and would rather follow-up with his PCP for outpatient referral for evaluation for sleep apnea.  Tolerated Eliquis.  Wife at bedside.   ?Was called by RN that patient noted to have a 37 beat run of asymptomatic V. tach.  ? ?Objective: ?Vitals:  ? 03/16/22 2349 03/17/22 0355 03/17/22 0752 03/17/22 1240  ?BP: (!) 147/90 (!) 142/86 (!) 127/101 (!) 131/94  ?Pulse: 66 68 66 68  ?Resp:   18 18  ?Temp: 98.1 ?F (36.7 ?C) 98.7 ?F (37.1 ?C) 98.8 ?F (37.1 ?C) 98.6 ?F (37 ?C)  ?TempSrc: Oral Oral Oral Oral  ?SpO2: 95% 98% 96% 97%  ?Weight:      ? ? ?Intake/Output Summary (Last 24 hours) at 03/17/2022 1306 ?Last data filed at 03/16/2022 1615 ?Gross per 24 hour  ?Intake 787.5 ml  ?Output --  ?Net 787.5 ml  ? ? ?Filed Weights  ? 03/15/22 1130  ?Weight: 114.6  kg  ? ? ?Examination: ? ?General exam: NAD. ?Respiratory system: CTA B.  No wheezes, no crackles, no rhonchi.  Normal respiratory effort. ?Cardiovascular system: Regular rate rhythm no murmurs rubs or gallops.  No JVD.  No lower extremity edema.  ?Gastrointestinal system: Abdomen is nondistended, soft and nontender. No organomegaly or masses felt. Normal bowel sounds heard. ?Central nervous system: Left facial droop.  Dysarthric speech.  5/5 bilateral upper extremity strength, 5/5 bilateral lower extremity strength.  ?Extremities: Symmetric 5 x 5 power. ?Skin: No rashes, lesions or ulcers ?Psychiatry: Judgement and insight appear normal. Mood & affect appropriate.  ? ? ? ?Data Reviewed: I have personally reviewed following labs and imaging studies ? ?CBC: ?Recent Labs  ?Lab 03/15/22 ?1110 03/15/22 ?1121 03/16/22 ?0024  ?WBC 11.1*  --  9.6  ?NEUTROABS 8.2*  --   --   ?HGB 15.8 16.3 14.6  ?HCT 46.2 48.0 41.8  ?MCV 90.8  --  89.1  ?PLT 214  --  194  ? ? ? ?Basic Metabolic Panel: ?Recent Labs  ?Lab 03/15/22 ?1110 03/15/22 ?1121 03/16/22 ?0024 03/16/22 ?5784 03/17/22 ?0149  ?NA 138 138 135 138 140  ?K 3.7 3.7 3.1* 3.6 3.5  ?CL 102 102 103 105 107  ?CO2 23  --  '24 26 27  '$ ?GLUCOSE 129* 127* 119*  130* 99  ?BUN '11 13 12 9 '$ 7*  ?CREATININE 1.66* 1.80* 0.95 0.96 0.88  ?CALCIUM 9.2  --  8.8* 8.8* 8.9  ?MG  --   --   --  2.2  --   ? ? ? ?GFR: ?Estimated Creatinine Clearance: 115.8 mL/min (by C-G formula based on SCr of 0.88 mg/dL). ? ?Liver Function Tests: ?Recent Labs  ?Lab 03/15/22 ?1110  ?AST 31  ?ALT 34  ?ALKPHOS 43  ?BILITOT 0.7  ?PROT 7.3  ?ALBUMIN 4.0  ? ? ? ?CBG: ?Recent Labs  ?Lab 03/16/22 ?1259 03/16/22 ?1608 03/16/22 ?2054 03/17/22 ?0630 03/17/22 ?1238  ?GLUCAP 135* 103* 110* 116* 151*  ? ? ? ? ?No results found for this or any previous visit (from the past 240 hour(s)).  ? ? ? ? ? ?Radiology Studies: ?MR BRAIN WO CONTRAST ? ?Result Date: 03/16/2022 ?CLINICAL DATA:  66 year old male code stroke presentation. Right MCA  infarct visible on plain CT. CTA negative for LV 0. EXAM: MRI HEAD WITHOUT CONTRAST TECHNIQUE: Multiplanar, multiecho pulse sequences of the brain and surrounding structures were obtained without intravenous contrast. C

## 2022-03-17 NOTE — Plan of Care (Signed)
?  Problem: Education: Goal: Knowledge of General Education information will improve Description: Including pain rating scale, medication(s)/side effects and non-pharmacologic comfort measures Outcome: Progressing   Problem: Health Behavior/Discharge Planning: Goal: Ability to manage health-related needs will improve Outcome: Progressing   Problem: Clinical Measurements: Goal: Ability to maintain clinical measurements within normal limits will improve Outcome: Progressing Goal: Will remain free from infection Outcome: Progressing Goal: Diagnostic test results will improve Outcome: Progressing Goal: Respiratory complications will improve Outcome: Progressing Goal: Cardiovascular complication will be avoided Outcome: Progressing   Problem: Activity: Goal: Risk for activity intolerance will decrease Outcome: Progressing   Problem: Nutrition: Goal: Adequate nutrition will be maintained Outcome: Progressing   Problem: Coping: Goal: Level of anxiety will decrease Outcome: Progressing   Problem: Elimination: Goal: Will not experience complications related to bowel motility Outcome: Progressing Goal: Will not experience complications related to urinary retention Outcome: Progressing   Problem: Pain Managment: Goal: General experience of comfort will improve Outcome: Progressing   Problem: Safety: Goal: Ability to remain free from injury will improve Outcome: Progressing   Problem: Skin Integrity: Goal: Risk for impaired skin integrity will decrease Outcome: Progressing   Problem: Education: Goal: Knowledge of disease or condition will improve Outcome: Progressing Goal: Knowledge of secondary prevention will improve (SELECT ALL) Outcome: Progressing Goal: Knowledge of patient specific risk factors will improve (INDIVIDUALIZE FOR PATIENT) Outcome: Progressing Goal: Individualized Educational Video(s) Outcome: Progressing   Problem: Coping: Goal: Will verbalize  positive feelings about self Outcome: Progressing Goal: Will identify appropriate support needs Outcome: Progressing   Problem: Health Behavior/Discharge Planning: Goal: Ability to manage health-related needs will improve Outcome: Progressing   Problem: Self-Care: Goal: Ability to participate in self-care as condition permits will improve Outcome: Progressing Goal: Verbalization of feelings and concerns over difficulty with self-care will improve Outcome: Progressing Goal: Ability to communicate needs accurately will improve Outcome: Progressing   Problem: Nutrition: Goal: Risk of aspiration will decrease Outcome: Progressing Goal: Dietary intake will improve Outcome: Progressing   Problem: Ischemic Stroke/TIA Tissue Perfusion: Goal: Complications of ischemic stroke/TIA will be minimized Outcome: Progressing   

## 2022-03-17 NOTE — Consult Note (Signed)
CARDIOLOGY CONSULT NOTE  ?Patient ID: ?Gary Frederick ?MRN: [id number] ?DOB/AGE: Jun 27, 1956 66 y.o. ? ?Admit date: 03/15/2022 ?Attending physician: Eugenie Filler, MD ?Primary Physician:  [name], MD ?Outpatient cardiology provider: Lawerance Cruel, PA ?Inpatient Cardiologist: Rex Kras, DO, [facility] ? ?Reason of consultation: Nonsustained ventricular tachycardia ?Referring physician: Dr. Irine Seal ? ?Chief complaint: Left facial droop, slurred speech, left arm numbness and tingling ? ?HPI:  ?Gary Frederick is a 66 y.o. African-American male who presents with a chief complaint of " left facial droop, slurred speech, left arm numbness/tingling." His past medical history and cardiovascular risk factors include: Atrial flutter, hypertension, hyperlipidemia, diabetes mellitus type 2, active smoker, history of colon cancer status postresection in 2014, noncompliant, excessive alcohol consumption, cigarette smoking. ? ?Patient presents to the hospital with a chief complaint of facial droop, slurred speech, left arm numbness and tingling.  CT head notes acute cortical/subcortical right MCA infarct with additional small acute cortical/subcortical infarcts within the right occipital lobe (right PCA vascular territory).  The working diagnosis is that the stroke was likely due to him not being on oral anticoagulation despite having history of atrial flutter/fibrillation.  He was seen by neurology and cleared to be discharged home.  However, primary team was called by nursing staff who was alerted by central telemetry regarding episodes of nonsustained ventricular tachycardia.  Patient was completely asymptomatic during this time.  ? ?Patient was last seen in the office in December 2023 for an newly discovered atrial flutter (no EKG to review).  He underwent ischemic work-up including an echocardiogram and stress test results reviewed as part of today's encounter.  Ambulatory cardiac monitor also noted  presence of atrial fibrillation/flutter.  Since then has not followed up. ? ?Currently accompanied with by his wife watching TV. Denies chest pain or heart failure symptoms. No recent syncope.  ? ?ALLERGIES: ?No Known Allergies ? ?PAST MEDICAL HISTORY: ?Past Medical History:  ?Diagnosis Date  ? Arthritis   ? Cancer ([place]) 10/2012  ? colon  ? Constipation   ? Diabetes mellitus without complication ([place])   ? Gout   ? Hyperlipidemia   ? Hypertension   ? Insomnia   ? ? ?PAST SURGICAL HISTORY: ?Past Surgical History:  ?Procedure Laterality Date  ? CARPAL TUNNEL RELEASE    ? right  ? COLON RESECTION  12/11/2012  ? Procedure: LAPAROSCOPIC RIGHT COLON RESECTION;  Surgeon: Leighton Ruff, MD;  Location: [facility]S;  Service: General;  Laterality: Right;  ? COLON SURGERY  12/11/2012  ? LIVER ULTRASOUND  12/11/2012  ? Procedure: LIVER ULTRASOUND;  Surgeon: Leighton Ruff, MD;  Location: [facility] ORS;  Service: General;  Laterality: Right;  Intraoperative Liver Ultrasound  ? ? ?FAMILY HISTORY: ?The patient's family history includes Diabetes in his mother; Huntington's disease in his father; Hypertension in his brother; Stroke in his mother. ?  ?SOCIAL HISTORY:  ?The patient  reports that he has been smoking cigarettes. He has a 30.00 pack-year smoking history. He has never used smokeless tobacco. He reports current alcohol use of about 5.0 standard drinks per week. He reports that he does not use drugs. ? ?MEDICATIONS: ?Current Outpatient Medications  ?Medication Instructions  ? acetaminophen (TYLENOL) 500 mg, Oral, Every 6 hours PRN  ? allopurinol (ZYLOPRIM) 100 mg, Oral, Daily  ? amLODipine (NORVASC) 10 mg, Daily with lunch  ? apixaban (ELIQUIS) 5 mg, Oral, 2 times daily  ? aspirin 81 mg, Oral, 2 times daily  ? [START ON 03/18/2022] atorvastatin (LIPITOR) 40 mg, Oral, Daily  ? [  START ON 3/41/9622] folic acid (FOLVITE) 1 mg, Oral, Daily  ? lisinopril (ZESTRIL) 40 mg, Daily with lunch  ? metoprolol succinate (TOPROL-XL) 50 mg, Oral, Daily  ?  [START ON 03/18/2022] Multiple Vitamin (MULTIVITAMIN WITH MINERALS) TABS tablet 1 tablet, Oral, Daily  ? [START ON 03/18/2022] nicotine (NICODERM CQ - DOSED IN MG/24 HOURS) 14 mg, Transdermal, Daily  ? omega-3 acid ethyl esters (LOVAZA) 2 g, Oral, Daily  ? ONE TOUCH ULTRA TEST test strip USE 1 STRIP TO CHECK GLUCOSE TWICE DAILY  ? Ozempic (0.25 or 0.5 MG/DOSE) 0.25 mg, Subcutaneous, Weekly, Tuesdays  ? Polyvinyl Alcohol-Povidone (REFRESH OP) 1 drop, Both Eyes, 2 times daily PRN  ? tadalafil (CIALIS) 20 mg, Oral, See admin instructions, Every 3 days as needed for erectile dysfunction  ? [START ON 03/18/2022] thiamine 100 mg, Oral, Daily  ? ? ?REVIEW OF SYSTEMS: ?Review of Systems  ?Cardiovascular:  Negative for chest pain, dyspnea on exertion, leg swelling, near-syncope, orthopnea, palpitations, paroxysmal nocturnal dyspnea and syncope.  ?Neurological:  Negative for dizziness, headaches and light-headedness.  ?     Facial asymmetry, dysarthric speech.  No receptive or expressive aphasia  ?All other systems reviewed and are negative. ? ?PHYSICAL EXAM: ? ?  03/17/2022  ?  3:31 PM 03/17/2022  ? 12:40 PM 03/17/2022  ?  7:52 AM  ?Vitals with BMI  ?Systolic 297 989 211  ?Diastolic 97 94 941  ?Pulse 66 68 66  ? ? ? ?Intake/Output Summary (Last 24 hours) at 03/17/2022 1549 ?Last data filed at 03/16/2022 1615 ?Gross per 24 hour  ?Intake 787.5 ml  ?Output --  ?Net 787.5 ml  ?  ?Net IO Since Admission: 1,001.31 mL [03/17/22 1549] ? ?CONSTITUTIONAL: Well-developed and well-nourished. No acute distress.  ?SKIN: Skin is warm and dry. No rash noted. No cyanosis. No pallor. No jaundice ?HEAD: Normocephalic and atraumatic.  ?EYES: No scleral icterus ?MOUTH/THROAT: Moist oral membranes.  ?NECK: No JVD present. No thyromegaly noted. No carotid bruits  ?CHEST Normal respiratory effort. No intercostal retractions  ?LUNGS: Clear to auscultation bilaterally.  No wheezes rales or rhonchi's.   ?CARDIOVASCULAR: Regular, positive S1-S2, no murmurs  rubs or gallops appreciated. ?Abdomen: Obese, soft, nontender, nondistended, positive bowel sounds in all 4 quadrants. ?Extremities: No pitting edema, warm to touch,  ?HEMATOLOGIC: No significant bruising ?NEUROLOGIC: Oriented to person, place, and time.  Facial asymmetry, dysarthric speech, strength 3+ in the upper and lower extremities bilaterally. ?PSYCHIATRIC: Normal mood and affect. Normal behavior. Cooperative ? ?RADIOLOGY: ?MR BRAIN WO CONTRAST ? ?Result Date: 03/16/2022 ?CLINICAL DATA:  66 year old male code stroke presentation. Right MCA infarct visible on plain CT. CTA negative for LV 0. EXAM: MRI HEAD WITHOUT CONTRAST TECHNIQUE: Multiplanar, multiecho pulse sequences of the brain and surrounding structures were obtained without intravenous contrast. COMPARISON:  CT head and CTA head and neck yesterday. FINDINGS: Brain: Cortical and subcortical restricted diffusion in an area of about 6 cm at the right frontal operculum tracking into the superior frontal gyrus (series 7, image 67). Cytotoxic edema and petechial hemorrhage on SWI [employer] Classification 1B, series 14, image 41). No mass effect. Similar petechial hemorrhage and confluent cytotoxic edema with restricted diffusion in the right occipital pole (series 5, image 83 and series 14, image 17). This is in an area of 3 cm. No regional mass effect. And there is a linear acute infarct with restricted diffusion in the right cerebellar hemisphere on series 5, image 78. Similar T2 and FLAIR hyperintensity with no hemorrhage or mass effect. No  other restricted diffusion or acute intracranial hemorrhage. No midline shift, mass effect, evidence of mass lesion, ventriculomegaly. Probable chronic microhemorrhage in the left basal ganglia on SWI series 14, image 29. And small chronic lacunar infarct of the left cerebellum PICA territory on series 9, image 7. Aside from this in the acute findings gray and white matter signal is normal. Cervicomedullary junction  and pituitary are within normal limits. Vascular: Major intracranial vascular flow voids are preserved. Skull and upper cervical spine: Negative. Normal bone marrow signal. Sinuses/Orbits: Negative orbits. [place]

## 2022-03-19 DIAGNOSIS — M1990 Unspecified osteoarthritis, unspecified site: Secondary | ICD-10-CM | POA: Diagnosis not present

## 2022-03-19 DIAGNOSIS — I480 Paroxysmal atrial fibrillation: Secondary | ICD-10-CM | POA: Diagnosis not present

## 2022-03-19 DIAGNOSIS — I69391 Dysphagia following cerebral infarction: Secondary | ICD-10-CM | POA: Diagnosis not present

## 2022-03-19 DIAGNOSIS — Z85038 Personal history of other malignant neoplasm of large intestine: Secondary | ICD-10-CM | POA: Diagnosis not present

## 2022-03-19 DIAGNOSIS — I69354 Hemiplegia and hemiparesis following cerebral infarction affecting left non-dominant side: Secondary | ICD-10-CM | POA: Diagnosis not present

## 2022-03-19 DIAGNOSIS — K5900 Constipation, unspecified: Secondary | ICD-10-CM | POA: Diagnosis not present

## 2022-03-19 DIAGNOSIS — E785 Hyperlipidemia, unspecified: Secondary | ICD-10-CM | POA: Diagnosis not present

## 2022-03-19 DIAGNOSIS — I471 Supraventricular tachycardia: Secondary | ICD-10-CM | POA: Diagnosis not present

## 2022-03-19 DIAGNOSIS — G4700 Insomnia, unspecified: Secondary | ICD-10-CM | POA: Diagnosis not present

## 2022-03-19 DIAGNOSIS — E119 Type 2 diabetes mellitus without complications: Secondary | ICD-10-CM | POA: Diagnosis not present

## 2022-03-19 DIAGNOSIS — N179 Acute kidney failure, unspecified: Secondary | ICD-10-CM | POA: Diagnosis not present

## 2022-03-19 DIAGNOSIS — I69322 Dysarthria following cerebral infarction: Secondary | ICD-10-CM | POA: Diagnosis not present

## 2022-03-19 DIAGNOSIS — Z7901 Long term (current) use of anticoagulants: Secondary | ICD-10-CM | POA: Diagnosis not present

## 2022-03-19 DIAGNOSIS — M109 Gout, unspecified: Secondary | ICD-10-CM | POA: Diagnosis not present

## 2022-03-19 DIAGNOSIS — I10 Essential (primary) hypertension: Secondary | ICD-10-CM | POA: Diagnosis not present

## 2022-03-19 DIAGNOSIS — F17210 Nicotine dependence, cigarettes, uncomplicated: Secondary | ICD-10-CM | POA: Diagnosis not present

## 2022-03-20 DIAGNOSIS — E1169 Type 2 diabetes mellitus with other specified complication: Secondary | ICD-10-CM | POA: Diagnosis not present

## 2022-03-20 DIAGNOSIS — I480 Paroxysmal atrial fibrillation: Secondary | ICD-10-CM | POA: Diagnosis not present

## 2022-03-20 DIAGNOSIS — I63311 Cerebral infarction due to thrombosis of right middle cerebral artery: Secondary | ICD-10-CM | POA: Diagnosis not present

## 2022-03-20 DIAGNOSIS — R1310 Dysphagia, unspecified: Secondary | ICD-10-CM | POA: Diagnosis not present

## 2022-03-20 DIAGNOSIS — I69322 Dysarthria following cerebral infarction: Secondary | ICD-10-CM | POA: Diagnosis not present

## 2022-03-20 DIAGNOSIS — I10 Essential (primary) hypertension: Secondary | ICD-10-CM | POA: Diagnosis not present

## 2022-03-20 DIAGNOSIS — Z720 Tobacco use: Secondary | ICD-10-CM | POA: Diagnosis not present

## 2022-03-20 DIAGNOSIS — E782 Mixed hyperlipidemia: Secondary | ICD-10-CM | POA: Diagnosis not present

## 2022-03-21 DIAGNOSIS — E785 Hyperlipidemia, unspecified: Secondary | ICD-10-CM | POA: Diagnosis not present

## 2022-03-21 DIAGNOSIS — I480 Paroxysmal atrial fibrillation: Secondary | ICD-10-CM | POA: Diagnosis not present

## 2022-03-21 DIAGNOSIS — M1990 Unspecified osteoarthritis, unspecified site: Secondary | ICD-10-CM | POA: Diagnosis not present

## 2022-03-21 DIAGNOSIS — Z85038 Personal history of other malignant neoplasm of large intestine: Secondary | ICD-10-CM | POA: Diagnosis not present

## 2022-03-21 DIAGNOSIS — N179 Acute kidney failure, unspecified: Secondary | ICD-10-CM | POA: Diagnosis not present

## 2022-03-21 DIAGNOSIS — I69391 Dysphagia following cerebral infarction: Secondary | ICD-10-CM | POA: Diagnosis not present

## 2022-03-21 DIAGNOSIS — E119 Type 2 diabetes mellitus without complications: Secondary | ICD-10-CM | POA: Diagnosis not present

## 2022-03-21 DIAGNOSIS — I471 Supraventricular tachycardia: Secondary | ICD-10-CM | POA: Diagnosis not present

## 2022-03-21 DIAGNOSIS — Z7901 Long term (current) use of anticoagulants: Secondary | ICD-10-CM | POA: Diagnosis not present

## 2022-03-21 DIAGNOSIS — G4700 Insomnia, unspecified: Secondary | ICD-10-CM | POA: Diagnosis not present

## 2022-03-21 DIAGNOSIS — I10 Essential (primary) hypertension: Secondary | ICD-10-CM | POA: Diagnosis not present

## 2022-03-21 DIAGNOSIS — I69322 Dysarthria following cerebral infarction: Secondary | ICD-10-CM | POA: Diagnosis not present

## 2022-03-21 DIAGNOSIS — M109 Gout, unspecified: Secondary | ICD-10-CM | POA: Diagnosis not present

## 2022-03-21 DIAGNOSIS — I69354 Hemiplegia and hemiparesis following cerebral infarction affecting left non-dominant side: Secondary | ICD-10-CM | POA: Diagnosis not present

## 2022-03-21 DIAGNOSIS — K5900 Constipation, unspecified: Secondary | ICD-10-CM | POA: Diagnosis not present

## 2022-03-21 DIAGNOSIS — F17210 Nicotine dependence, cigarettes, uncomplicated: Secondary | ICD-10-CM | POA: Diagnosis not present

## 2022-03-23 DIAGNOSIS — I639 Cerebral infarction, unspecified: Secondary | ICD-10-CM | POA: Diagnosis not present

## 2022-03-23 DIAGNOSIS — H04123 Dry eye syndrome of bilateral lacrimal glands: Secondary | ICD-10-CM | POA: Diagnosis not present

## 2022-03-23 DIAGNOSIS — H1045 Other chronic allergic conjunctivitis: Secondary | ICD-10-CM | POA: Diagnosis not present

## 2022-03-23 DIAGNOSIS — H2513 Age-related nuclear cataract, bilateral: Secondary | ICD-10-CM | POA: Diagnosis not present

## 2022-03-23 DIAGNOSIS — E119 Type 2 diabetes mellitus without complications: Secondary | ICD-10-CM | POA: Diagnosis not present

## 2022-03-27 DIAGNOSIS — I69391 Dysphagia following cerebral infarction: Secondary | ICD-10-CM | POA: Diagnosis not present

## 2022-03-27 DIAGNOSIS — N179 Acute kidney failure, unspecified: Secondary | ICD-10-CM | POA: Diagnosis not present

## 2022-03-27 DIAGNOSIS — Z7901 Long term (current) use of anticoagulants: Secondary | ICD-10-CM | POA: Diagnosis not present

## 2022-03-27 DIAGNOSIS — I480 Paroxysmal atrial fibrillation: Secondary | ICD-10-CM | POA: Diagnosis not present

## 2022-03-27 DIAGNOSIS — Z85038 Personal history of other malignant neoplasm of large intestine: Secondary | ICD-10-CM | POA: Diagnosis not present

## 2022-03-27 DIAGNOSIS — M109 Gout, unspecified: Secondary | ICD-10-CM | POA: Diagnosis not present

## 2022-03-27 DIAGNOSIS — K5900 Constipation, unspecified: Secondary | ICD-10-CM | POA: Diagnosis not present

## 2022-03-27 DIAGNOSIS — E785 Hyperlipidemia, unspecified: Secondary | ICD-10-CM | POA: Diagnosis not present

## 2022-03-27 DIAGNOSIS — M1990 Unspecified osteoarthritis, unspecified site: Secondary | ICD-10-CM | POA: Diagnosis not present

## 2022-03-27 DIAGNOSIS — I10 Essential (primary) hypertension: Secondary | ICD-10-CM | POA: Diagnosis not present

## 2022-03-27 DIAGNOSIS — I69322 Dysarthria following cerebral infarction: Secondary | ICD-10-CM | POA: Diagnosis not present

## 2022-03-27 DIAGNOSIS — F17210 Nicotine dependence, cigarettes, uncomplicated: Secondary | ICD-10-CM | POA: Diagnosis not present

## 2022-03-27 DIAGNOSIS — E119 Type 2 diabetes mellitus without complications: Secondary | ICD-10-CM | POA: Diagnosis not present

## 2022-03-27 DIAGNOSIS — G4700 Insomnia, unspecified: Secondary | ICD-10-CM | POA: Diagnosis not present

## 2022-03-27 DIAGNOSIS — I471 Supraventricular tachycardia: Secondary | ICD-10-CM | POA: Diagnosis not present

## 2022-03-27 DIAGNOSIS — I69354 Hemiplegia and hemiparesis following cerebral infarction affecting left non-dominant side: Secondary | ICD-10-CM | POA: Diagnosis not present

## 2022-03-28 DIAGNOSIS — R195 Other fecal abnormalities: Secondary | ICD-10-CM | POA: Diagnosis not present

## 2022-03-28 DIAGNOSIS — K5900 Constipation, unspecified: Secondary | ICD-10-CM | POA: Diagnosis not present

## 2022-03-28 DIAGNOSIS — R0982 Postnasal drip: Secondary | ICD-10-CM | POA: Diagnosis not present

## 2022-03-28 DIAGNOSIS — J302 Other seasonal allergic rhinitis: Secondary | ICD-10-CM | POA: Diagnosis not present

## 2022-03-28 DIAGNOSIS — K921 Melena: Secondary | ICD-10-CM | POA: Diagnosis not present

## 2022-04-02 DIAGNOSIS — I480 Paroxysmal atrial fibrillation: Secondary | ICD-10-CM | POA: Diagnosis not present

## 2022-04-02 DIAGNOSIS — G4700 Insomnia, unspecified: Secondary | ICD-10-CM | POA: Diagnosis not present

## 2022-04-02 DIAGNOSIS — I69322 Dysarthria following cerebral infarction: Secondary | ICD-10-CM | POA: Diagnosis not present

## 2022-04-02 DIAGNOSIS — I69391 Dysphagia following cerebral infarction: Secondary | ICD-10-CM | POA: Diagnosis not present

## 2022-04-02 DIAGNOSIS — I69354 Hemiplegia and hemiparesis following cerebral infarction affecting left non-dominant side: Secondary | ICD-10-CM | POA: Diagnosis not present

## 2022-04-02 DIAGNOSIS — I471 Supraventricular tachycardia: Secondary | ICD-10-CM | POA: Diagnosis not present

## 2022-04-02 DIAGNOSIS — E119 Type 2 diabetes mellitus without complications: Secondary | ICD-10-CM | POA: Diagnosis not present

## 2022-04-02 DIAGNOSIS — M109 Gout, unspecified: Secondary | ICD-10-CM | POA: Diagnosis not present

## 2022-04-02 DIAGNOSIS — N179 Acute kidney failure, unspecified: Secondary | ICD-10-CM | POA: Diagnosis not present

## 2022-04-02 DIAGNOSIS — F17210 Nicotine dependence, cigarettes, uncomplicated: Secondary | ICD-10-CM | POA: Diagnosis not present

## 2022-04-02 DIAGNOSIS — M1990 Unspecified osteoarthritis, unspecified site: Secondary | ICD-10-CM | POA: Diagnosis not present

## 2022-04-02 DIAGNOSIS — E785 Hyperlipidemia, unspecified: Secondary | ICD-10-CM | POA: Diagnosis not present

## 2022-04-02 DIAGNOSIS — K5900 Constipation, unspecified: Secondary | ICD-10-CM | POA: Diagnosis not present

## 2022-04-02 DIAGNOSIS — Z7901 Long term (current) use of anticoagulants: Secondary | ICD-10-CM | POA: Diagnosis not present

## 2022-04-02 DIAGNOSIS — I10 Essential (primary) hypertension: Secondary | ICD-10-CM | POA: Diagnosis not present

## 2022-04-02 DIAGNOSIS — Z85038 Personal history of other malignant neoplasm of large intestine: Secondary | ICD-10-CM | POA: Diagnosis not present

## 2022-04-03 ENCOUNTER — Inpatient Hospital Stay: Payer: Medicare Other

## 2022-04-03 ENCOUNTER — Encounter: Payer: Self-pay | Admitting: Student

## 2022-04-03 ENCOUNTER — Ambulatory Visit: Payer: Medicare Other | Admitting: Cardiology

## 2022-04-03 ENCOUNTER — Ambulatory Visit: Payer: Medicare Other | Admitting: Student

## 2022-04-03 VITALS — BP 137/83 | HR 84 | Temp 98.2°F | Resp 17 | Ht 75.0 in | Wt 248.0 lb

## 2022-04-03 DIAGNOSIS — Z8673 Personal history of transient ischemic attack (TIA), and cerebral infarction without residual deficits: Secondary | ICD-10-CM

## 2022-04-03 DIAGNOSIS — I480 Paroxysmal atrial fibrillation: Secondary | ICD-10-CM | POA: Diagnosis not present

## 2022-04-03 DIAGNOSIS — I10 Essential (primary) hypertension: Secondary | ICD-10-CM

## 2022-04-03 DIAGNOSIS — I4729 Other ventricular tachycardia: Secondary | ICD-10-CM | POA: Diagnosis not present

## 2022-04-03 NOTE — Progress Notes (Signed)
EKG

## 2022-04-03 NOTE — Progress Notes (Incomplete)
? ?Follow up visit ? ?Subjective:  ? ?[name], male    DOB: September 22, 1956, 66 y.o.   MRN: [id number] ? ? ? ?*** ?HPI ? ?No chief complaint on file. ? ? ?66 y.o. *** male with *** ? ?*** ? ?*** ? ?Current Outpatient Medications:  ??  acetaminophen (TYLENOL) 500 MG tablet, Take 500 mg by mouth every 6 (six) hours as needed., Disp: , Rfl:  ??  allopurinol (ZYLOPRIM) 100 MG tablet, Take 100 mg by mouth daily., Disp: , Rfl:  ??  amLODipine (NORVASC) 10 MG tablet, Take 1 tablet (10 mg total) by mouth daily with lunch., Disp: , Rfl:  ??  apixaban (ELIQUIS) 5 MG TABS tablet, Take 1 tablet (5 mg total) by mouth 2 (two) times daily., Disp: 60 tablet, Rfl: 2 ??  atorvastatin (LIPITOR) 40 MG tablet, Take 1 tablet (40 mg total) by mouth daily., Disp: 30 tablet, Rfl: 2 ??  folic acid (FOLVITE) 1 MG tablet, Take 1 tablet (1 mg total) by mouth daily., Disp: 30 tablet, Rfl: 1 ??  metoprolol succinate (TOPROL-XL) 50 MG 24 hr tablet, Take 50 mg by mouth daily. , Disp: , Rfl:  ??  Multiple Vitamin (MULTIVITAMIN WITH MINERALS) TABS tablet, Take 1 tablet by mouth daily., Disp: , Rfl:  ??  nicotine (NICODERM CQ - DOSED IN MG/24 HOURS) 14 mg/24hr patch, Place 1 patch (14 mg total) onto the skin daily., Disp: 28 patch, Rfl: 0 ??  omega-3 acid ethyl esters (LOVAZA) 1 G capsule, Take 2 g by mouth daily. , Disp: , Rfl:  ??  ONE TOUCH ULTRA TEST test strip, USE 1 STRIP TO CHECK GLUCOSE TWICE DAILY, Disp: , Rfl:  ??  OZEMPIC, 0.25 OR 0.5 MG/DOSE, 2 MG/1.5ML SOPN, Inject 0.25 mg into the skin once a week. Tuesdays, Disp: , Rfl:  ??  Polyvinyl Alcohol-Povidone (REFRESH OP), Place 1 drop into both eyes 2 (two) times daily as needed (dry eye)., Disp: , Rfl:  ??  tadalafil (CIALIS) 20 MG tablet, Take 20 mg by mouth See admin instructions. Every 3 days as needed for erectile dysfunction, Disp: , Rfl:  ??  thiamine 100 MG tablet, Take 1 tablet (100 mg total) by mouth daily., Disp: , Rfl:   ? ?Cardiovascular & other pertient studies: ? ?Reviewed  external labs and tests, independently interpreted ? ?*** ?EKG ***/***/202***: ?*** ? ?Echocardiogram 03/16/2022: ?1. Left ventricular ejection fraction, by estimation, is 60 to 65%. The  ?left ventricle has normal function. The left ventricle has no regional  ?wall motion abnormalities. Left ventricular diastolic function could not  ?be evaluated.  ? 2. Right ventricular systolic function is normal. The right ventricular  ?size is normal.  ? 3. The mitral valve is normal in structure. Trivial mitral valve  ?regurgitation.  ? 4. The aortic valve is normal in structure. Aortic valve regurgitation is  ?not visualized. No aortic stenosis is present.  ? ?Exercise nuclear stress test 11/22/2021: ?Normal ECG stress. The patient exercised for 7 minutes and 20 seconds of a Bruce protocol, achieving approximately 8.05 METs.  The blood pressure response was normal. ?Myocardial perfusion is normal. ?Overall LV systolic function is normal without regional wall motion abnormalities. Stress LV EF: 62%.  ?No previous exam available for comparison. Low risk.  ?  ? ?Recent labs: ?***/***/202***: ?Glucose ***, BUN/Cr ***/***. EGFR ***. Na/K ***/***. ***Rest of the CMP normal ?H/H ***/***. MCV ***. Platelets *** ?***HbA1C ***% ?Chol ***, TG ***, HDL ***, LDL *** ?***TSH ***normal ? ? ?*** ?  ROS ? ?   ? ?*** ?There were no vitals filed for this visit. ? ?There is no height or weight on file to calculate BMI. ?There were no vitals filed for this visit. ? ?*** ?Objective:  ? Physical Exam ? ? ? ? ?   ? ? ?Visit diagnoses: ?No diagnosis found.  ? ?No orders of the defined types were placed in this encounter. ? ? ? ?Medication changes this visit: ?There are no discontinued medications.  ?No orders of the defined types were placed in this encounter. ?  ? ?Assessment & Recommendations:  ? ?*** ? ? ? ? ? ? ?Nigel Mormon, MD ?Pager: [phone] ?Office: [phone] ?

## 2022-04-03 NOTE — Progress Notes (Signed)
? ?Primary Physician/Referring:  [name], MD ? ?Patient ID: Gary Frederick, male    DOB: Jan 21, 1956, 66 y.o.   MRN: [id number] ? ?Chief Complaint  ?Patient presents with  ? Follow-up  ? Hospitalization Follow-up  ? Hyperlipidemia  ? Hypertension  ? ?HPI:   ? ?Gary Frederick  is a 66 y.o. AA male with history of hypertension, hyperlipidemia, type 2 diabetes mellitus, active smoker (1 ppd), and history of colon caner s/p resection in 2014. Patient was referred to our office for evaluation and management of atrial flutter. ? ?Patient had been started on Eliquis by PCP and recommended to continue this.  Patient underwent 2-day cardiac monitor which revealed paroxysmal atrial fibrillation/flutter, he was advised to continue long-term anticoagulation however unfortunately following last office visit during further cardiac evaluation patient was noncompliant with Eliquis.  He subsequently presented to emergency department 03/15/2022 with slurred speech, facial droop, and left arm numbness.  CT of the head at that time noted acute cortical/subcortical right MCA infarct with additional small cortical/subcortical infarcts within the right occipital lobe. ? ?During hospitalization patient was evaluated by neurology and restarted on Eliquis.  There was also concern for episodes of nonsustained ventricular tachycardia for which cardiology was consulted.  Dr. Wynonia Lawman saw patient in the hospital and personally reviewed telemetry, episodes concerning for nonsustained ventricular tachycardia for actually baseline artifact.  Patient was discharged home with high intensity statin therapy, Eliquis. ? ?Patient now presents for hospital follow-up.  Patient has residual mildly slurred speech and left facial droop.  However he is feeling well overall.  He reports he has not consumed alcohol since admission to the hospital.  He is tolerating anticoagulation without bleeding diathesis and is eager to return to mowing his yard.   Denies chest pain, dyspnea, palpitations, dizziness.  He does report fatigue. ? ?Past Medical History:  ?Diagnosis Date  ? Arthritis   ? Cancer ([place]) 10/2012  ? colon  ? Constipation   ? Diabetes mellitus without complication ([place])   ? Gout   ? Hyperlipidemia   ? Hypertension   ? Insomnia   ? ?Past Surgical History:  ?Procedure Laterality Date  ? CARPAL TUNNEL RELEASE    ? right  ? COLON RESECTION  12/11/2012  ? Procedure: LAPAROSCOPIC RIGHT COLON RESECTION;  Surgeon: Leighton Ruff, MD;  Location: [facility];  Service: General;  Laterality: Right;  ? COLON SURGERY  12/11/2012  ? LIVER ULTRASOUND  12/11/2012  ? Procedure: LIVER ULTRASOUND;  Surgeon: Leighton Ruff, MD;  Location: [facility];  Service: General;  Laterality: Right;  Intraoperative Liver Ultrasound  ? ?Family History  ?Problem Relation Age of Onset  ? Diabetes Mother   ? Stroke Mother   ? Huntington's disease Father   ? Hypertension Brother   ?  ?Social History  ? ?Tobacco Use  ? Smoking status: Every Day  ?  Packs/day: 1.00  ?  Years: 30.00  ?  Pack years: 30.00  ?  Types: Cigarettes  ? Smokeless tobacco: Never  ?Substance Use Topics  ? Alcohol use: Yes  ?  Alcohol/week: 5.0 standard drinks  ?  Types: 5 Cans of beer per week  ? ?Marital Status: Married  ? ?ROS  ?Review of Systems  ?Constitutional: Negative for malaise/fatigue and weight gain.  ?Cardiovascular:  Negative for chest pain, claudication, leg swelling, near-syncope, orthopnea, paroxysmal nocturnal dyspnea and syncope.  ?Respiratory:  Negative for shortness of breath.   ?Neurological:  Negative for dizziness.  ? ?Objective  ?Blood pressure 137/83,  pulse 84, temperature 98.2 ?F (36.8 ?C), temperature source Temporal, resp. rate 17, height '6\' 3"'$  (1.905 m), weight 248 lb (112.5 kg), SpO2 96 %.  ? ?  04/03/2022  ? 12:25 PM 03/17/2022  ?  3:31 PM 03/17/2022  ? 12:40 PM  ?Vitals with BMI  ?Height '6\' 3"'$     ?Weight 248 lbs    ?BMI 31    ?Systolic [phone]  ?Diastolic 83 97 94  ?Pulse 84 66 68  ?  ? Physical  Exam ?Vitals reviewed.  ?Cardiovascular:  ?   Rate and Rhythm: Normal rate and regular rhythm.  ?   Pulses: Intact distal pulses.  ?   Heart sounds: S1 normal and S2 normal. No murmur heard. ?  No gallop.  ?Pulmonary:  ?   Effort: Pulmonary effort is normal. No respiratory distress.  ?   Breath sounds: No wheezing, rhonchi or rales.  ?Musculoskeletal:  ?   Right lower leg: No edema.  ?   Left lower leg: No edema.  ?Neurological:  ?   Mental Status: He is alert.  ?   Comments: Left facial droop and mild slurred speech residual from CVA in 03/2022  ? ?Laboratory examination:  ? ?Recent Labs  ?  03/16/22 ?0024 03/16/22 ?6503 03/17/22 ?0149  ?NA 135 138 140  ?K 3.1* 3.6 3.5  ?CL 103 105 107  ?CO2 '24 26 27  '$ ?GLUCOSE 119* 130* 99  ?BUN 12 9 7*  ?CREATININE 0.95 0.96 0.88  ?CALCIUM 8.8* 8.8* 8.9  ?GFRNONAA >60 >60 >60  ? ?estimated creatinine clearance is 114.8 mL/min (by C-G formula based on SCr of 0.88 mg/dL).  ? ?  Latest Ref Rng & Units 03/17/2022  ?  1:49 AM 03/16/2022  ?  9:11 AM 03/16/2022  ? 12:24 AM  ?CMP  ?Glucose 70 - 99 mg/dL 99   130   119    ?BUN 8 - 23 mg/dL '7   9   12    '$ ?Creatinine 0.61 - 1.24 mg/dL 0.88   0.96   0.95    ?Sodium 135 - 145 mmol/L 140   138   135    ?Potassium 3.5 - 5.1 mmol/L 3.5   3.6   3.1    ?Chloride 98 - 111 mmol/L 107   105   103    ?CO2 22 - 32 mmol/L '27   26   24    '$ ?Calcium 8.9 - 10.3 mg/dL 8.9   8.8   8.8    ? ? ?  Latest Ref Rng & Units 03/16/2022  ? 12:24 AM 03/15/2022  ? 11:21 AM 03/15/2022  ? 11:10 AM  ?CBC  ?WBC 4.0 - 10.5 K/uL 9.6    11.1    ?Hemoglobin 13.0 - 17.0 g/dL 14.6   16.3   15.8    ?Hematocrit 39.0 - 52.0 % 41.8   48.0   46.2    ?Platelets 150 - 400 K/uL 194    214    ? ? ?Lipid Panel ?Recent Labs  ?  03/16/22 ?0024  ?CHOL 181  ?TRIG 189*  ?LDLCALC 115*  ?VLDL 38  ?HDL 28*  ?CHOLHDL 6.5  ? ? ?HEMOGLOBIN A1C ?Lab Results  ?Component Value Date  ? HGBA1C 6.6 (H) 03/16/2022  ? MPG 142.72 03/16/2022  ? ?TSH ?Recent Labs  ?  03/16/22 ?0024  ?TSH 1.218  ? ? ?External labs:   ?None ? ?Allergies  ?No Known Allergies  ? ?Medications Prior to Visit:  ? ?  Outpatient Medications Prior to Visit  ?Medication Sig Dispense Refill  ? acetaminophen (TYLENOL) 500 MG tablet Take 500 mg by mouth every 6 (six) hours as needed.    ? allopurinol (ZYLOPRIM) 100 MG tablet Take 100 mg by mouth daily.    ? amLODipine (NORVASC) 10 MG tablet Take 1 tablet (10 mg total) by mouth daily with lunch.    ? apixaban (ELIQUIS) 5 MG TABS tablet Take 1 tablet (5 mg total) by mouth 2 (two) times daily. 60 tablet 2  ? atorvastatin (LIPITOR) 40 MG tablet Take 1 tablet (40 mg total) by mouth daily. 30 tablet 2  ? esomeprazole (NEXIUM) 20 MG packet Take 20 mg by mouth daily before breakfast.    ? fluticasone (FLONASE) 50 MCG/ACT nasal spray SMARTSIG:2 Spray(s) Both Nares PRN    ? folic acid (FOLVITE) 1 MG tablet Take 1 tablet (1 mg total) by mouth daily. 30 tablet 1  ? metoprolol succinate (TOPROL-XL) 50 MG 24 hr tablet Take 50 mg by mouth daily.     ? Multiple Vitamin (MULTIVITAMIN WITH MINERALS) TABS tablet Take 1 tablet by mouth daily.    ? omega-3 acid ethyl esters (LOVAZA) 1 G capsule Take 2 g by mouth daily.     ? ONE TOUCH ULTRA TEST test strip USE 1 STRIP TO CHECK GLUCOSE TWICE DAILY    ? OZEMPIC, 0.25 OR 0.5 MG/DOSE, 2 MG/1.5ML SOPN Inject 0.25 mg into the skin once a week. Tuesdays    ? Polyvinyl Alcohol-Povidone (REFRESH OP) Place 1 drop into both eyes 2 (two) times daily as needed (dry eye).    ? psyllium (REGULOID) 0.52 g capsule Take 0.52 g by mouth daily.    ? vitamin B-12 (CYANOCOBALAMIN) 250 MCG tablet Take 250 mcg by mouth daily.    ? nicotine (NICODERM CQ - DOSED IN MG/24 HOURS) 14 mg/24hr patch Place 1 patch (14 mg total) onto the skin daily. (Patient not taking: Reported on 04/03/2022) 28 patch 0  ? tadalafil (CIALIS) 20 MG tablet Take 20 mg by mouth See admin instructions. Every 3 days as needed for erectile dysfunction    ? thiamine 100 MG tablet Take 1 tablet (100 mg total) by mouth daily.    ? ?No  facility-administered medications prior to visit.  ? ?Final Medications at End of Visit   ? ?Current Meds  ?Medication Sig  ? acetaminophen (TYLENOL) 500 MG tablet Take 500 mg by mouth every 6 (six) hours as needed.

## 2022-04-04 DIAGNOSIS — Z0000 Encounter for general adult medical examination without abnormal findings: Secondary | ICD-10-CM | POA: Diagnosis not present

## 2022-04-04 DIAGNOSIS — K921 Melena: Secondary | ICD-10-CM | POA: Diagnosis not present

## 2022-04-05 ENCOUNTER — Ambulatory Visit: Payer: Medicare Other | Admitting: Cardiology

## 2022-04-09 ENCOUNTER — Other Ambulatory Visit: Payer: Medicare Other

## 2022-04-09 DIAGNOSIS — K5900 Constipation, unspecified: Secondary | ICD-10-CM | POA: Diagnosis not present

## 2022-04-09 DIAGNOSIS — K625 Hemorrhage of anus and rectum: Secondary | ICD-10-CM | POA: Diagnosis not present

## 2022-04-09 DIAGNOSIS — I6789 Other cerebrovascular disease: Secondary | ICD-10-CM | POA: Diagnosis not present

## 2022-04-10 NOTE — Telephone Encounter (Signed)
Error

## 2022-04-12 ENCOUNTER — Other Ambulatory Visit: Payer: Medicare Other

## 2022-04-16 DIAGNOSIS — Z85038 Personal history of other malignant neoplasm of large intestine: Secondary | ICD-10-CM | POA: Diagnosis not present

## 2022-04-16 DIAGNOSIS — I69322 Dysarthria following cerebral infarction: Secondary | ICD-10-CM | POA: Diagnosis not present

## 2022-04-16 DIAGNOSIS — F17210 Nicotine dependence, cigarettes, uncomplicated: Secondary | ICD-10-CM | POA: Diagnosis not present

## 2022-04-16 DIAGNOSIS — I69354 Hemiplegia and hemiparesis following cerebral infarction affecting left non-dominant side: Secondary | ICD-10-CM | POA: Diagnosis not present

## 2022-04-16 DIAGNOSIS — I10 Essential (primary) hypertension: Secondary | ICD-10-CM | POA: Diagnosis not present

## 2022-04-16 DIAGNOSIS — I480 Paroxysmal atrial fibrillation: Secondary | ICD-10-CM | POA: Diagnosis not present

## 2022-04-16 DIAGNOSIS — Z8673 Personal history of transient ischemic attack (TIA), and cerebral infarction without residual deficits: Secondary | ICD-10-CM | POA: Diagnosis not present

## 2022-04-16 DIAGNOSIS — K5900 Constipation, unspecified: Secondary | ICD-10-CM | POA: Diagnosis not present

## 2022-04-16 DIAGNOSIS — N179 Acute kidney failure, unspecified: Secondary | ICD-10-CM | POA: Diagnosis not present

## 2022-04-16 DIAGNOSIS — E785 Hyperlipidemia, unspecified: Secondary | ICD-10-CM | POA: Diagnosis not present

## 2022-04-16 DIAGNOSIS — E119 Type 2 diabetes mellitus without complications: Secondary | ICD-10-CM | POA: Diagnosis not present

## 2022-04-16 DIAGNOSIS — M1990 Unspecified osteoarthritis, unspecified site: Secondary | ICD-10-CM | POA: Diagnosis not present

## 2022-04-16 DIAGNOSIS — G4700 Insomnia, unspecified: Secondary | ICD-10-CM | POA: Diagnosis not present

## 2022-04-16 DIAGNOSIS — I69391 Dysphagia following cerebral infarction: Secondary | ICD-10-CM | POA: Diagnosis not present

## 2022-04-16 DIAGNOSIS — I471 Supraventricular tachycardia: Secondary | ICD-10-CM | POA: Diagnosis not present

## 2022-04-16 DIAGNOSIS — M109 Gout, unspecified: Secondary | ICD-10-CM | POA: Diagnosis not present

## 2022-04-16 DIAGNOSIS — Z7901 Long term (current) use of anticoagulants: Secondary | ICD-10-CM | POA: Diagnosis not present

## 2022-04-17 DIAGNOSIS — I4729 Other ventricular tachycardia: Secondary | ICD-10-CM | POA: Diagnosis not present

## 2022-04-17 DIAGNOSIS — I480 Paroxysmal atrial fibrillation: Secondary | ICD-10-CM | POA: Diagnosis not present

## 2022-04-17 DIAGNOSIS — I10 Essential (primary) hypertension: Secondary | ICD-10-CM | POA: Diagnosis not present

## 2022-04-17 DIAGNOSIS — Z8673 Personal history of transient ischemic attack (TIA), and cerebral infarction without residual deficits: Secondary | ICD-10-CM | POA: Diagnosis not present

## 2022-04-17 NOTE — Progress Notes (Signed)
Called pt no answer left a vm

## 2022-04-18 ENCOUNTER — Encounter: Payer: Self-pay | Admitting: Adult Health

## 2022-04-18 ENCOUNTER — Ambulatory Visit (INDEPENDENT_AMBULATORY_CARE_PROVIDER_SITE_OTHER): Payer: Medicare Other | Admitting: Adult Health

## 2022-04-18 VITALS — BP 134/86 | HR 83 | Ht 75.0 in | Wt 246.0 lb

## 2022-04-18 DIAGNOSIS — I63411 Cerebral infarction due to embolism of right middle cerebral artery: Secondary | ICD-10-CM

## 2022-04-18 DIAGNOSIS — I480 Paroxysmal atrial fibrillation: Secondary | ICD-10-CM | POA: Diagnosis not present

## 2022-04-18 DIAGNOSIS — R0683 Snoring: Secondary | ICD-10-CM

## 2022-04-18 NOTE — Progress Notes (Signed)
PATIENT: Gary Frederick DOB: 1956-09-06  REASON FOR VISIT: follow up HISTORY FROM: patient PRIMARY NEUROLOGIST: Dr. Leonie Man  Chief Complaint  Patient presents with   Follow-up    Pt in 9  pt is here for stroke follow up    pt states he drools a little bit . Pt states that swallowing is better.  Pt has mouth paralysis to left  Pt states he still has questions why he had the stroke     HISTORY OF PRESENT ILLNESS: Today 04/18/22  ( History from hospital):Gary Frederick is a 66 y.o. male with history of DM2, HTN, HLD, tobacco abuse, ETOH abuse, cannabis use, gout and colon cancer in remission presenting with right sided facial droop and dysarthria which were noted to be present when he awoke at 0400.  He presented to the ED later that morning and was found to have a right MCA territory stroke.  Stroke:  right MCA branch, PCA and cerebellar infarcts likely secondary atrial fibrillation not taking eliquis Code Stroke CT head acute cortical/subcortical infarcts in right MCA territory and occipital lobe ASPECTS 8 CTA head & neck negative for LVO, moderate calcific stenosis of bilateral supraclinoid carotids MRI  acute infarcts in right MCA, PCA and right cerebellar artery territories with MCA and PCA territory petechial hemorrhage 2D Echo EF 60-65%, normal atrial septum LDL 115 HgbA1c 6.6 aspirin 81 mg daily prior to admission, now on eliquis   Today:  Still has a left facial droop. Dysarthria. Eating regular. Seeing speech therapy. Reports that he smoke cigarettes on occasion before hospital was doing a pack a day. No ETOH since hospital.  Trying to exercise and lift weights. Will see his cardiologist on Friday.  Reports that his wife had told him he snores.  Doesn't really feel tried since stroke but prior to stroke he does fill tried throughout the day. Gets  up multiple times at night to urinate.     REVIEW OF SYSTEMS: Out of a complete 14 system review of symptoms, the  patient complains only of the following symptoms, and all other reviewed systems are negative.  ALLERGIES: No Known Allergies  HOME MEDICATIONS: Outpatient Medications Prior to Visit  Medication Sig Dispense Refill   acetaminophen (TYLENOL) 500 MG tablet Take 500 mg by mouth every 6 (six) hours as needed.     allopurinol (ZYLOPRIM) 100 MG tablet Take 100 mg by mouth daily.     amLODipine (NORVASC) 10 MG tablet Take 1 tablet (10 mg total) by mouth daily with lunch.     apixaban (ELIQUIS) 5 MG TABS tablet Take 1 tablet (5 mg total) by mouth 2 (two) times daily. 60 tablet 2   atorvastatin (LIPITOR) 40 MG tablet Take 1 tablet (40 mg total) by mouth daily. 30 tablet 2   esomeprazole (NEXIUM) 20 MG packet Take 20 mg by mouth daily before breakfast.     fluticasone (FLONASE) 50 MCG/ACT nasal spray SMARTSIG:2 Spray(s) Both Nares PRN     metoprolol succinate (TOPROL-XL) 50 MG 24 hr tablet Take 50 mg by mouth daily.      Multiple Vitamin (MULTIVITAMIN WITH MINERALS) TABS tablet Take 1 tablet by mouth daily.     nicotine (NICODERM CQ - DOSED IN MG/24 HOURS) 14 mg/24hr patch Place 1 patch (14 mg total) onto the skin daily. 28 patch 0   omega-3 acid ethyl esters (LOVAZA) 1 G capsule Take 2 g by mouth daily.      ONE TOUCH ULTRA TEST test  strip USE 1 STRIP TO CHECK GLUCOSE TWICE DAILY     OZEMPIC, 0.25 OR 0.5 MG/DOSE, 2 MG/1.5ML SOPN Inject 0.25 mg into the skin once a week. Tuesdays     Polyvinyl Alcohol-Povidone (REFRESH OP) Place 1 drop into both eyes 2 (two) times daily as needed (dry eye).     Psyllium (METAMUCIL PO) Metamucil     folic acid (FOLVITE) 1 MG tablet Take 1 tablet (1 mg total) by mouth daily. (Patient not taking: Reported on 04/18/2022) 30 tablet 1   psyllium (REGULOID) 0.52 g capsule Take 0.52 g by mouth daily.     vitamin B-12 (CYANOCOBALAMIN) 250 MCG tablet Take 250 mcg by mouth daily. (Patient not taking: Reported on 04/18/2022)     No facility-administered medications prior to  visit.    PAST MEDICAL HISTORY: Past Medical History:  Diagnosis Date   Arthritis    Cancer ([place]) 10/2012   colon   Constipation    Diabetes mellitus without complication ([place])    Gout    Hyperlipidemia    Hypertension    Insomnia     PAST SURGICAL HISTORY: Past Surgical History:  Procedure Laterality Date   CARPAL TUNNEL RELEASE     right   COLON RESECTION  12/11/2012   Procedure: LAPAROSCOPIC RIGHT COLON RESECTION;  Surgeon: Leighton Ruff, MD;  Location: [facility];  Service: General;  Laterality: Right;   COLON SURGERY  12/11/2012   LIVER ULTRASOUND  12/11/2012   Procedure: LIVER ULTRASOUND;  Surgeon: Leighton Ruff, MD;  Location: [facility];  Service: General;  Laterality: Right;  Intraoperative Liver Ultrasound    FAMILY HISTORY: Family History  Problem Relation Age of Onset   Diabetes Mother    Stroke Mother    Huntington's disease Father    Hypertension Brother     SOCIAL HISTORY: Social History   Socioeconomic History   Marital status: Married    Spouse name: Secondary school teacher   Number of children: 4   Years of education: Not on file   Highest education level: Not on file  Occupational History   Occupation: Trucking    Comment: Disabled  Tobacco Use   Smoking status: Every Day    Packs/day: 0.50    Years: 30.00    Pack years: 15.00    Types: Cigarettes   Smokeless tobacco: Never  Vaping Use   Vaping Use: Never used  Substance and Sexual Activity   Alcohol use: Yes    Alcohol/week: 5.0 standard drinks    Types: 5 Cans of beer per week   Drug use: No   Sexual activity: Not on file  Other Topics Concern   Not on file  Social History Narrative   Not on file   Social Determinants of Health   Financial Resource Strain: Not on file  Food Insecurity: Not on file  Transportation Needs: Not on file  Physical Activity: Not on file  Stress: Not on file  Social Connections: Not on file  Intimate Partner Violence: Not on file      PHYSICAL EXAM  Vitals:    04/18/22 0845  BP: 134/86  Pulse: 83  Weight: 246 lb (111.6 kg)  Height: '6\' 3"'$  (1.905 m)   Body mass index is 30.75 kg/m.  Generalized: Well developed, in no acute distress   Neurological examination  Mentation: Alert oriented to time, place, history taking. Follows all commands speech is dysarthric.  Cranial nerve II-XII: Pupils were equal round reactive to light. Extraocular movements were full, visual field were  full on confrontational test. Facial sensation and strength were normal. Head turning and shoulder shrug  were normal and symmetric.left facial droop. Mallampati 4+, neck circumference 17.5 inches Motor: The motor testing reveals 5 over 5 strength of all 4 extremities. Good symmetric motor tone is noted throughout.  Sensory: Sensory testing is intact to soft touch on all 4 extremities. No evidence of extinction is noted.  Coordination: Cerebellar testing reveals good finger-nose-finger and heel-to-shin bilaterally.  Gait and station: Gait is normal.  Romberg is negative. No drift is seen.  Reflexes: Deep tendon reflexes are symmetric and normal bilaterally.   DIAGNOSTIC DATA (LABS, IMAGING, TESTING) - I reviewed patient records, labs, notes, testing and imaging myself where available.  Lab Results  Component Value Date   WBC 9.6 03/16/2022   HGB 14.6 03/16/2022   HCT 41.8 03/16/2022   MCV 89.1 03/16/2022   PLT 194 03/16/2022      Component Value Date/Time   NA 140 03/17/2022 0149   K 3.5 03/17/2022 0149   CL 107 03/17/2022 0149   CO2 27 03/17/2022 0149   GLUCOSE 99 03/17/2022 0149   BUN 7 (L) 03/17/2022 0149   CREATININE 0.88 03/17/2022 0149   CREATININE 0.91 03/25/2013 1512   CALCIUM 8.9 03/17/2022 0149   PROT 7.3 03/15/2022 1110   ALBUMIN 4.0 03/15/2022 1110   AST 31 03/15/2022 1110   ALT 34 03/15/2022 1110   ALKPHOS 43 03/15/2022 1110   BILITOT 0.7 03/15/2022 1110   GFRNONAA >60 03/17/2022 0149   GFRAA >60 02/19/2020 0452   Lab Results  Component  Value Date   CHOL 181 03/16/2022   HDL 28 (L) 03/16/2022   LDLCALC 115 (H) 03/16/2022   TRIG 189 (H) 03/16/2022   CHOLHDL 6.5 03/16/2022   Lab Results  Component Value Date   HGBA1C 6.6 (H) 03/16/2022   No results found for: [id number] Lab Results  Component Value Date   TSH 1.218 03/16/2022      ASSESSMENT AND PLAN 66 y.o. year old male  has a past medical history of Arthritis, Cancer ([place]) (10/2012), Constipation, Diabetes mellitus without complication ([place]), Gout, Hyperlipidemia, Hypertension, and Insomnia. here with:  Stroke:  right MCA branch, PCA and cerebellar infarcts likely secondary atrial fibrillation not taking eliquis   Continue Eliquis BP goal < 130/80- manage by pcp LDL goal <70- managed by pcp/cardiologist hgbA1c <6.5 %- managed by pcp Continue speech therapy Referral for sleep studies- history of CVA, AFIB, Snores Advised that if he develops any strokelike symptoms he should call 911 or go to the ED immediately. FU PRN    Ward Givens, MSN, NP-C 04/18/2022, 9:16 AM [facility] [address], [place], [place] [address] [phone]

## 2022-04-18 NOTE — Patient Instructions (Addendum)
Your Plan: ? ?Continue Eliquis ?BP goal < 130/80 ?LDL goal <70 ?hgbA1c <6.5 % ?Referral for sleep studies ?If your symptoms worsen or you develop new symptoms please let us know.  ? ? ?Thank you for coming to see [place] at [facility]. I hope we have been able to provide you high quality care today. ? ?You may receive a patient satisfaction survey over the next few weeks. We would appreciate your feedback and comments so that we may continue to improve ourselves and the health of our patients. ? ?

## 2022-04-18 NOTE — Progress Notes (Signed)
Pt aware.

## 2022-04-19 NOTE — Progress Notes (Signed)
I agree with the above plan 

## 2022-04-20 ENCOUNTER — Ambulatory Visit: Payer: Medicare Other

## 2022-04-20 ENCOUNTER — Telehealth: Payer: Self-pay | Admitting: Student

## 2022-04-20 DIAGNOSIS — Z8673 Personal history of transient ischemic attack (TIA), and cerebral infarction without residual deficits: Secondary | ICD-10-CM

## 2022-04-20 NOTE — Telephone Encounter (Signed)
When we receive the results for his carotid and call him to share those results, can someone also give him the results from his monitor (if they are ready)? Patient requested. Thank you.

## 2022-04-20 NOTE — Telephone Encounter (Signed)
Pt requesting call regarding monitor results. I reviewed them with the pt before, however I believe he may be confused and still has questions.

## 2022-04-23 NOTE — Progress Notes (Signed)
Called and spoke with patient regarding CAD results.

## 2022-04-24 DIAGNOSIS — I69322 Dysarthria following cerebral infarction: Secondary | ICD-10-CM | POA: Diagnosis not present

## 2022-04-24 DIAGNOSIS — R1310 Dysphagia, unspecified: Secondary | ICD-10-CM | POA: Diagnosis not present

## 2022-04-24 DIAGNOSIS — E1169 Type 2 diabetes mellitus with other specified complication: Secondary | ICD-10-CM | POA: Diagnosis not present

## 2022-04-24 DIAGNOSIS — I10 Essential (primary) hypertension: Secondary | ICD-10-CM | POA: Diagnosis not present

## 2022-04-24 DIAGNOSIS — I63311 Cerebral infarction due to thrombosis of right middle cerebral artery: Secondary | ICD-10-CM | POA: Diagnosis not present

## 2022-04-24 DIAGNOSIS — I480 Paroxysmal atrial fibrillation: Secondary | ICD-10-CM | POA: Diagnosis not present

## 2022-04-24 DIAGNOSIS — K625 Hemorrhage of anus and rectum: Secondary | ICD-10-CM | POA: Diagnosis not present

## 2022-04-24 DIAGNOSIS — Z7901 Long term (current) use of anticoagulants: Secondary | ICD-10-CM | POA: Diagnosis not present

## 2022-05-01 DIAGNOSIS — K5900 Constipation, unspecified: Secondary | ICD-10-CM | POA: Diagnosis not present

## 2022-05-01 DIAGNOSIS — Z7901 Long term (current) use of anticoagulants: Secondary | ICD-10-CM | POA: Diagnosis not present

## 2022-05-01 DIAGNOSIS — M109 Gout, unspecified: Secondary | ICD-10-CM | POA: Diagnosis not present

## 2022-05-01 DIAGNOSIS — I69354 Hemiplegia and hemiparesis following cerebral infarction affecting left non-dominant side: Secondary | ICD-10-CM | POA: Diagnosis not present

## 2022-05-01 DIAGNOSIS — I69391 Dysphagia following cerebral infarction: Secondary | ICD-10-CM | POA: Diagnosis not present

## 2022-05-01 DIAGNOSIS — M1990 Unspecified osteoarthritis, unspecified site: Secondary | ICD-10-CM | POA: Diagnosis not present

## 2022-05-01 DIAGNOSIS — E119 Type 2 diabetes mellitus without complications: Secondary | ICD-10-CM | POA: Diagnosis not present

## 2022-05-01 DIAGNOSIS — F17210 Nicotine dependence, cigarettes, uncomplicated: Secondary | ICD-10-CM | POA: Diagnosis not present

## 2022-05-01 DIAGNOSIS — I10 Essential (primary) hypertension: Secondary | ICD-10-CM | POA: Diagnosis not present

## 2022-05-01 DIAGNOSIS — I471 Supraventricular tachycardia: Secondary | ICD-10-CM | POA: Diagnosis not present

## 2022-05-01 DIAGNOSIS — N179 Acute kidney failure, unspecified: Secondary | ICD-10-CM | POA: Diagnosis not present

## 2022-05-01 DIAGNOSIS — I69322 Dysarthria following cerebral infarction: Secondary | ICD-10-CM | POA: Diagnosis not present

## 2022-05-01 DIAGNOSIS — Z85038 Personal history of other malignant neoplasm of large intestine: Secondary | ICD-10-CM | POA: Diagnosis not present

## 2022-05-01 DIAGNOSIS — I480 Paroxysmal atrial fibrillation: Secondary | ICD-10-CM | POA: Diagnosis not present

## 2022-05-01 DIAGNOSIS — G4700 Insomnia, unspecified: Secondary | ICD-10-CM | POA: Diagnosis not present

## 2022-05-01 DIAGNOSIS — E785 Hyperlipidemia, unspecified: Secondary | ICD-10-CM | POA: Diagnosis not present

## 2022-05-02 DIAGNOSIS — E7849 Other hyperlipidemia: Secondary | ICD-10-CM | POA: Diagnosis not present

## 2022-05-02 DIAGNOSIS — I10 Essential (primary) hypertension: Secondary | ICD-10-CM | POA: Diagnosis not present

## 2022-06-12 ENCOUNTER — Institutional Professional Consult (permissible substitution): Payer: Medicare Other | Admitting: Neurology

## 2022-06-26 DIAGNOSIS — R300 Dysuria: Secondary | ICD-10-CM | POA: Diagnosis not present

## 2022-06-26 DIAGNOSIS — I480 Paroxysmal atrial fibrillation: Secondary | ICD-10-CM | POA: Diagnosis not present

## 2022-06-26 DIAGNOSIS — E1169 Type 2 diabetes mellitus with other specified complication: Secondary | ICD-10-CM | POA: Diagnosis not present

## 2022-06-26 DIAGNOSIS — M545 Low back pain, unspecified: Secondary | ICD-10-CM | POA: Diagnosis not present

## 2022-06-26 DIAGNOSIS — I63311 Cerebral infarction due to thrombosis of right middle cerebral artery: Secondary | ICD-10-CM | POA: Diagnosis not present

## 2022-06-26 DIAGNOSIS — R1310 Dysphagia, unspecified: Secondary | ICD-10-CM | POA: Diagnosis not present

## 2022-06-26 DIAGNOSIS — I119 Hypertensive heart disease without heart failure: Secondary | ICD-10-CM | POA: Diagnosis not present

## 2022-06-27 DIAGNOSIS — I480 Paroxysmal atrial fibrillation: Secondary | ICD-10-CM | POA: Diagnosis not present

## 2022-06-27 DIAGNOSIS — I63311 Cerebral infarction due to thrombosis of right middle cerebral artery: Secondary | ICD-10-CM | POA: Diagnosis not present

## 2022-06-27 DIAGNOSIS — I69322 Dysarthria following cerebral infarction: Secondary | ICD-10-CM | POA: Diagnosis not present

## 2022-06-27 DIAGNOSIS — E1169 Type 2 diabetes mellitus with other specified complication: Secondary | ICD-10-CM | POA: Diagnosis not present

## 2022-06-27 DIAGNOSIS — R1310 Dysphagia, unspecified: Secondary | ICD-10-CM | POA: Diagnosis not present

## 2022-07-04 ENCOUNTER — Ambulatory Visit: Payer: No Typology Code available for payment source | Admitting: Student

## 2022-07-04 DIAGNOSIS — M48062 Spinal stenosis, lumbar region with neurogenic claudication: Secondary | ICD-10-CM | POA: Diagnosis not present

## 2022-07-05 ENCOUNTER — Ambulatory Visit: Payer: No Typology Code available for payment source | Admitting: Cardiology

## 2022-07-05 ENCOUNTER — Encounter: Payer: Self-pay | Admitting: Cardiology

## 2022-07-05 VITALS — BP 121/87 | HR 70 | Temp 98.0°F | Resp 16 | Ht 75.0 in | Wt 247.0 lb

## 2022-07-05 DIAGNOSIS — I480 Paroxysmal atrial fibrillation: Secondary | ICD-10-CM | POA: Diagnosis not present

## 2022-07-05 DIAGNOSIS — E782 Mixed hyperlipidemia: Secondary | ICD-10-CM | POA: Diagnosis not present

## 2022-07-05 DIAGNOSIS — F17210 Nicotine dependence, cigarettes, uncomplicated: Secondary | ICD-10-CM | POA: Insufficient documentation

## 2022-07-05 MED ORDER — NICOTINE 7 MG/24HR TD PT24: 7.0000 mg | MEDICATED_PATCH | Freq: Every day | TRANSDERMAL | 3 refills | Status: DC

## 2022-07-05 NOTE — Progress Notes (Signed)
Follow up visit  Subjective:   Gary Frederick, male    DOB: [birth year], 66 y.o.   MRN: [id number]   HPI  Chief Complaint  Patient presents with   Hypertension   Follow-up    66 year old African-American male with hypertension, hyperlipidemia, type 2 diabetes mellitus, h/o stroke with minimal residual neurodeficit, PAF, smoker, h/o alcohol abuse, cannabis use, gout, colon cancer in remission  Patient was previously seen by other providers in our office.  This is my first visit with him.  He denies any chest pain, shortness of breath, palpitation symptoms.  He is able to walk for a mile without any complaints of angina.  His pressing symptom at this time is back pain for which, he is hoping to undergo surgery by Dr. Kathyrn Sheriff.  He endorses compliance with his medical therapy.  Unfortunately, he continues to smoke half pack a day.    Current Outpatient Medications:    acetaminophen (TYLENOL) 500 MG tablet, Take 500 mg by mouth every 6 (six) hours as needed., Disp: , Rfl:    allopurinol (ZYLOPRIM) 100 MG tablet, Take 100 mg by mouth daily., Disp: , Rfl:    amLODipine (NORVASC) 10 MG tablet, Take 1 tablet (10 mg total) by mouth daily with lunch., Disp: , Rfl:    apixaban (ELIQUIS) 5 MG TABS tablet, Take 1 tablet (5 mg total) by mouth 2 (two) times daily., Disp: 60 tablet, Rfl: 2   atorvastatin (LIPITOR) 40 MG tablet, Take 1 tablet (40 mg total) by mouth daily., Disp: 30 tablet, Rfl: 2   esomeprazole (NEXIUM) 20 MG packet, Take 20 mg by mouth daily before breakfast., Disp: , Rfl:    fluticasone (FLONASE) 50 MCG/ACT nasal spray, SMARTSIG:2 Spray(s) Both Nares PRN, Disp: , Rfl:    metoprolol succinate (TOPROL-XL) 50 MG 24 hr tablet, Take 50 mg by mouth daily. , Disp: , Rfl:    nicotine (NICODERM CQ - DOSED IN MG/24 HOURS) 14 mg/24hr patch, Place 1 patch (14 mg total) onto the skin daily., Disp: 28 patch, Rfl: 0   omega-3 acid ethyl esters (LOVAZA) 1 G capsule, Take 2 g by mouth daily. ,  Disp: , Rfl:    ONE TOUCH ULTRA TEST test strip, USE 1 STRIP TO CHECK GLUCOSE TWICE DAILY, Disp: , Rfl:    OZEMPIC, 0.25 OR 0.5 MG/DOSE, 2 MG/1.5ML SOPN, Inject 0.25 mg into the skin once a week. Tuesdays, Disp: , Rfl:    Polyvinyl Alcohol-Povidone (REFRESH OP), Place 1 drop into both eyes 2 (two) times daily as needed (dry eye)., Disp: , Rfl:    Cardiovascular & other pertient studies:  Reviewed external labs and tests, independently interpreted  EKG 04/03/2022: Sinus rhythm 81 bpm Leftward axis Borderline first-degree AV block  Carotid artery duplex 04/20/2022:  Duplex suggests stenosis in the right internal carotid artery (minimal),  mild heterogenous plaque is noted in the proximal ICA.  Duplex suggests stenosis in the left internal carotid artery (1-15%). Mild  homogenous plaque is noted in the left ICA  Antegrade right vertebral artery flow. Antegrade left vertebral artery  flow.  Follow-up studies if clinically indicated.  Brain MRI 03/16/2022: 1. Acute infarcts in the Right MCA (6 cm, frontal operculum and superior frontal gyrus), Right PCA (right occipital pole), and Right Cerebellar artery territories. Associated MCA and PCA Heidelberg Classification 1B petechial hemorrhage. No significant mass effect.   2. Underlying small chronic lacunar infarcts of the left globus pallidus and cerebellum (left PICA).      Exercise  nuclear stress test 11/22/2021: Normal ECG stress. The patient exercised for 7 minutes and 20 seconds of a Bruce protocol, achieving approximately 8.05 METs.  The blood pressure response was normal. Myocardial perfusion is normal. Overall LV systolic function is normal without regional wall motion abnormalities. Stress LV EF: 62%.  No previous exam available for comparison. Low risk.   Echocardiogram 11/22/2021:  Left ventricle cavity is normal in size. Moderate concentric hypertrophy  of the left ventricle. Normal global wall motion. Normal LV systolic   function with visual EF 61%. Doppler evidence of grade I (impaired)  diastolic dysfunction, normal LAP.  Structurally normal trileaflet aoratic valve. No evidence of aortic  stenosis. Mild (Grade I) aortic regurgitation.  Mild tricuspid regurgitation. Estimated pulmonary artery systolic pressure  30 mmHg.     Recent labs: 03/17/2022: Glucose 99, BUN/Cr 7/0.88. EGFR >60. Na/K 140/3.5.  H/H 14/41. MCV 89. Platelets 194 HbA1C 6.6% Chol 181, TG 189, HDL 28, LDL 115 TSH 1.2 normal    Review of Systems  Cardiovascular:  Negative for chest pain, dyspnea on exertion, leg swelling, palpitations and syncope.  Musculoskeletal:  Positive for back pain.         Vitals:   07/05/22 1054 07/05/22 1059  BP: (!) 128/91 121/87  Pulse: 72 70  Resp: 16   Temp: 98 F (36.7 C)   SpO2: 95%     Body mass index is 30.87 kg/m. Filed Weights   07/05/22 1054  Weight: 247 lb (112 kg)     Objective:   Physical Exam Vitals and nursing note reviewed.  Constitutional:      General: He is not in acute distress. Neck:     Vascular: No JVD.  Cardiovascular:     Rate and Rhythm: Normal rate and regular rhythm.     Heart sounds: Normal heart sounds. No murmur heard. Pulmonary:     Effort: Pulmonary effort is normal.     Breath sounds: Normal breath sounds. No wheezing or rales.  Musculoskeletal:     Right lower leg: No edema.     Left lower leg: No edema.             Visit diagnoses: No diagnosis found.   No orders of the defined types were placed in this encounter.    Medication changes this visit: Medications Discontinued During This Encounter  Medication Reason   folic acid (FOLVITE) 1 MG tablet    Multiple Vitamin (MULTIVITAMIN WITH MINERALS) TABS tablet    Psyllium (METAMUCIL PO)    vitamin B-12 (CYANOCOBALAMIN) 250 MCG tablet    psyllium (REGULOID) 0.52 g capsule     No orders of the defined types were placed in this encounter.    Assessment & Recommendations:    66 year old African-American male with hypertension, hyperlipidemia, type 2 diabetes mellitus, h/o stroke with minimal residual neurodeficit, PAF, smoker, h/o alcohol abuse, cannabis use, gout, colon cancer in remission  PAF: H/o stroke. CHA2DS2VASc score 3, annual stroke risj 3.2%. Continue Eliquis 5 mg twice daily  Preoperative stratification: No angina symptoms at baseline.  Compliant with medical therapy.  Reasonable to proceed with upcoming spine surgery, provided Eliquis interruption can be kept a minimum-2 days prior to and 1 day after the surgery.  Mixed hyperlipidemia: Reportedly compliant.  Continue statin.  Check lipid panel.  Nicotine dependence: Tobacco cessation counseling:  - Currently smoking 1/2 packs/day   - Patient was informed of the dangers of tobacco abuse including stroke, cancer, and MI, as well as benefits of  tobacco cessation. - Patient is willing to quit at this time. - Approximately 5 mins were spent counseling patient cessation techniques. We discussed various methods to help quit smoking, including deciding on a date to quit, joining a support group, pharmacological agents. Patient would like to use nicotine patch. - I will reassess his progress at the next follow-up visit   F/u in 3 months   Nigel Mormon, MD Pager: [phone] Office: [phone]

## 2022-07-06 DIAGNOSIS — E782 Mixed hyperlipidemia: Secondary | ICD-10-CM | POA: Diagnosis not present

## 2022-07-07 LAB — LIPID PANEL
Chol/HDL Ratio: 2.9 ratio (ref 0.0–5.0)
Cholesterol, Total: 112 mg/dL (ref 100–199)
HDL: 39 mg/dL — ABNORMAL LOW (ref >39)
LDL Chol Calc (NIH): 53 mg/dL (ref 0–99)
Triglycerides: 108 mg/dL (ref 0–149)
VLDL Cholesterol Cal: 20 mg/dL (ref 5–40)

## 2022-07-09 NOTE — Progress Notes (Signed)
Called patient, NA, left results on VM.

## 2022-07-12 ENCOUNTER — Other Ambulatory Visit: Payer: Self-pay | Admitting: Neurosurgery

## 2022-07-16 DIAGNOSIS — M48062 Spinal stenosis, lumbar region with neurogenic claudication: Secondary | ICD-10-CM | POA: Diagnosis not present

## 2022-07-16 DIAGNOSIS — I63311 Cerebral infarction due to thrombosis of right middle cerebral artery: Secondary | ICD-10-CM | POA: Diagnosis not present

## 2022-07-16 DIAGNOSIS — I480 Paroxysmal atrial fibrillation: Secondary | ICD-10-CM | POA: Diagnosis not present

## 2022-07-16 DIAGNOSIS — I10 Essential (primary) hypertension: Secondary | ICD-10-CM | POA: Diagnosis not present

## 2022-07-16 DIAGNOSIS — Z720 Tobacco use: Secondary | ICD-10-CM | POA: Diagnosis not present

## 2022-07-16 DIAGNOSIS — Z01818 Encounter for other preprocedural examination: Secondary | ICD-10-CM | POA: Diagnosis not present

## 2022-07-16 DIAGNOSIS — E785 Hyperlipidemia, unspecified: Secondary | ICD-10-CM | POA: Diagnosis not present

## 2022-07-16 DIAGNOSIS — E1169 Type 2 diabetes mellitus with other specified complication: Secondary | ICD-10-CM | POA: Diagnosis not present

## 2022-07-26 ENCOUNTER — Other Ambulatory Visit: Payer: Self-pay | Admitting: Neurosurgery

## 2022-07-30 NOTE — Pre-Procedure Instructions (Signed)
Surgical Instructions    Your procedure is scheduled on August 03, 2022.  Report to [facility] Main Entrance "A" at 9:30 A.M., then check in with the Admitting office.  Call this number if you have problems the morning of surgery:  [phone]   If you have any questions prior to your surgery date call [phone]: Open Monday-Friday 8am-4pm    Remember:  Do not eat or drink after midnight the night before your surgery     Take these medicines the morning of surgery with A SIP OF WATER:  allopurinol (ZYLOPRIM)  amLODipine (NORVASC)   atorvastatin (LIPITOR)  metoprolol succinate (TOPROL-XL)   Polyvinyl Alcohol-Povidone (REFRESH OP)    Take these medicines the morning of surgery AS NEEDED:  acetaminophen (TYLENOL)  esomeprazole (NEXIUM)  fluticasone (FLONASE)     STOP taking apixaban (ELIQUIS) three days prior to your surgery. Your last dose of this medication will be August 28th.    As of today, STOP taking any Aspirin (unless otherwise instructed by your surgeon) Aleve, Naproxen, Ibuprofen, Motrin, Advil, Goody's, BC's, all herbal medications, fish oil, and all vitamins. This includes your medication: diclofenac Sodium (VOLTAREN) 1 % GEL  WHAT DO I DO ABOUT MY DIABETES MEDICATION?  Do not take [place] the morning of surgery.   HOW TO MANAGE YOUR DIABETES BEFORE AND AFTER SURGERY  Why is it important to control my blood sugar before and after surgery? Improving blood sugar levels before and after surgery helps healing and can limit problems. A way of improving blood sugar control is eating a healthy diet by:  Eating less sugar and carbohydrates  Increasing activity/exercise  Talking with your doctor about reaching your blood sugar goals High blood sugars (greater than 180 mg/dL) can raise your risk of infections and slow your recovery, so you will need to focus on controlling your diabetes during the weeks before surgery. Make sure that the doctor who takes care  of your diabetes knows about your planned surgery including the date and location.  How do I manage my blood sugar before surgery? Check your blood sugar at least 4 times a day, starting 2 days before surgery, to make sure that the level is not too high or low.  Check your blood sugar the morning of your surgery when you wake up and every 2 hours until you get to the Short Stay unit.  If your blood sugar is less than 70 mg/dL, you will need to treat for low blood sugar: Do not take insulin. Treat a low blood sugar (less than 70 mg/dL) with  cup of clear juice (cranberry or apple), 4 glucose tablets, OR glucose gel. Recheck blood sugar in 15 minutes after treatment (to make sure it is greater than 70 mg/dL). If your blood sugar is not greater than 70 mg/dL on recheck, call [phone] for further instructions. Report your blood sugar to the short stay nurse when you get to Short Stay.  If you are admitted to the hospital after surgery: Your blood sugar will be checked by the staff and you will probably be given insulin after surgery (instead of oral diabetes medicines) to make sure you have good blood sugar levels. The goal for blood sugar control after surgery is 80-180 mg/dL.                      Do NOT Smoke (Tobacco/Vaping) for 24 hours prior to your procedure.  If you use a CPAP at night, you may bring  your mask/headgear for your overnight stay.   Contacts, glasses, piercing's, hearing aid's, dentures or partials may not be worn into surgery, please bring cases for these belongings.    For patients admitted to the hospital, discharge time will be determined by your treatment team.   Patients discharged the day of surgery will not be allowed to drive home, and someone needs to stay with them for 24 hours.  SURGICAL WAITING ROOM VISITATION Patients having surgery or a procedure may have no more than 2 support people in the waiting area - these visitors may rotate.   Children under  the age of 8 must have an adult with them who is not the patient. If the patient needs to stay at the hospital during part of their recovery, the visitor guidelines for inpatient rooms apply. Pre-op nurse will coordinate an appropriate time for 1 support person to accompany patient in pre-op.  This support person may not rotate.   Please refer to the [facility] website for the visitor guidelines for Inpatients (after your surgery is over and you are in a regular room).    Special instructions:   [place]- Preparing For Surgery  Before surgery, you can play an important role. Because skin is not sterile, your skin needs to be as free of germs as possible. You can reduce the number of germs on your skin by washing with CHG (chlorahexidine gluconate) Soap before surgery.  CHG is an antiseptic cleaner which kills germs and bonds with the skin to continue killing germs even after washing.    Oral Hygiene is also important to reduce your risk of infection.  Remember - BRUSH YOUR TEETH THE MORNING OF SURGERY WITH YOUR REGULAR TOOTHPASTE  Please do not use if you have an allergy to CHG or antibacterial soaps. If your skin becomes reddened/irritated stop using the CHG.  Do not shave (including legs and underarms) for at least 48 hours prior to first CHG shower. It is OK to shave your face.  Please follow these instructions carefully.   Shower the NIGHT BEFORE SURGERY and the MORNING OF SURGERY  If you chose to wash your hair, wash your hair first as usual with your normal shampoo.  After you shampoo, rinse your hair and body thoroughly to remove the shampoo.  Use CHG Soap as you would any other liquid soap. You can apply CHG directly to the skin and wash gently with a scrungie or a clean washcloth.   Apply the CHG Soap to your body ONLY FROM THE NECK DOWN.  Do not use on open wounds or open sores. Avoid contact with your eyes, ears, mouth and genitals (private parts). Wash Face and genitals  (private parts)  with your normal soap.   Wash thoroughly, paying special attention to the area where your surgery will be performed.  Thoroughly rinse your body with warm water from the neck down.  DO NOT shower/wash with your normal soap after using and rinsing off the CHG Soap.  Pat yourself dry with a CLEAN TOWEL.  Wear CLEAN PAJAMAS to bed the night before surgery  Place CLEAN SHEETS on your bed the night before your surgery  DO NOT SLEEP WITH PETS.   Day of Surgery: Take a shower with CHG soap. Do not wear jewelry or makeup Do not wear lotions, powders, colognes, or deodorant. Do not shave 48 hours prior to surgery.  Men may shave face and neck. Do not bring valuables to the hospital.  [facility] is  not responsible for any belongings or valuables. Do not wear nail polish, gel polish, artificial nails, or any other type of covering on natural nails (fingers and toes) If you have artificial nails or gel coating that need to be removed by a nail salon, please have this removed prior to surgery. Artificial nails or gel coating may interfere with anesthesia's ability to adequately monitor your vital signs.  Wear Clean/Comfortable clothing the morning of surgery Remember to brush your teeth WITH YOUR REGULAR TOOTHPASTE.   Please read over the following fact sheets that you were given.    If you received a COVID test during your pre-op visit  it is requested that you wear a mask when out in public, stay away from anyone that may not be feeling well and notify your surgeon if you develop symptoms. If you have been in contact with anyone that has tested positive in the last 10 days please notify you surgeon.

## 2022-07-31 ENCOUNTER — Encounter (HOSPITAL_COMMUNITY): Payer: Self-pay

## 2022-07-31 ENCOUNTER — Encounter (HOSPITAL_COMMUNITY)
Admission: RE | Admit: 2022-07-31 | Discharge: 2022-07-31 | Disposition: A | Discharge status: Home / Self Care | Payer: Medicare Other | Source: Ambulatory Visit | Attending: Neurosurgery | Admitting: Neurosurgery

## 2022-07-31 ENCOUNTER — Other Ambulatory Visit: Payer: Self-pay

## 2022-07-31 VITALS — BP 127/89 | HR 76 | Temp 98.0°F | Resp 18 | Ht 75.0 in | Wt 247.8 lb

## 2022-07-31 DIAGNOSIS — I480 Paroxysmal atrial fibrillation: Secondary | ICD-10-CM | POA: Insufficient documentation

## 2022-07-31 DIAGNOSIS — E119 Type 2 diabetes mellitus without complications: Secondary | ICD-10-CM | POA: Diagnosis not present

## 2022-07-31 DIAGNOSIS — F17210 Nicotine dependence, cigarettes, uncomplicated: Secondary | ICD-10-CM | POA: Insufficient documentation

## 2022-07-31 DIAGNOSIS — Z8673 Personal history of transient ischemic attack (TIA), and cerebral infarction without residual deficits: Secondary | ICD-10-CM | POA: Insufficient documentation

## 2022-07-31 DIAGNOSIS — Z7901 Long term (current) use of anticoagulants: Secondary | ICD-10-CM | POA: Diagnosis not present

## 2022-07-31 DIAGNOSIS — R471 Dysarthria and anarthria: Secondary | ICD-10-CM | POA: Insufficient documentation

## 2022-07-31 DIAGNOSIS — I10 Essential (primary) hypertension: Secondary | ICD-10-CM | POA: Insufficient documentation

## 2022-07-31 DIAGNOSIS — Z91148 Patient's other noncompliance with medication regimen for other reason: Secondary | ICD-10-CM | POA: Insufficient documentation

## 2022-07-31 DIAGNOSIS — R29810 Facial weakness: Secondary | ICD-10-CM | POA: Diagnosis not present

## 2022-07-31 DIAGNOSIS — Z01818 Encounter for other preprocedural examination: Secondary | ICD-10-CM

## 2022-07-31 DIAGNOSIS — I082 Rheumatic disorders of both aortic and tricuspid valves: Secondary | ICD-10-CM | POA: Diagnosis not present

## 2022-07-31 DIAGNOSIS — Z01812 Encounter for preprocedural laboratory examination: Secondary | ICD-10-CM | POA: Diagnosis not present

## 2022-07-31 DIAGNOSIS — E785 Hyperlipidemia, unspecified: Secondary | ICD-10-CM | POA: Diagnosis not present

## 2022-07-31 HISTORY — DX: Cardiac arrhythmia, unspecified: I49.9

## 2022-07-31 LAB — CBC
HCT: 46.7 % (ref 39.0–52.0)
Hemoglobin: 15.8 g/dL (ref 13.0–17.0)
MCH: 30.8 pg (ref 26.0–34.0)
MCHC: 33.8 g/dL (ref 30.0–36.0)
MCV: 91 fL (ref 80.0–100.0)
Platelets: 203 10*3/uL (ref 150–400)
RBC: 5.13 MIL/uL (ref 4.22–5.81)
RDW: 12.6 % (ref 11.5–15.5)
WBC: 7.2 10*3/uL (ref 4.0–10.5)
nRBC: 0 % (ref 0.0–0.2)

## 2022-07-31 LAB — HEMOGLOBIN A1C
Hgb A1c MFr Bld: 6.2 % — ABNORMAL HIGH (ref 4.8–5.6)
Mean Plasma Glucose: 131.24 mg/dL

## 2022-07-31 LAB — TYPE AND SCREEN
ABO/RH(D): O POS
Antibody Screen: NEGATIVE

## 2022-07-31 LAB — BASIC METABOLIC PANEL
Anion gap: 7 (ref 5–15)
BUN: 5 mg/dL — ABNORMAL LOW (ref 8–23)
CO2: 26 mmol/L (ref 22–32)
Calcium: 9.3 mg/dL (ref 8.9–10.3)
Chloride: 106 mmol/L (ref 98–111)
Creatinine, Ser: 0.88 mg/dL (ref 0.61–1.24)
GFR, Estimated: >60 mL/min (ref >60)
Glucose, Bld: 113 mg/dL — ABNORMAL HIGH (ref 70–99)
Potassium: 3.5 mmol/L (ref 3.5–5.1)
Sodium: 139 mmol/L (ref 135–145)

## 2022-07-31 LAB — SURGICAL PCR SCREEN
MRSA, PCR: NEGATIVE
Staphylococcus aureus: NEGATIVE

## 2022-07-31 LAB — GLUCOSE, CAPILLARY: Glucose-Capillary: 122 mg/dL — ABNORMAL HIGH (ref 70–99)

## 2022-07-31 NOTE — Progress Notes (Signed)
PCP - [name] Cardiologist - Manish Patwardhan  PPM/ICD - n/a Device Orders - n/a Rep Notified - n/a  Chest x-ray - n/a EKG - 04/03/22 Stress Test - 1221/22 ECHO - 03/16/22 Cardiac Cath - n/a  Sleep Study - Denies. Stop Bang Score >5. Result sent to PCP.  CPAP - n/a  CBG today- 122 Checks Blood Sugar daily Range- 70-150 per patient  Blood Thinner Instructions: Patient states he took last dose of Eliquis on Saturday 07/28/22 per instructions from MD.  Aspirin Instructions: n/a  ERAS Protcol - NPO  COVID TEST- n/a   Anesthesia review: Yes. Clearance note in chart  Patient denies shortness of breath, fever, cough and chest pain at PAT appointment   All instructions explained to the patient, with a verbal understanding of the material. Patient agrees to go over the instructions while at home for a better understanding. The opportunity to ask questions was provided.

## 2022-08-01 NOTE — Anesthesia Preprocedure Evaluation (Addendum)
Anesthesia Evaluation  Patient identified by MRN, date of birth, ID band Patient awake    Reviewed: Allergy & Precautions, H&P , NPO status , Patient's Chart, lab work & pertinent test results  Airway Mallampati: II  TM Distance: >3 FB Neck ROM: Full    Dental no notable dental hx. (+) Dental Advisory Given, Poor Dentition   Pulmonary Current Smoker and Patient abstained from smoking.,    Pulmonary exam normal breath sounds clear to auscultation       Cardiovascular hypertension, Pt. on medications and Pt. on home beta blockers + dysrhythmias Atrial Fibrillation  Rhythm:Regular Rate:Normal     Neuro/Psych CVA, Residual Symptoms negative psych ROS   GI/Hepatic negative GI ROS, Neg liver ROS,   Endo/Other  diabetes, Type 2, Insulin Dependent  Renal/GU negative Renal ROS  negative genitourinary   Musculoskeletal  (+) Arthritis , Osteoarthritis,    Abdominal   Peds  Hematology negative hematology ROS (+)   Anesthesia Other Findings   Reproductive/Obstetrics                           Anesthesia Physical Anesthesia Plan  ASA: 3  Anesthesia Plan: General   Post-op Pain Management: Tylenol PO (pre-op)*   Induction: Intravenous  PONV Risk Score and Plan: 2 and Ondansetron, Dexamethasone and Midazolam  Airway Management Planned: Oral ETT  Additional Equipment:   Intra-op Plan:   Post-operative Plan: Extubation in OR  Informed Consent: I have reviewed the patients History and Physical, chart, labs and discussed the procedure including the risks, benefits and alternatives for the proposed anesthesia with the patient or authorized representative who has indicated his/her understanding and acceptance.     Dental advisory given  Plan Discussed with: CRNA  Anesthesia Plan Comments: (PAT note by Karoline Caldwell, PA-C:  Cardiology for history of HTN, HLD, CVA, paroxysmal AF on Eliquis.   Nuclear stress 11/2021 was low risk.  Echo 11/2021 showed EF 61%, grade 1 DD, mild aortic regurgitation, mild tricuspid regurgitation.  Last seen by Dr. Virgina Jock 07/05/2022 for preop evaluation.  Per note, "No angina symptoms at baseline. Compliant with medical therapy. Reasonable to proceed with upcoming spine surgery, provided Eliquis interruption can be kept a minimum-2 days prior to and 1 day after the surgery."  History of CVA April 2023.  Stroke was felt secondary to noncompliance with Eliquis for paroxysmal A-fib.  Patient also noted to have history of heavy EtOH use at that time, however, he reports abstinence since discharge.  Last seen by neurology 04/18/2022 and noted to still have a left facial droop and some dysarthria.  He was recommended to continue Eliquis, continue to follow with cardiology, and follow-up with neurology on an as-needed basis  Patient reports last dose Eliquis 07/28/2022.  Current smoker, half pack per day.  Non-insulin-dependent DM2, well controlled, A1c 6.2 on 07/31/2022.  Concern for OSA, STOP-BANG score 5.  Preop labs reviewed, unremarkable.  EKG5/01/2022: Sinus rhythm 81 bpm. Leftward axis. Borderline first-degree AV block  Carotid artery duplex 04/20/2022:  Duplex suggests stenosis in the right internal carotid artery (minimal),  mild heterogenous plaque is noted in the proximal ICA.  Duplex suggests stenosis in the left internal carotid artery (1-15%). Mild  homogenous plaque is noted in the left ICA  Antegrade right vertebral artery flow. Antegrade left vertebral artery  flow.  Follow-up studies if clinically indicated.  Exercise nuclear stress test12/21/2022: Normal ECG stress. The patient exercised for 7 minutes and 20 seconds  of a Bruce protocol, achieving approximately 8.05 METs. The blood pressure response was normal. Myocardial perfusion is normal. Overall LV systolic function is normal without regional wall motion abnormalities. Stress LV  EF: 62%.  No previous exam available for comparison. Low risk.   Echocardiogram 11/22/2021:  Left ventricle cavity is normal in size. Moderate concentric hypertrophy  of the left ventricle. Normal global wall motion. Normal LV systolic  function with visual EF 61%. Doppler evidence of grade I (impaired)  diastolic dysfunction, normal LAP.  Structurally normal trileaflet aoratic valve. No evidence of aortic  stenosis. Mild (Grade I) aortic regurgitation.  Mild tricuspid regurgitation. Estimated pulmonary artery systolic pressure  30 mmHg.    )       Anesthesia Quick Evaluation

## 2022-08-01 NOTE — Progress Notes (Signed)
Anesthesia Chart Review:  Cardiology for history of HTN, HLD, CVA, paroxysmal AF on Eliquis.  Nuclear stress 11/2021 was low risk.  Echo 11/2021 showed EF 61%, grade 1 DD, mild aortic regurgitation, mild tricuspid regurgitation.  Last seen by Dr. Virgina Jock 07/05/2022 for preop evaluation.  Per note, "No angina symptoms at baseline.  Compliant with medical therapy.  Reasonable to proceed with upcoming spine surgery, provided Eliquis interruption can be kept a minimum-2 days prior to and 1 day after the surgery."  History of CVA April 2023.  Stroke was felt secondary to noncompliance with Eliquis for paroxysmal A-fib.  Patient also noted to have history of heavy EtOH use at that time, however, he reports abstinence since discharge.  Last seen by neurology 04/18/2022 and noted to still have a left facial droop and some dysarthria.  He was recommended to continue Eliquis, continue to follow with cardiology, and follow-up with neurology on an as-needed basis  Patient reports last dose Eliquis 07/28/2022.  Current smoker, half pack per day.  Non-insulin-dependent DM2, well controlled, A1c 6.2 on 07/31/2022.  Concern for OSA, STOP-BANG score 5.  Preop labs reviewed, unremarkable.  EKG 04/03/2022: Sinus rhythm 81 bpm. Leftward axis. Borderline first-degree AV block   Carotid artery duplex 04/20/2022:  Duplex suggests stenosis in the right internal carotid artery (minimal),  mild heterogenous plaque is noted in the proximal ICA.  Duplex suggests stenosis in the left internal carotid artery (1-15%). Mild  homogenous plaque is noted in the left ICA  Antegrade right vertebral artery flow. Antegrade left vertebral artery  flow.  Follow-up studies if clinically indicated.    Exercise nuclear stress test 11/22/2021: Normal ECG stress. The patient exercised for 7 minutes and 20 seconds of a Bruce protocol, achieving approximately 8.05 METs.  The blood pressure response was normal. Myocardial perfusion is  normal. Overall LV systolic function is normal without regional wall motion abnormalities. Stress LV EF: 62%.  No previous exam available for comparison. Low risk.    Echocardiogram 11/22/2021:  Left ventricle cavity is normal in size. Moderate concentric hypertrophy  of the left ventricle. Normal global wall motion. Normal LV systolic  function with visual EF 61%. Doppler evidence of grade I (impaired)  diastolic dysfunction, normal LAP.  Structurally normal trileaflet aoratic valve. No evidence of aortic  stenosis. Mild (Grade I) aortic regurgitation.  Mild tricuspid regurgitation. Estimated pulmonary artery systolic pressure  30 mmHg.     Wynonia Musty [facility] Short Stay Center/Anesthesiology Phone [phone] 08/01/2022 1:53 PM

## 2022-08-02 DIAGNOSIS — E118 Type 2 diabetes mellitus with unspecified complications: Secondary | ICD-10-CM | POA: Diagnosis not present

## 2022-08-02 DIAGNOSIS — I10 Essential (primary) hypertension: Secondary | ICD-10-CM | POA: Diagnosis not present

## 2022-08-02 DIAGNOSIS — E7849 Other hyperlipidemia: Secondary | ICD-10-CM | POA: Diagnosis not present

## 2022-08-03 ENCOUNTER — Encounter (HOSPITAL_COMMUNITY): Payer: Self-pay | Admitting: Neurosurgery

## 2022-08-03 ENCOUNTER — Ambulatory Visit (HOSPITAL_COMMUNITY): Payer: Medicare Other

## 2022-08-03 ENCOUNTER — Ambulatory Visit (HOSPITAL_BASED_OUTPATIENT_CLINIC_OR_DEPARTMENT_OTHER): Payer: Medicare Other | Admitting: Certified Registered"

## 2022-08-03 ENCOUNTER — Other Ambulatory Visit: Payer: Self-pay

## 2022-08-03 ENCOUNTER — Encounter (HOSPITAL_COMMUNITY)
Admission: RE | Disposition: A | Discharge status: Home / Self Care | Payer: Self-pay | Source: Ambulatory Visit | Attending: Neurosurgery

## 2022-08-03 ENCOUNTER — Observation Stay (HOSPITAL_COMMUNITY)
Admission: RE | Admit: 2022-08-03 | Discharge: 2022-08-04 | Disposition: A | Discharge status: Home / Self Care | Payer: Medicare Other | Source: Ambulatory Visit | Attending: Neurosurgery | Admitting: Neurosurgery

## 2022-08-03 ENCOUNTER — Ambulatory Visit (HOSPITAL_COMMUNITY): Payer: Medicare Other | Admitting: Physician Assistant

## 2022-08-03 DIAGNOSIS — M48062 Spinal stenosis, lumbar region with neurogenic claudication: Secondary | ICD-10-CM

## 2022-08-03 DIAGNOSIS — Z8673 Personal history of transient ischemic attack (TIA), and cerebral infarction without residual deficits: Secondary | ICD-10-CM | POA: Diagnosis not present

## 2022-08-03 DIAGNOSIS — M47816 Spondylosis without myelopathy or radiculopathy, lumbar region: Secondary | ICD-10-CM

## 2022-08-03 DIAGNOSIS — M4326 Fusion of spine, lumbar region: Secondary | ICD-10-CM | POA: Diagnosis not present

## 2022-08-03 DIAGNOSIS — Z794 Long term (current) use of insulin: Secondary | ICD-10-CM | POA: Diagnosis not present

## 2022-08-03 DIAGNOSIS — Z7901 Long term (current) use of anticoagulants: Secondary | ICD-10-CM | POA: Diagnosis not present

## 2022-08-03 DIAGNOSIS — M47896 Other spondylosis, lumbar region: Principal | ICD-10-CM | POA: Insufficient documentation

## 2022-08-03 DIAGNOSIS — G8929 Other chronic pain: Secondary | ICD-10-CM | POA: Insufficient documentation

## 2022-08-03 DIAGNOSIS — F17210 Nicotine dependence, cigarettes, uncomplicated: Secondary | ICD-10-CM | POA: Diagnosis not present

## 2022-08-03 DIAGNOSIS — I10 Essential (primary) hypertension: Secondary | ICD-10-CM | POA: Insufficient documentation

## 2022-08-03 DIAGNOSIS — Z85038 Personal history of other malignant neoplasm of large intestine: Secondary | ICD-10-CM | POA: Diagnosis not present

## 2022-08-03 DIAGNOSIS — E119 Type 2 diabetes mellitus without complications: Secondary | ICD-10-CM | POA: Insufficient documentation

## 2022-08-03 DIAGNOSIS — Z79899 Other long term (current) drug therapy: Secondary | ICD-10-CM | POA: Diagnosis not present

## 2022-08-03 DIAGNOSIS — M48061 Spinal stenosis, lumbar region without neurogenic claudication: Secondary | ICD-10-CM | POA: Diagnosis present

## 2022-08-03 DIAGNOSIS — I4891 Unspecified atrial fibrillation: Secondary | ICD-10-CM | POA: Insufficient documentation

## 2022-08-03 DIAGNOSIS — M4726 Other spondylosis with radiculopathy, lumbar region: Secondary | ICD-10-CM | POA: Diagnosis not present

## 2022-08-03 DIAGNOSIS — Z981 Arthrodesis status: Secondary | ICD-10-CM | POA: Diagnosis not present

## 2022-08-03 HISTORY — PX: BACK SURGERY: SHX140

## 2022-08-03 LAB — GLUCOSE, CAPILLARY
Glucose-Capillary: 137 mg/dL — ABNORMAL HIGH (ref 70–99)
Glucose-Capillary: 100 mg/dL — ABNORMAL HIGH (ref 70–99)
Glucose-Capillary: 103 mg/dL — ABNORMAL HIGH (ref 70–99)
Glucose-Capillary: 136 mg/dL — ABNORMAL HIGH (ref 70–99)
Glucose-Capillary: 139 mg/dL — ABNORMAL HIGH (ref 70–99)
Glucose-Capillary: 149 mg/dL — ABNORMAL HIGH (ref 70–99)

## 2022-08-03 SURGERY — POSTERIOR LUMBAR FUSION 1 LEVEL
Anesthesia: General | Site: Back

## 2022-08-03 MED ORDER — CHLORHEXIDINE GLUCONATE CLOTH 2 % EX PADS: 6.0000 | MEDICATED_PAD | Freq: Once | CUTANEOUS | Status: DC

## 2022-08-03 MED ORDER — ROCURONIUM BROMIDE 10 MG/ML (PF) SYRINGE
PREFILLED_SYRINGE | INTRAVENOUS | Status: AC
  Filled 2022-08-03: qty 10

## 2022-08-03 MED ORDER — LIDOCAINE-EPINEPHRINE 1 %-1:100000 IJ SOLN
INTRAMUSCULAR | Status: DC | PRN
  Administered 2022-08-03: 5 mL

## 2022-08-03 MED ORDER — DOCUSATE SODIUM 100 MG PO CAPS
100.0000 mg | ORAL_CAPSULE | Freq: Two times a day (BID) | ORAL | Status: DC
  Administered 2022-08-03 – 2022-08-04 (×2): 100 mg via ORAL
  Filled 2022-08-03 (×2): qty 1

## 2022-08-03 MED ORDER — OXYCODONE HCL 5 MG PO TABS: 5.0000 mg | ORAL_TABLET | ORAL | Status: DC | PRN

## 2022-08-03 MED ORDER — SODIUM CHLORIDE 0.9% FLUSH
3.0000 mL | INTRAVENOUS | Status: DC | PRN
  Administered 2022-08-04: 3 mL via INTRAVENOUS

## 2022-08-03 MED ORDER — ATORVASTATIN CALCIUM 40 MG PO TABS
40.0000 mg | ORAL_TABLET | Freq: Every day | ORAL | Status: DC
  Administered 2022-08-04: 40 mg via ORAL
  Filled 2022-08-03: qty 1

## 2022-08-03 MED ORDER — THROMBIN 5000 UNITS EX SOLR
OROMUCOSAL | Status: DC | PRN
  Administered 2022-08-03: 5 mL via TOPICAL

## 2022-08-03 MED ORDER — CHLORHEXIDINE GLUCONATE 0.12 % MT SOLN
15.0000 mL | Freq: Once | OROMUCOSAL | Status: AC
  Administered 2022-08-03: 15 mL via OROMUCOSAL
  Filled 2022-08-03: qty 15

## 2022-08-03 MED ORDER — CEFAZOLIN SODIUM-DEXTROSE 2-4 GM/100ML-% IV SOLN
2.0000 g | Freq: Three times a day (TID) | INTRAVENOUS | Status: AC
  Administered 2022-08-03 – 2022-08-04 (×2): 2 g via INTRAVENOUS
  Filled 2022-08-03 (×2): qty 100

## 2022-08-03 MED ORDER — PHENYLEPHRINE 80 MCG/ML (10ML) SYRINGE FOR IV PUSH (FOR BLOOD PRESSURE SUPPORT)
PREFILLED_SYRINGE | INTRAVENOUS | Status: DC | PRN
  Administered 2022-08-03: 80 ug via INTRAVENOUS
  Administered 2022-08-03: 160 ug via INTRAVENOUS
  Administered 2022-08-03 (×2): 80 ug via INTRAVENOUS
  Administered 2022-08-03: 160 ug via INTRAVENOUS
  Administered 2022-08-03: 80 ug via INTRAVENOUS
  Administered 2022-08-03: 160 ug via INTRAVENOUS

## 2022-08-03 MED ORDER — FLUTICASONE PROPIONATE 50 MCG/ACT NA SUSP: 2.0000 | Freq: Every day | NASAL | Status: DC | PRN

## 2022-08-03 MED ORDER — SODIUM CHLORIDE 0.9% FLUSH
3.0000 mL | Freq: Two times a day (BID) | INTRAVENOUS | Status: DC
  Administered 2022-08-04: 3 mL via INTRAVENOUS

## 2022-08-03 MED ORDER — HYDROMORPHONE HCL 1 MG/ML IJ SOLN
0.2500 mg | INTRAMUSCULAR | Status: DC | PRN
  Administered 2022-08-03 (×2): 0.25 mg via INTRAVENOUS
  Administered 2022-08-03: 0.5 mg via INTRAVENOUS

## 2022-08-03 MED ORDER — OXYCODONE HCL 5 MG PO TABS
10.0000 mg | ORAL_TABLET | ORAL | Status: DC | PRN
  Administered 2022-08-03 (×2): 10 mg via ORAL
  Filled 2022-08-03 (×2): qty 2

## 2022-08-03 MED ORDER — MIDAZOLAM HCL 2 MG/2ML IJ SOLN
INTRAMUSCULAR | Status: AC
Start: 2022-08-03 — End: ?
  Filled 2022-08-03: qty 2

## 2022-08-03 MED ORDER — ROCURONIUM BROMIDE 10 MG/ML (PF) SYRINGE
PREFILLED_SYRINGE | INTRAVENOUS | Status: DC | PRN
  Administered 2022-08-03 (×3): 50 mg via INTRAVENOUS

## 2022-08-03 MED ORDER — ONDANSETRON HCL 4 MG/2ML IJ SOLN: 4.0000 mg | Freq: Four times a day (QID) | INTRAMUSCULAR | Status: DC | PRN

## 2022-08-03 MED ORDER — DEXAMETHASONE SODIUM PHOSPHATE 10 MG/ML IJ SOLN
INTRAMUSCULAR | Status: AC
Start: 2022-08-03 — End: ?
  Filled 2022-08-03: qty 1

## 2022-08-03 MED ORDER — ACETAMINOPHEN 500 MG PO TABS
ORAL_TABLET | ORAL | Status: AC
  Filled 2022-08-03: qty 2

## 2022-08-03 MED ORDER — ONDANSETRON HCL 4 MG/2ML IJ SOLN
INTRAMUSCULAR | Status: AC
Start: 2022-08-03 — End: ?
  Filled 2022-08-03: qty 2

## 2022-08-03 MED ORDER — MORPHINE SULFATE (PF) 2 MG/ML IV SOLN: 2.0000 mg | INTRAVENOUS | Status: DC | PRN

## 2022-08-03 MED ORDER — ACETAMINOPHEN 650 MG RE SUPP: 650.0000 mg | RECTAL | Status: DC | PRN

## 2022-08-03 MED ORDER — PROPOFOL 10 MG/ML IV BOLUS
INTRAVENOUS | Status: DC | PRN
  Administered 2022-08-03: 120 mg via INTRAVENOUS

## 2022-08-03 MED ORDER — SUGAMMADEX SODIUM 500 MG/5ML IV SOLN
INTRAVENOUS | Status: AC
  Filled 2022-08-03: qty 5

## 2022-08-03 MED ORDER — ONDANSETRON HCL 4 MG/2ML IJ SOLN
INTRAMUSCULAR | Status: DC | PRN
  Administered 2022-08-03: 4 mg via INTRAVENOUS

## 2022-08-03 MED ORDER — ACETAMINOPHEN 500 MG PO TABS
1000.0000 mg | ORAL_TABLET | Freq: Once | ORAL | Status: AC
  Administered 2022-08-03: 1000 mg via ORAL

## 2022-08-03 MED ORDER — INSULIN ASPART 100 UNIT/ML IJ SOLN
0.0000 [IU] | Freq: Three times a day (TID) | INTRAMUSCULAR | Status: DC
  Administered 2022-08-04: 2 [IU] via SUBCUTANEOUS

## 2022-08-03 MED ORDER — LIDOCAINE-EPINEPHRINE 1 %-1:100000 IJ SOLN
INTRAMUSCULAR | Status: AC
  Filled 2022-08-03: qty 1

## 2022-08-03 MED ORDER — BISACODYL 5 MG PO TBEC
10.0000 mg | DELAYED_RELEASE_TABLET | Freq: Every day | ORAL | Status: DC | PRN
Start: 2022-08-03 — End: 2022-08-04

## 2022-08-03 MED ORDER — PHENOL 1.4 % MT LIQD: 1.0000 | OROMUCOSAL | Status: DC | PRN

## 2022-08-03 MED ORDER — PANTOPRAZOLE SODIUM 40 MG IV SOLR
40.0000 mg | Freq: Every day | INTRAVENOUS | Status: DC
  Administered 2022-08-03: 40 mg via INTRAVENOUS
  Filled 2022-08-03: qty 10

## 2022-08-03 MED ORDER — BUPIVACAINE HCL (PF) 0.5 % IJ SOLN
INTRAMUSCULAR | Status: AC
  Filled 2022-08-03: qty 30

## 2022-08-03 MED ORDER — THROMBIN 5000 UNITS EX SOLR
CUTANEOUS | Status: AC
  Filled 2022-08-03: qty 5000

## 2022-08-03 MED ORDER — DEXAMETHASONE SODIUM PHOSPHATE 10 MG/ML IJ SOLN
INTRAMUSCULAR | Status: DC | PRN
  Administered 2022-08-03: 10 mg via INTRAVENOUS

## 2022-08-03 MED ORDER — PANTOPRAZOLE SODIUM 40 MG PO TBEC: 40.0000 mg | DELAYED_RELEASE_TABLET | Freq: Every day | ORAL | Status: DC

## 2022-08-03 MED ORDER — EPHEDRINE 5 MG/ML INJ
INTRAVENOUS | Status: AC
  Filled 2022-08-03: qty 5

## 2022-08-03 MED ORDER — BUPIVACAINE HCL (PF) 0.5 % IJ SOLN
INTRAMUSCULAR | Status: DC | PRN
  Administered 2022-08-03: 5 mL

## 2022-08-03 MED ORDER — EPHEDRINE SULFATE-NACL 50-0.9 MG/10ML-% IV SOSY
PREFILLED_SYRINGE | INTRAVENOUS | Status: DC | PRN
  Administered 2022-08-03: 5 mg via INTRAVENOUS
  Administered 2022-08-03 (×2): 10 mg via INTRAVENOUS

## 2022-08-03 MED ORDER — METHOCARBAMOL 1000 MG/10ML IJ SOLN: 500.0000 mg | Freq: Four times a day (QID) | INTRAVENOUS | Status: DC | PRN

## 2022-08-03 MED ORDER — ONDANSETRON HCL 4 MG PO TABS: 4.0000 mg | ORAL_TABLET | Freq: Four times a day (QID) | ORAL | Status: DC | PRN

## 2022-08-03 MED ORDER — PHENYLEPHRINE HCL-NACL 20-0.9 MG/250ML-% IV SOLN
INTRAVENOUS | Status: DC | PRN
  Administered 2022-08-03: 50 ug/min via INTRAVENOUS

## 2022-08-03 MED ORDER — 0.9 % SODIUM CHLORIDE (POUR BTL) OPTIME
TOPICAL | Status: DC | PRN
  Administered 2022-08-03: 1000 mL

## 2022-08-03 MED ORDER — LACTATED RINGERS IV SOLN: INTRAVENOUS | Status: DC

## 2022-08-03 MED ORDER — PROPOFOL 10 MG/ML IV BOLUS
INTRAVENOUS | Status: AC
  Filled 2022-08-03: qty 20

## 2022-08-03 MED ORDER — FENTANYL CITRATE (PF) 250 MCG/5ML IJ SOLN
INTRAMUSCULAR | Status: AC
  Filled 2022-08-03: qty 5

## 2022-08-03 MED ORDER — CEFAZOLIN SODIUM-DEXTROSE 2-4 GM/100ML-% IV SOLN
2.0000 g | INTRAVENOUS | Status: AC
  Administered 2022-08-03: 2 g via INTRAVENOUS
  Filled 2022-08-03: qty 100

## 2022-08-03 MED ORDER — METHOCARBAMOL 500 MG PO TABS
500.0000 mg | ORAL_TABLET | Freq: Four times a day (QID) | ORAL | Status: DC | PRN
  Administered 2022-08-03: 500 mg via ORAL
  Filled 2022-08-03: qty 1

## 2022-08-03 MED ORDER — SODIUM CHLORIDE 0.9 % IV SOLN: INTRAVENOUS | Status: DC

## 2022-08-03 MED ORDER — FENTANYL CITRATE (PF) 250 MCG/5ML IJ SOLN
INTRAMUSCULAR | Status: DC | PRN
Start: 2022-08-03 — End: 2022-08-03
  Administered 2022-08-03 (×2): 25 ug via INTRAVENOUS
  Administered 2022-08-03: 50 ug via INTRAVENOUS
  Administered 2022-08-03: 100 ug via INTRAVENOUS
  Administered 2022-08-03 (×2): 25 ug via INTRAVENOUS

## 2022-08-03 MED ORDER — SENNA 8.6 MG PO TABS
1.0000 | ORAL_TABLET | Freq: Two times a day (BID) | ORAL | Status: DC
Start: 2022-08-03 — End: 2022-08-04
  Administered 2022-08-03 – 2022-08-04 (×2): 8.6 mg via ORAL
  Filled 2022-08-03 (×2): qty 1

## 2022-08-03 MED ORDER — HYDROMORPHONE HCL 1 MG/ML IJ SOLN
INTRAMUSCULAR | Status: AC
  Filled 2022-08-03: qty 1

## 2022-08-03 MED ORDER — SUGAMMADEX SODIUM 200 MG/2ML IV SOLN
INTRAVENOUS | Status: DC | PRN
  Administered 2022-08-03: 224 mg via INTRAVENOUS

## 2022-08-03 MED ORDER — MIDAZOLAM HCL 2 MG/2ML IJ SOLN
INTRAMUSCULAR | Status: DC | PRN
  Administered 2022-08-03: 2 mg via INTRAVENOUS

## 2022-08-03 MED ORDER — ONDANSETRON HCL 4 MG/2ML IJ SOLN
INTRAMUSCULAR | Status: AC
  Filled 2022-08-03: qty 2

## 2022-08-03 MED ORDER — ORAL CARE MOUTH RINSE: 15.0000 mL | Freq: Once | OROMUCOSAL | Status: AC

## 2022-08-03 MED ORDER — AMLODIPINE BESYLATE 10 MG PO TABS: 10.0000 mg | ORAL_TABLET | Freq: Every day | ORAL | Status: DC

## 2022-08-03 MED ORDER — ROCURONIUM BROMIDE 10 MG/ML (PF) SYRINGE
PREFILLED_SYRINGE | INTRAVENOUS | Status: AC
Start: 2022-08-03 — End: ?
  Filled 2022-08-03: qty 10

## 2022-08-03 MED ORDER — LIDOCAINE 2% (20 MG/ML) 5 ML SYRINGE
INTRAMUSCULAR | Status: DC | PRN
  Administered 2022-08-03: 60 mg via INTRAVENOUS

## 2022-08-03 MED ORDER — BISACODYL 10 MG RE SUPP: 10.0000 mg | Freq: Every day | RECTAL | Status: DC | PRN

## 2022-08-03 MED ORDER — MENTHOL 3 MG MT LOZG: 1.0000 | LOZENGE | OROMUCOSAL | Status: DC | PRN

## 2022-08-03 MED ORDER — SODIUM CHLORIDE 0.9 % IV SOLN: 250.0000 mL | INTRAVENOUS | Status: DC

## 2022-08-03 MED ORDER — LIDOCAINE 2% (20 MG/ML) 5 ML SYRINGE
INTRAMUSCULAR | Status: AC
  Filled 2022-08-03: qty 5

## 2022-08-03 MED ORDER — METOPROLOL SUCCINATE ER 50 MG PO TB24
50.0000 mg | ORAL_TABLET | Freq: Every day | ORAL | Status: DC
  Administered 2022-08-04: 50 mg via ORAL
  Filled 2022-08-03: qty 1

## 2022-08-03 MED ORDER — ACETAMINOPHEN 325 MG PO TABS
650.0000 mg | ORAL_TABLET | ORAL | Status: DC | PRN
  Administered 2022-08-03: 650 mg via ORAL
  Filled 2022-08-03: qty 2

## 2022-08-03 MED ORDER — ALLOPURINOL 100 MG PO TABS
100.0000 mg | ORAL_TABLET | Freq: Every day | ORAL | Status: DC
  Administered 2022-08-03 – 2022-08-04 (×2): 100 mg via ORAL
  Filled 2022-08-03 (×2): qty 1

## 2022-08-03 SURGICAL SUPPLY — 81 items
ADH SKN CLS APL DERMABOND .7 (GAUZE/BANDAGES/DRESSINGS) ×4
APL SKNCLS STERI-STRIP NONHPOA (GAUZE/BANDAGES/DRESSINGS)
BAG COUNTER SPONGE SURGICOUNT (BAG) ×2 IMPLANT
BAG SPNG CNTER NS LX DISP (BAG) ×4
BASKET BONE COLLECTION (BASKET) ×3 IMPLANT
BENZOIN TINCTURE PRP APPL 2/3 (GAUZE/BANDAGES/DRESSINGS) IMPLANT
BLADE CLIPPER SURG (BLADE) IMPLANT
BLADE SURG 11 STRL SS (BLADE) ×2 IMPLANT
BUR 14 MATCH 3 (BUR) IMPLANT
BUR MATCHSTICK NEURO 3.0 LAGG (BURR) ×2 IMPLANT
BUR MR8 14CM BALL SYMTRI 5 (BUR) IMPLANT
BUR PRECISION FLUTE 5.0 (BURR) ×2 IMPLANT
BURR 14 MATCH 3 (BUR) ×2
BURR MR8 14CM BALL SYMTRI 5 (BUR) ×2
CAGE EXP CATALYFT 9 (Plate) IMPLANT
CANISTER SUCT 3000ML PPV (MISCELLANEOUS) ×2 IMPLANT
CARTRIDGE OIL MAESTRO DRILL (MISCELLANEOUS) ×3 IMPLANT
CNTNR URN SCR LID CUP LEK RST (MISCELLANEOUS) ×2 IMPLANT
CONT SPEC 4OZ STRL OR WHT (MISCELLANEOUS) ×2
COVER BACK TABLE 60X90IN (DRAPES) ×12 IMPLANT
COVERAGE SUPPORT O-ARM STEALTH (MISCELLANEOUS) ×2 IMPLANT
DERMABOND ADVANCED (GAUZE/BANDAGES/DRESSINGS) ×4
DERMABOND ADVANCED .7 DNX12 (GAUZE/BANDAGES/DRESSINGS) ×2 IMPLANT
DIFFUSER DRILL AIR PNEUMATIC (MISCELLANEOUS) ×2 IMPLANT
DRAPE C-ARM 42X72 X-RAY (DRAPES) ×3 IMPLANT
DRAPE C-ARMOR (DRAPES) ×3 IMPLANT
DRAPE LAPAROTOMY 100X72X124 (DRAPES) ×3 IMPLANT
DRAPE SCAN PATIENT (DRAPES) ×3 IMPLANT
DRAPE SURG 17X23 STRL (DRAPES) ×2 IMPLANT
DRSG OPSITE POSTOP 4X6 (GAUZE/BANDAGES/DRESSINGS) IMPLANT
DURAPREP 26ML APPLICATOR (WOUND CARE) ×2 IMPLANT
ELECT REM PT RETURN 9FT ADLT (ELECTROSURGICAL) ×2
ELECTRODE REM PT RTRN 9FT ADLT (ELECTROSURGICAL) ×3 IMPLANT
FEE COVERAGE SUPPORT O-ARM (MISCELLANEOUS) IMPLANT
GAUZE 4X4 16PLY ~~LOC~~+RFID DBL (SPONGE) IMPLANT
GAUZE SPONGE 4X4 12PLY STRL (GAUZE/BANDAGES/DRESSINGS) IMPLANT
GLOVE BIO SURGEON STRL SZ7.5 (GLOVE) IMPLANT
GLOVE BIOGEL PI IND STRL 7.5 (GLOVE) ×6 IMPLANT
GLOVE BIOGEL PI INDICATOR 7.5 (GLOVE) ×4
GLOVE ECLIPSE 7.0 STRL STRAW (GLOVE) ×4 IMPLANT
GLOVE EXAM NITRILE XL STR (GLOVE) IMPLANT
GOWN STRL REUS W/ TWL LRG LVL3 (GOWN DISPOSABLE) ×8 IMPLANT
GOWN STRL REUS W/ TWL XL LVL3 (GOWN DISPOSABLE) IMPLANT
GOWN STRL REUS W/TWL 2XL LVL3 (GOWN DISPOSABLE) IMPLANT
GOWN STRL REUS W/TWL LRG LVL3 (GOWN DISPOSABLE) ×8
GOWN STRL REUS W/TWL XL LVL3 (GOWN DISPOSABLE)
GRAFT BONE PROTEIOS XS 0.5CC (Orthopedic Implant) IMPLANT
HEMOSTAT POWDER KIT SURGIFOAM (HEMOSTASIS) ×2 IMPLANT
KIT BASIN OR (CUSTOM PROCEDURE TRAY) ×2 IMPLANT
KIT POSITION SURG JACKSON T1 (MISCELLANEOUS) ×3 IMPLANT
KIT TURNOVER KIT B (KITS) ×2 IMPLANT
MARKER SPHERE PSV REFLC NDI (MISCELLANEOUS) ×10 IMPLANT
MILL BONE PREP (MISCELLANEOUS) ×3 IMPLANT
NDL HYPO 18GX1.5 BLUNT FILL (NEEDLE) IMPLANT
NDL SPNL 18GX3.5 QUINCKE PK (NEEDLE) IMPLANT
NEEDLE HYPO 18GX1.5 BLUNT FILL (NEEDLE) IMPLANT
NEEDLE HYPO 22GX1.5 SAFETY (NEEDLE) ×2 IMPLANT
NEEDLE SPNL 18GX3.5 QUINCKE PK (NEEDLE) IMPLANT
NS IRRIG 1000ML POUR BTL (IV SOLUTION) ×3 IMPLANT
OIL CARTRIDGE MAESTRO DRILL (MISCELLANEOUS) ×2
PACK LAMINECTOMY NEURO (CUSTOM PROCEDURE TRAY) ×3 IMPLANT
PAD ARMBOARD 7.5X6 YLW CONV (MISCELLANEOUS) ×6 IMPLANT
PIN BONE FIX 100 (PIN) IMPLANT
PUTTY GRAFTON DBF 9CC W/DELIVE (Putty) IMPLANT
ROD COBALT 47.5X35 (Rod) IMPLANT
SCREW SET SOLERA (Screw) ×8 IMPLANT
SCREW SET SOLERA TI (Screw) IMPLANT
SCREW SOLERA 6.5X35 (Screw) IMPLANT
SPIKE FLUID TRANSFER (MISCELLANEOUS) ×3 IMPLANT
SPONGE SURGIFOAM ABS GEL 100 (HEMOSTASIS) IMPLANT
SPONGE T-LAP 4X18 ~~LOC~~+RFID (SPONGE) IMPLANT
STRIP CLOSURE SKIN 1/2X4 (GAUZE/BANDAGES/DRESSINGS) IMPLANT
SUPPORT TECH COVERAGE MED NAV (MISCELLANEOUS) ×2
SUT VIC AB 0 CT1 18XCR BRD8 (SUTURE) ×2 IMPLANT
SUT VIC AB 0 CT1 8-18 (SUTURE) ×4
SUT VICRYL 3-0 RB1 18 ABS (SUTURE) ×2 IMPLANT
SYR 3ML LL SCALE MARK (SYRINGE) ×6 IMPLANT
TOWEL GREEN STERILE (TOWEL DISPOSABLE) ×2 IMPLANT
TOWEL GREEN STERILE FF (TOWEL DISPOSABLE) ×2 IMPLANT
TRAY FOLEY MTR SLVR 16FR STAT (SET/KITS/TRAYS/PACK) ×2 IMPLANT
WATER STERILE IRR 1000ML POUR (IV SOLUTION) ×3 IMPLANT

## 2022-08-03 NOTE — Anesthesia Postprocedure Evaluation (Signed)
Anesthesia Post Note  Patient: LAKAI MOREE  Procedure(s) Performed: Posterior Lumbar Interbody Fusion Lumbar four-five (Back) Application of O-Arm     Patient location during evaluation: PACU Anesthesia Type: General Level of consciousness: awake and alert Pain management: pain level controlled Vital Signs Assessment: post-procedure vital signs reviewed and stable Respiratory status: spontaneous breathing, nonlabored ventilation and respiratory function stable Cardiovascular status: blood pressure returned to baseline and stable Postop Assessment: no apparent nausea or vomiting Anesthetic complications: no   No notable events documented.  Last Vitals:  Vitals:   08/03/22 1730 08/03/22 1745  BP: 103/73 101/72  Pulse: 94 74  Resp: 16 18  Temp:    SpO2: 96% 97%    Last Pain:  Vitals:   08/03/22 1700  TempSrc:   PainSc: 7                  Shawndale Kilpatrick,W. EDMOND

## 2022-08-03 NOTE — Plan of Care (Signed)

## 2022-08-03 NOTE — H&P (Signed)
Chief Complaint   Back and leg pain  History of Present Illness  Gary Frederick is a 66 y.o. male with a history of relatively chronic back and left greater than right leg pain consistent with neurogenic claudication related to multifactorial stenosis at L4-5.  Patient has attempted multiple different conservative treatments including epidural steroid injections without lasting improvement in his symptoms.  He therefore elected to proceed with surgical decompression and fusion.  Past Medical History   Past Medical History:  Diagnosis Date   Arthritis    Cancer ([place]) 10/03/2012   colon   Constipation    Diabetes mellitus without complication ([place])    Dysrhythmia    A fib   Gout    Hyperlipidemia    Hypertension    Insomnia    Stroke ([place]) 03/15/2022    Past Surgical History   Past Surgical History:  Procedure Laterality Date   CARPAL TUNNEL RELEASE     right   COLON RESECTION  12/11/2012   Procedure: LAPAROSCOPIC RIGHT COLON RESECTION;  Surgeon: Leighton Ruff, MD;  Location: [facility];  Service: General;  Laterality: Right;   COLON SURGERY  12/11/2012   KNEE ARTHROSCOPY     LIVER ULTRASOUND  12/11/2012   Procedure: LIVER ULTRASOUND;  Surgeon: Leighton Ruff, MD;  Location: [facility];  Service: General;  Laterality: Right;  Intraoperative Liver Ultrasound    Social History   Social History   Tobacco Use   Smoking status: Every Day    Packs/day: 0.50    Years: 30.00    Total pack years: 15.00    Types: Cigarettes   Smokeless tobacco: Never  Vaping Use   Vaping Use: Never used  Substance Use Topics   Alcohol use: Not Currently    Alcohol/week: 5.0 standard drinks of alcohol    Types: 5 Cans of beer per week   Drug use: No    Medications   Prior to Admission medications   Medication Sig Start Date End Date Taking? Authorizing Provider  acetaminophen (TYLENOL) 500 MG tablet Take 500 mg by mouth every 6 (six) hours as needed.   Yes [provider]   allopurinol (ZYLOPRIM) 100 MG tablet Take 100 mg by mouth daily. 09/29/21  Yes [provider]  amLODipine (NORVASC) 10 MG tablet Take 1 tablet (10 mg total) by mouth daily with lunch. 03/18/22  Yes Eugenie Filler, MD  apixaban (ELIQUIS) 5 MG TABS tablet Take 1 tablet (5 mg total) by mouth 2 (two) times daily. 03/17/22  Yes Eugenie Filler, MD  atorvastatin (LIPITOR) 40 MG tablet Take 1 tablet (40 mg total) by mouth daily. 03/18/22  Yes Eugenie Filler, MD  bisacodyl (DULCOLAX) 5 MG EC tablet Take 10 mg by mouth daily as needed for moderate constipation.   Yes [provider]  esomeprazole (NEXIUM) 20 MG capsule Take 20 mg by mouth daily as needed (acid reflux).   Yes [provider]  fluticasone (FLONASE) 50 MCG/ACT nasal spray Place 2 sprays into both nostrils daily as needed for allergies. 03/28/22  Yes [provider]  metoprolol succinate (TOPROL-XL) 50 MG 24 hr tablet Take 50 mg by mouth daily.  06/08/13  Yes [provider]  nicotine (NICODERM CQ - DOSED IN MG/24 HR) 7 mg/24hr patch Place 1 patch (7 mg total) onto the skin daily. Patient taking differently: Place 7 mg onto the skin daily as needed (nicotine dependence). 07/05/22  Yes Patwardhan, Reynold Bowen, MD  omega-3 acid ethyl esters (LOVAZA)  1 G capsule Take 2 g by mouth daily.  05/28/13  Yes [provider]  OZEMPIC, 0.25 OR 0.5 MG/DOSE, 2 MG/1.5ML SOPN Inject 0.5 mg into the skin every Thursday. 09/22/21  Yes [provider]  Polyvinyl Alcohol-Povidone (REFRESH OP) Place 1 drop into both eyes daily.   Yes [provider]  diclofenac Sodium (VOLTAREN) 1 % GEL Apply 1 Application topically 4 (four) times daily as needed (pain).    [provider]  ONE TOUCH ULTRA TEST test strip USE 1 STRIP TO [place] DAILY 12/31/18   [provider]    Allergies  No Known Allergies  Review of Systems  ROS  Neurologic Exam  Awake, alert,  oriented Memory and concentration grossly intact Speech fluent, appropriate CN grossly intact Motor exam: Upper Extremities Deltoid Bicep Tricep Grip  Right 5/5 5/5 5/5 5/5  Left 5/5 5/5 5/5 5/5   Lower Extremities IP Quad PF DF EHL  Right 5/5 5/5 5/5 5/5 5/5  Left 5/5 5/5 5/5 5/5 5/5   Sensation grossly intact to LT  Impression  - 66 y.o. male with primarily left greater than right leg pain consistent with neurogenic claudication related to multifactorial stenosis at L4-5.  He has not had significant improvement with conservative treatment.  Plan  -We will plan on proceeding with decompression and fusion at L4-5.  I have reviewed the details of the operation at length with the patient and his wife in the office.  We have discussed the expected postoperative course and recovery.  We have also reviewed the associated risks, benefits, and alternatives to the surgery.  All her questions today were answered and he provided informed consent to proceed.   Consuella Lose, MD [facility]

## 2022-08-03 NOTE — Op Note (Signed)
NEUROSURGERY OPERATIVE NOTE   PREOP DIAGNOSIS:  1. Lumbar spondylosis, L4-5 2. Lumbar spinal stenosis with neurogenic claudication  POSTOP DIAGNOSIS: Same  PROCEDURE: 1. L4 laminectomy with facetectomy for decompression of exiting nerve roots, more than would be required for placement of interbody graft 2. Placement of anterior interbody device - Medtronic Rise 33m expandable cages x2 3. Posterior non-segmental instrumentation using cortical pedicle screws at L4 - L5 4. Interbody arthrodesis, L4-5 5. Use of locally harvested bone autograft 6. Use of non-structural bone allograft - DBM, ProteiOs 7. Use of intraoperative stereotactic navigation  SURGEON: Dr. NConsuella Lose MD  ASSISTANT: Dr. HCharlie Pitter MD  ANESTHESIA: General Endotracheal  EBL: 200cc  SPECIMENS: None  DRAINS: None  COMPLICATIONS: None immediate  CONDITION: Hemodynamically stable to PACU  HISTORY: Gary CORRADIis a 66y.o. male who has been followed in the outpatient clinic with relatively chronic back and leg pain related to spondylosis with associated central and lateral recess stenosis at L4-5. Patient has attempted multiple different conservative treatments without lasting improvement.  He therefore elected to proceed with surgical decompression and fusion.  Risks, benefits, and alternative treatments were reviewed in detail in the office. After all questions were answered, informed consent was obtained and witnessed.  PROCEDURE IN DETAIL: The patient was brought to the operating room via stretcher. After induction of general anesthesia, the patient was positioned on the operative table in the prone position. All pressure points were meticulously padded. Incision was then marked out and prepped and draped in the usual sterile fashion.  After timeout was conducted, skin was infiltrated with local anesthetic.  Spinal needle was introduced in order to identify the surface projection of the L4-5  disc space.  Skin incision was then made sharply and Bovie electrocautery was used to dissect the subcutaneous tissue until the lumbodorsal fascia was identified and incised. The muscle was then elevated in the subperiosteal plane and the L4 lamina and L4-5 facet complexes were identified. Self-retaining retractors were then placed. Lateral fluoroscopy was taken with a dissector in the L4-5 interspace to confirm our location.  At this point attention was turned to decompression. Complete L4 laminectomy was completed with a high-speed drill and Kerrison punches.  Normal dura was identified.  A ball-tipped dissector was then used to identify the foramina bilaterally.  High-speed drill was used to cut across the pars interarticularis and the inferior articulating process of L4 was removed bilaterally.  Kerrison punches were then used to remove the superior portion of the L5 lamina as well as the medial and superior aspect of the superior articulating process of L5.  This allowed complete unroofing of the L4-5 foramen in order to fully decompress the exiting L4 nerve roots, as well as the lateral recess and decompression of the L5 nerve roots.  At this point, reference array was attached to a posterior superior iliac spine pin.  Intraoperative CT scan was taken for navigation purposes.  Accuracy was noted to be adequate.  Stereotactic system was then used to create pilot holes for cortical trajectory pedicle screws bilaterally at L4 and L5.  These holes were then checked with a sound, tapped to 5.5 x 35 mm, and then filled with Floseal.  Disc space was then identified, incised bilaterally, and using a combination of shavers, curettes and rongeurs, complete discectomy was completed. Endplates were prepared with curettes, and bone harvested during decompression was mixed with DBM and ProteiOs and packed into the interspace. 925mexpandable cages were tapped into place  bilaterally utilizing navigated assistance.   Cages were expanded to achieve good endplate apposition.    At this point 6.5 x 35 mm screws were then placed into the previously created pilot holes.  Prebent lordotic rod was then sized and placed into the pedicle screws. Set screws were placed and final tightened. Final AP and lateral fluoroscopic images confirmed good position.  Hemostasis was secured and confirmed with bipolar cautery and morcellized gelfoam with thrombin. The wound was then irrigated with copious amounts of antibiotic saline, then closed in standard fashion using a combination of interrupted 0 and 3-0 Vicryl stitches in the muscular, fascial, and subcutaneous layers.  The right-sided PSIS pin was removed.  Skin was then closed using standard Dermabond. Sterile dressing was then applied. The patient was then transferred to the stretcher, extubated, and taken to the postanesthesia care unit in stable hemodynamic condition.  At the end of the case all sponge, needle, cottonoid, and instrument counts were correct.   Consuella Lose, MD [facility]

## 2022-08-03 NOTE — Transfer of Care (Signed)
Immediate Anesthesia Transfer of Care Note  Patient: Gary Frederick  Procedure(s) Performed: Posterior Lumbar Interbody Fusion Lumbar four-five (Back) Application of O-Arm  Patient Location: PACU  Anesthesia Type:General  Level of Consciousness: awake, alert  and oriented  Airway & Oxygen Therapy: Patient Spontanous Breathing and Patient connected to face mask oxygen  Post-op Assessment: Report given to RN, Post -op Vital signs reviewed and stable, Patient moving all extremities X 4 and Patient able to stick tongue midline  Post vital signs: Reviewed  Last Vitals:  Vitals Value Taken Time  BP 126/67 08/03/22 1600  Temp 97.6   Pulse 101 08/03/22 1605  Resp 30 08/03/22 1605  SpO2 94 % 08/03/22 1605  Vitals shown include unvalidated device data.  Last Pain:  Vitals:   08/03/22 0930  TempSrc:   PainSc: 6          Complications: No notable events documented.

## 2022-08-03 NOTE — Progress Notes (Signed)
DOrthopedic Tech Progress Note Patient Details:  Gary Frederick October 03, 1956 [id number]  Ortho Devices Type of Ortho Device: Lumbar corsett Ortho Device/Splint Interventions: Ordered     Dropped LSO off with PACU RN. Vernona Rieger 08/03/2022, 4:26 PM

## 2022-08-03 NOTE — Anesthesia Procedure Notes (Signed)
Procedure Name: Intubation Date/Time: 08/03/2022 11:56 AM  Performed by: Imagene Riches, CRNAPre-anesthesia Checklist: Patient identified, Emergency Drugs available, Suction available and Patient being monitored Patient Re-evaluated:Patient Re-evaluated prior to induction Oxygen Delivery Method: Circle System Utilized Preoxygenation: Pre-oxygenation with 100% oxygen Induction Type: IV induction Ventilation: Mask ventilation without difficulty Laryngoscope Size: Miller and 2 Grade View: Grade II Tube type: Oral Tube size: 7.5 mm Number of attempts: 1 Airway Equipment and Method: Stylet and Oral airway Placement Confirmation: ETT inserted through vocal cords under direct vision, positive ETCO2 and breath sounds checked- equal and bilateral Secured at: 22 cm Tube secured with: Tape Dental Injury: Teeth and Oropharynx as per pre-operative assessment

## 2022-08-04 DIAGNOSIS — Z8673 Personal history of transient ischemic attack (TIA), and cerebral infarction without residual deficits: Secondary | ICD-10-CM | POA: Diagnosis not present

## 2022-08-04 DIAGNOSIS — F17210 Nicotine dependence, cigarettes, uncomplicated: Secondary | ICD-10-CM | POA: Diagnosis not present

## 2022-08-04 DIAGNOSIS — I4891 Unspecified atrial fibrillation: Secondary | ICD-10-CM | POA: Diagnosis not present

## 2022-08-04 DIAGNOSIS — M47896 Other spondylosis, lumbar region: Secondary | ICD-10-CM | POA: Diagnosis not present

## 2022-08-04 DIAGNOSIS — M48062 Spinal stenosis, lumbar region with neurogenic claudication: Secondary | ICD-10-CM | POA: Diagnosis not present

## 2022-08-04 DIAGNOSIS — E119 Type 2 diabetes mellitus without complications: Secondary | ICD-10-CM | POA: Diagnosis not present

## 2022-08-04 DIAGNOSIS — Z85038 Personal history of other malignant neoplasm of large intestine: Secondary | ICD-10-CM | POA: Diagnosis not present

## 2022-08-04 DIAGNOSIS — Z7901 Long term (current) use of anticoagulants: Secondary | ICD-10-CM | POA: Diagnosis not present

## 2022-08-04 DIAGNOSIS — M47816 Spondylosis without myelopathy or radiculopathy, lumbar region: Secondary | ICD-10-CM | POA: Diagnosis not present

## 2022-08-04 DIAGNOSIS — G8929 Other chronic pain: Secondary | ICD-10-CM | POA: Diagnosis not present

## 2022-08-04 DIAGNOSIS — I10 Essential (primary) hypertension: Secondary | ICD-10-CM | POA: Diagnosis not present

## 2022-08-04 DIAGNOSIS — Z79899 Other long term (current) drug therapy: Secondary | ICD-10-CM | POA: Diagnosis not present

## 2022-08-04 LAB — GLUCOSE, CAPILLARY: Glucose-Capillary: 141 mg/dL — ABNORMAL HIGH (ref 70–99)

## 2022-08-04 MED ORDER — OXYCODONE-ACETAMINOPHEN 5-325 MG PO TABS: 1.0000 | ORAL_TABLET | ORAL | 0 refills | Status: DC | PRN

## 2022-08-04 MED ORDER — DOCUSATE SODIUM 100 MG PO CAPS: 100.0000 mg | ORAL_CAPSULE | Freq: Two times a day (BID) | ORAL | 0 refills | Status: AC

## 2022-08-04 MED ORDER — METHOCARBAMOL 500 MG PO TABS: 500.0000 mg | ORAL_TABLET | Freq: Four times a day (QID) | ORAL | 1 refills | Status: DC | PRN

## 2022-08-04 MED ORDER — APIXABAN 5 MG PO TABS: 5.0000 mg | ORAL_TABLET | Freq: Two times a day (BID) | ORAL | 0 refills | Status: DC

## 2022-08-04 NOTE — Plan of Care (Signed)
  Problem: Metabolic: Goal: Ability to maintain appropriate glucose levels will improve Outcome: Progressing   Problem: Education: Goal: Knowledge of General Education information will improve Description: Including pain rating scale, medication(s)/side effects and non-pharmacologic comfort measures Outcome: Progressing   Problem: Health Behavior/Discharge Planning: Goal: Ability to manage health-related needs will improve Outcome: Progressing   Problem: Nutrition: Goal: Adequate nutrition will be maintained Outcome: Progressing   Problem: Pain Managment: Goal: General experience of comfort will improve Outcome: Progressing

## 2022-08-04 NOTE — Discharge Instructions (Signed)
Wound Care Keep incision covered and dry for two days.    Do not put any creams, lotions, or ointments on incision. Leave steri-strips on back.  They will fall off by themselves. You are fine to shower. Let water run over incision and pat dry.  Activity Walk each and every day, increasing distance each day. No lifting greater than 5 lbs.  Avoid excessive back motion. No driving for 2 weeks; may ride as a passenger locally.  Diet Resume your normal diet.   Return to Work Will be discussed at your follow up appointment.  Call Your Doctor If Any of These Occur Redness, drainage, or swelling at the wound.  Temperature greater than 101 degrees. Severe pain not relieved by pain medication. Incision starts to come apart.  Follow Up Appt Call [phone] today for appointment in 3-4 weeks if you don't already have one or for any problems.  If you have any hardware placed in your spine, you will need an x-ray before your appointment.

## 2022-08-04 NOTE — Progress Notes (Signed)
Received referral to assist with DME (RW). Met with pt and wife. He plans to return home with the support of his wife. Contacted Jasmine with [facility] for DME referral.

## 2022-08-04 NOTE — Progress Notes (Signed)
Patient discharge teaching given, including activity, diet, follow-up appoints, and medications. Patient verbalized understanding of all discharge instructions. IV access was d/c'd. Vitals are stable. Skin is intact except as charted in most recent assessments. Pt to be escorted out by NT, to be driven home by family. 

## 2022-08-04 NOTE — Discharge Summary (Signed)
Physician Discharge Summary     Providing Compassionate, Quality Care - Together   Patient ID: Gary Frederick MRN: [id number] DOB/AGE: 64-Sep-1957 64 y.o.  Admit date: 08/03/2022 Discharge date: 08/04/2022  Admission Diagnoses: Lumbar spinal stenosis  Discharge Diagnoses:  Principal Problem:   Lumbar spinal stenosis   Discharged Condition: good  Hospital Course: Patient underwent an L4-5 PLIF  by Dr. Kathyrn Sheriff on 08/03/2022. He was admitted to 5N22 following recovery from anesthesia in the PACU. His postoperative course has been uncomplicated. He has worked with both physical and occupational therapies who feel the patient is ready for discharge home. He is ambulating independently with the aid of a walker. He is tolerating a normal diet. He is not having any bowel or bladder dysfunction. His pain is reasonably controlled with oral pain medication. He is ready for discharge home.   Consults: PT/OT  Significant Diagnostic Studies: radiology: DG Lumbar Spine 2-3 Views  Result Date: 08/03/2022 CLINICAL DATA:  Posterior lumbar interbody fusion L4-5. EXAM: LUMBAR SPINE - 2-3 VIEW COMPARISON:  Lumbar spine radiographs 07/11/2021 FINDINGS: Images were performed intraoperatively without the presence of a radiologist. Frontal and lateral views demonstrate L4-5 bilateral transpedicular rod and screw fusion with associated intervertebral disc spacer. Total fluoroscopy images: 2 Total fluoroscopy time: 7 seconds Total dose: Radiation Exposure Index (as provided by the fluoroscopic device): 4.87 mGy air Kerma Please see intraoperative findings for further detail. IMPRESSION: Intraoperative fluoroscopy for posterior instrumented fusion. Electronically Signed   By: Yvonne Kendall M.D.   On: 08/03/2022 16:23   DG C-Arm 1-60 Min-No Report  Result Date: 08/03/2022 Fluoroscopy was utilized by the requesting physician.  No radiographic interpretation.   DG C-Arm 1-60 Min-No Report  Result Date:  08/03/2022 Fluoroscopy was utilized by the requesting physician.  No radiographic interpretation.   DG C-Arm 1-60 Min-No Report  Result Date: 08/03/2022 Fluoroscopy was utilized by the requesting physician.  No radiographic interpretation.   DG C-Arm 1-60 Min-No Report  Result Date: 08/03/2022 Fluoroscopy was utilized by the requesting physician.  No radiographic interpretation.   DG O-ARM IMAGE ONLY/NO REPORT  Result Date: 08/03/2022 There is no Radiologist interpretation  for this exam.    Treatments: surgery:   1. L4 laminectomy with facetectomy for decompression of exiting nerve roots, more than would be required for placement of interbody graft 2. Placement of anterior interbody device - Medtronic Rise 43m expandable cages x2 3. Posterior non-segmental instrumentation using cortical pedicle screws at L4 - L5 4. Interbody arthrodesis, L4-5 5. Use of locally harvested bone autograft 6. Use of non-structural bone allograft - DBM, ProteiOs 7. Use of intraoperative stereotactic navigation  Discharge Exam: Blood pressure 126/83, pulse 80, temperature 98.3 F (36.8 C), resp. rate 16, height '6\' 3"'$  (1.905 m), weight 112 kg, SpO2 95 %.  Alert and oriented x 4 PERRLA CN II-XII grossly intact MAE, Strength and sensation intact Incision is covered with Honeycomb dressing and Steri Strips; Dressing is clean, dry, and intact   Disposition: Discharge disposition: 01-Home or Self Care       Discharge Instructions     Call MD for:  persistant nausea and vomiting   Complete by: As directed    Call MD for:  redness, tenderness, or signs of infection (pain, swelling, redness, odor or green/yellow discharge around incision site)   Complete by: As directed    Call MD for:  severe uncontrolled pain   Complete by: As directed    Call MD for:  temperature >100.4  Complete by: As directed    Diet - low sodium heart healthy   Complete by: As directed    For home use only DME Walker  rolling   Complete by: As directed    Walker: With [place]   Patient needs a walker to treat with the following condition: Lumbar spinal stenosis   Incentive spirometry RT   Complete by: As directed    Increase activity slowly   Complete by: As directed    No wound care   Complete by: As directed    Remove dressing in 24 hours   Complete by: As directed       Allergies as of 08/04/2022   No Known Allergies      Medication List     TAKE these medications    acetaminophen 500 MG tablet Commonly known as: TYLENOL Take 500 mg by mouth every 6 (six) hours as needed.   allopurinol 100 MG tablet Commonly known as: ZYLOPRIM Take 100 mg by mouth daily.   amLODipine 10 MG tablet Commonly known as: NORVASC Take 1 tablet (10 mg total) by mouth daily with lunch.   apixaban 5 MG Tabs tablet Commonly known as: ELIQUIS Take 1 tablet (5 mg total) by mouth 2 (two) times daily. **Restart on 08/08/2022** What changed: additional instructions   atorvastatin 40 MG tablet Commonly known as: LIPITOR Take 1 tablet (40 mg total) by mouth daily.   bisacodyl 5 MG EC tablet Commonly known as: DULCOLAX Take 10 mg by mouth daily as needed for moderate constipation.   diclofenac Sodium 1 % Gel Commonly known as: VOLTAREN Apply 1 Application topically 4 (four) times daily as needed (pain).   docusate sodium 100 MG capsule Commonly known as: COLACE Take 1 capsule (100 mg total) by mouth 2 (two) times daily.   esomeprazole 20 MG capsule Commonly known as: NEXIUM Take 20 mg by mouth daily as needed (acid reflux).   fluticasone 50 MCG/ACT nasal spray Commonly known as: FLONASE Place 2 sprays into both nostrils daily as needed for allergies.   methocarbamol 500 MG tablet Commonly known as: ROBAXIN Take 1 tablet (500 mg total) by mouth every 6 (six) hours as needed for muscle spasms.   metoprolol succinate 50 MG 24 hr tablet Commonly known as: TOPROL-XL Take 50 mg by mouth daily.    nicotine 7 mg/24hr patch Commonly known as: NICODERM CQ - dosed in mg/24 hr Place 1 patch (7 mg total) onto the skin daily. What changed:  when to take this reasons to take this   omega-3 acid ethyl esters 1 g capsule Commonly known as: LOVAZA Take 2 g by mouth daily.   ONE TOUCH ULTRA TEST test strip Generic drug: glucose blood USE 1 STRIP TO CHECK GLUCOSE TWICE DAILY   oxyCODONE-acetaminophen 5-325 MG tablet Commonly known as: Percocet Take 1-2 tablets by mouth every 4 (four) hours as needed for severe pain.   Ozempic (0.25 or 0.5 MG/DOSE) 2 MG/1.5ML Sopn Generic drug: Semaglutide(0.25 or 0.'5MG'$ /DOS) Inject 0.5 mg into the skin every Thursday.   REFRESH OP Place 1 drop into both eyes daily.               Durable Medical Equipment  (From admission, onward)           Start     Ordered   08/04/22 0000  For home use only DME Walker rolling       Question Answer Comment  Walker: With [place]  Patient needs a walker to treat with the following condition Lumbar spinal stenosis      08/04/22 1049            Follow-up Information     Consuella Lose, MD. Go on 08/27/2022.   Specialty: Neurosurgery Why: First post op appointment with x-rays is on 08/27/2022 at 11:30 AM. Contact information: [address] [place] [address] [phone]                 Signed: Viona Gilmore, DNP, AGNP-C Nurse Practitioner  [facility] [place] [address], [place], [place] [address] P: [phone]    F: [phone]  08/04/2022, 10:50 AM

## 2022-08-04 NOTE — Evaluation (Signed)
Occupational Therapy Evaluation/Discharge Patient Details Name: Gary Frederick MRN: [id number] DOB: 08/31/1956 Today's Date: 08/04/2022   History of Present Illness Pt is a 66 y/o male admitted for L4-5 decompression and fusion in setting of lumbar spondylosis and spinal stenosis with neurogenic claudication. PMH: colon CA, CVA, DM, gout, HTN   Clinical Impression   PTA, pt lives with spouse, typically Independent in all daily tasks without AD though limited by progressive back pain. Pt presents now s/p procedure above with minor deficits in pain and dynamic standing balance. Pt's wife present and supportive. Educated both on spinal precautions for ADLs, bed mobility, general body mechanics, helpful DME (rec RW for longer distance mobility), use of BSC as shower chair, and LSO mgmt. Pt able to return demo ADLs and short mobility in room without AD without physical assistance. Wife confirms she can assist pt as needed at home. No further skilled OT services needed at this time.      Recommendations for follow up therapy are one component of a multi-disciplinary discharge planning process, led by the attending physician.  Recommendations may be updated based on patient status, additional functional criteria and insurance authorization.   Follow Up Recommendations  No OT follow up    Assistance Recommended at Discharge PRN  Patient can return home with the following Assistance with cooking/housework;Assist for transportation    Functional Status Assessment  Patient has had a recent decline in their functional status and demonstrates the ability to make significant improvements in function in a reasonable and predictable amount of time.  Equipment Recommendations  Other (comment) (RW)    Recommendations for Other Services       Precautions / Restrictions Precautions Precautions: Fall;Back Precaution Booklet Issued: Yes (comment) Required Braces or Orthoses: Spinal Brace Spinal  Brace: Lumbar corset;Applied in sitting position Restrictions Weight Bearing Restrictions: No      Mobility Bed Mobility               General bed mobility comments: sitting EOB on entry    Transfers Overall transfer level: Modified independent Equipment used: None, Rolling walker (2 wheels)                      Balance Overall balance assessment: Mild deficits observed, not formally tested                                         ADL either performed or assessed with clinical judgement   ADL Overall ADL's : Modified independent                                       General ADL Comments: able to don UB/LB clothing with cues for technique and increased time. Able to return demo LSO brace mgmt well and mobilize into bathroom without AD (cautious gait) to wash face at sink. Educated regarding technique for oral care at sink d/t tall stature, recs to sit in shower and where assistance may be needed     Vision Baseline Vision/History: 1 Wears glasses Ability to See in Adequate Light: 0 Adequate Patient Visual Report: No change from baseline Vision Assessment?: No apparent visual deficits     Perception     Praxis      Pertinent Vitals/Pain Pain Assessment Pain Assessment: Faces Faces Pain  Scale: Hurts a little bit Pain Location: low back Pain Descriptors / Indicators: Grimacing Pain Intervention(s): Monitored during session     Hand Dominance Right   Extremity/Trunk Assessment Upper Extremity Assessment Upper Extremity Assessment: Overall WFL for tasks assessed   Lower Extremity Assessment Lower Extremity Assessment: Defer to PT evaluation   Cervical / Trunk Assessment Cervical / Trunk Assessment: Back Surgery   Communication Communication Communication: Other (comment) (some mumbled speech (hx of CVA))   Cognition Arousal/Alertness: Awake/alert Behavior During Therapy: WFL for tasks assessed/performed Overall  Cognitive Status: Within Functional Limits for tasks assessed                                 General Comments: minor cues to implement back precautions but able to recall them consistently     General Comments  Wife present and supportive    Exercises     Shoulder Instructions      Home Living Family/patient expects to be discharged to:: Private residence Living Arrangements: Spouse/significant other Available Help at Discharge: Family;Available 24 hours/day Type of Home: House Home Access: Stairs to enter [employer] of Steps: 1   Home Layout: One level     Bathroom Shower/Tub: Advertising copywriter: Yes   Home Equipment: Toilet riser;BSC/3in1          Prior Functioning/Environment Prior Level of Function : Independent/Modified Independent;Driving             Mobility Comments: indep ADLs Comments: independent        OT Problem List:        OT Treatment/Interventions:      OT Goals(Current goals can be found in the care plan section) Acute Rehab OT Goals Patient Stated Goal: go home asap OT Goal Formulation: All assessment and education complete, DC therapy  OT Frequency:      Co-evaluation              AM-PAC OT "6 Clicks" Daily Activity     Outcome Measure Help from another person eating meals?: None Help from another person taking care of personal grooming?: None Help from another person toileting, which includes using toliet, bedpan, or urinal?: None Help from another person bathing (including washing, rinsing, drying)?: None Help from another person to put on and taking off regular upper body clothing?: None Help from another person to put on and taking off regular lower body clothing?: None 6 Click Score: 24   End of Session Equipment Utilized During Treatment: Rolling walker (2 wheels);Back brace Nurse Communication: Mobility status;Other (comment) (request for IV  removal)  Activity Tolerance: Patient tolerated treatment well Patient left: in bed;with call bell/phone within reach;with family/visitor present  OT Visit Diagnosis: Pain Pain - part of body:  (back)                Time: 0102-7253 OT Time Calculation (min): 29 min Charges:  OT General Charges $OT Visit: 1 Visit OT Evaluation $OT Eval Low Complexity: 1 Low OT Treatments $Self Care/Home Management : 8-22 mins  Malachy Chamber, OTR/L Acute Rehab Services Office: [phone]   Layla Maw 08/04/2022, 10:38 AM

## 2022-08-04 NOTE — Evaluation (Signed)
Physical Therapy Evaluation Patient Details Name: Gary Frederick MRN: [id number] DOB: 04-03-1956 Today's Date: 08/04/2022  History of Present Illness  Pt is a 66 y/o male admitted for L4-5 decompression and fusion in setting of lumbar spondylosis and spinal stenosis with neurogenic claudication. PMH: afib, arthritis, colon CA, CVA, DM, gout, HTN.  Clinical Impression  Pt agreeable to physical therapy evaluation/treatment session. Pt performing all functional mobility at modified independent to independent level. Pt educated on spine precautions, brace use, and appropriate exercises postop. Pt motivated and performed light volume of exercises to ensure his ability to perform correctly (core bracing and mini squats from chair). No further acute care physical therapy needs identified.    Recommendations for follow up therapy are one component of a multi-disciplinary discharge planning process, led by the attending physician.  Recommendations may be updated based on patient status, additional functional criteria and insurance authorization.  Follow Up Recommendations No PT follow up      Assistance Recommended at Discharge PRN  Patient can return home with the following  Assist for transportation;Help with stairs or ramp for entrance;Assistance with cooking/housework    Equipment Recommendations Rolling walker (2 wheels) (RW prn)  Recommendations for Other Services       Functional Status Assessment Patient has not had a recent decline in their functional status     Precautions / Restrictions Precautions Precautions: Fall;Back Precaution Booklet Issued: Yes (comment) Required Braces or Orthoses: Spinal Brace Spinal Brace: Lumbar corset;Applied in sitting position Restrictions Weight Bearing Restrictions: No      Mobility  Bed Mobility               General bed mobility comments: Pt up in chair upon entry    Transfers Overall transfer level: Modified  independent Equipment used: None, Rolling walker (2 wheels)               General transfer comment: Pt able to stand with and without RW.    Ambulation/Gait Ambulation/Gait assistance: Modified independent (Device/Increase time), Independent Gait Distance (Feet): 350 Feet Assistive device: Rolling walker (2 wheels), None Gait Pattern/deviations: Step-through pattern Gait velocity: Decreased Gait velocity interpretation: 1.31 - 2.62 ft/sec, indicative of limited community ambulator   General Gait Details: Pt ambulated 250 feet with RW and 100 feet without assistive device. No LOB occurred. Pt with slightly antalgic gait.  Stairs            Wheelchair Mobility    Modified Rankin (Stroke Patients Only)       Balance Overall balance assessment: Independent, Modified Independent (Pt able to hold FTEO and semi-tandem with minimal unsteadiness and CGA for safety)                                           Pertinent Vitals/Pain Pain Assessment Pain Assessment: 0-10 Pain Score: 6  Pain Location: low back Pain Descriptors / Indicators: Dull Pain Intervention(s): Premedicated before session, Limited activity within patient's tolerance, Monitored during session    Home Living Family/patient expects to be discharged to:: Private residence Living Arrangements: Spouse/significant other Available Help at Discharge: Family;Available 24 hours/day Type of Home: House Home Access: Stairs to enter   [employer] of Steps: 1   Home Layout: One level Home Equipment: Toilet riser;BSC/3in1;Cane - single point      Prior Function Prior Level of Function : Independent/Modified Independent;Driving  Mobility Comments: indep ADLs Comments: independent     Hand Dominance   Dominant Hand: Right    Extremity/Trunk Assessment   Upper Extremity Assessment Upper Extremity Assessment: Overall WFL for tasks assessed    Lower Extremity  Assessment Lower Extremity Assessment:  (Pt able to perform antigravity movements with major muscle groups (no formal MMT 2/2 postop))    Cervical / Trunk Assessment Cervical / Trunk Assessment: Back Surgery  Communication   Communication: Other (comment) (some mumbled speech (hx of CVA))  Cognition Arousal/Alertness: Awake/alert Behavior During Therapy: WFL for tasks assessed/performed Overall Cognitive Status: Within Functional Limits for tasks assessed                                 General Comments: Pt pleasant and cooperative.        General Comments General comments (skin integrity, edema, etc.): Wife present and supportive    Exercises Other Exercises Other Exercises: Pt instructed on how to perform standing hip flexion, abduction, and extension with proper sets/reps while avoiding unwanted compensations (HHA on stable surface). Pt performed several reps of mini squats from chair using B UE use- some unsteadiness present and pt educated on progressing to this rather than performing right away on his own. Pt also performed several reps of abdominal bracing. Pt educated on cardiovascular fitness including walking.   Assessment/Plan    PT Assessment Patient does not need any further PT services  PT Problem List Pain       PT Treatment Interventions      PT Goals (Current goals can be found in the Care Plan section)  Acute Rehab PT Goals Patient Stated Goal: n/a    Frequency       Co-evaluation               AM-PAC PT "6 Clicks" Mobility  Outcome Measure Help needed turning from your back to your side while in a flat bed without using bedrails?: None Help needed moving from lying on your back to sitting on the side of a flat bed without using bedrails?: None Help needed moving to and from a bed to a chair (including a wheelchair)?: None Help needed standing up from a chair using your arms (e.g., wheelchair or bedside chair)?: None Help needed  to walk in hospital room?: None Help needed climbing 3-5 steps with a railing? : A Little 6 Click Score: 23    End of Session Equipment Utilized During Treatment: Back brace Activity Tolerance: Patient tolerated treatment well Patient left: in chair;with nursing/sitter in room;with family/visitor present Nurse Communication: Mobility status PT Visit Diagnosis: Pain Pain - part of body:  (lumbar)    Time: 1130-1155 PT Time Calculation (min) (ACUTE ONLY): 25 min   Charges:   PT Evaluation $PT Eval Low Complexity: 1 Low          Donna Bernard, PT   [facility] 08/04/2022, 12:09 PM

## 2022-08-05 ENCOUNTER — Emergency Department (HOSPITAL_COMMUNITY)
Admission: EM | Admit: 2022-08-05 | Discharge: 2022-08-05 | Disposition: A | Discharge status: Home / Self Care | Payer: Medicare Other | Attending: Emergency Medicine | Admitting: Emergency Medicine

## 2022-08-05 ENCOUNTER — Emergency Department (HOSPITAL_COMMUNITY): Payer: Medicare Other

## 2022-08-05 ENCOUNTER — Encounter (HOSPITAL_COMMUNITY): Payer: Self-pay | Admitting: Emergency Medicine

## 2022-08-05 ENCOUNTER — Ambulatory Visit
Admission: EM | Admit: 2022-08-05 | Discharge: 2022-08-05 | Discharge status: Against Medical Advice | Payer: Medicare Other

## 2022-08-05 DIAGNOSIS — R1084 Generalized abdominal pain: Secondary | ICD-10-CM | POA: Diagnosis present

## 2022-08-05 DIAGNOSIS — Z7982 Long term (current) use of aspirin: Secondary | ICD-10-CM | POA: Diagnosis not present

## 2022-08-05 DIAGNOSIS — J181 Lobar pneumonia, unspecified organism: Secondary | ICD-10-CM | POA: Insufficient documentation

## 2022-08-05 DIAGNOSIS — Z79899 Other long term (current) drug therapy: Secondary | ICD-10-CM | POA: Insufficient documentation

## 2022-08-05 DIAGNOSIS — N281 Cyst of kidney, acquired: Secondary | ICD-10-CM | POA: Diagnosis not present

## 2022-08-05 DIAGNOSIS — K5730 Diverticulosis of large intestine without perforation or abscess without bleeding: Secondary | ICD-10-CM | POA: Diagnosis not present

## 2022-08-05 DIAGNOSIS — T40605A Adverse effect of unspecified narcotics, initial encounter: Secondary | ICD-10-CM | POA: Diagnosis not present

## 2022-08-05 DIAGNOSIS — I700 Atherosclerosis of aorta: Secondary | ICD-10-CM | POA: Diagnosis not present

## 2022-08-05 DIAGNOSIS — J189 Pneumonia, unspecified organism: Secondary | ICD-10-CM

## 2022-08-05 DIAGNOSIS — K5903 Drug induced constipation: Secondary | ICD-10-CM | POA: Diagnosis not present

## 2022-08-05 LAB — URINALYSIS, ROUTINE W REFLEX MICROSCOPIC
Bilirubin Urine: NEGATIVE
Glucose, UA: NEGATIVE mg/dL
Hgb urine dipstick: NEGATIVE
Ketones, ur: NEGATIVE mg/dL
Leukocytes,Ua: NEGATIVE
Nitrite: NEGATIVE
Protein, ur: NEGATIVE mg/dL
Specific Gravity, Urine: 1.023 (ref 1.005–1.030)
pH: 5 (ref 5.0–8.0)

## 2022-08-05 LAB — CBC
HCT: 38.2 % — ABNORMAL LOW (ref 39.0–52.0)
Hemoglobin: 13 g/dL (ref 13.0–17.0)
MCH: 31.1 pg (ref 26.0–34.0)
MCHC: 34 g/dL (ref 30.0–36.0)
MCV: 91.4 fL (ref 80.0–100.0)
Platelets: 167 10*3/uL (ref 150–400)
RBC: 4.18 MIL/uL — ABNORMAL LOW (ref 4.22–5.81)
RDW: 12.8 % (ref 11.5–15.5)
WBC: 10.5 10*3/uL (ref 4.0–10.5)
nRBC: 0 % (ref 0.0–0.2)

## 2022-08-05 LAB — COMPREHENSIVE METABOLIC PANEL
ALT: 23 U/L (ref 0–44)
AST: 32 U/L (ref 15–41)
Albumin: 3.5 g/dL (ref 3.5–5.0)
Alkaline Phosphatase: 44 U/L (ref 38–126)
Anion gap: 9 (ref 5–15)
BUN: 10 mg/dL (ref 8–23)
CO2: 25 mmol/L (ref 22–32)
Calcium: 8.4 mg/dL — ABNORMAL LOW (ref 8.9–10.3)
Chloride: 104 mmol/L (ref 98–111)
Creatinine, Ser: 0.84 mg/dL (ref 0.61–1.24)
GFR, Estimated: >60 mL/min (ref >60)
Glucose, Bld: 111 mg/dL — ABNORMAL HIGH (ref 70–99)
Potassium: 3.7 mmol/L (ref 3.5–5.1)
Sodium: 138 mmol/L (ref 135–145)
Total Bilirubin: 1.1 mg/dL (ref 0.3–1.2)
Total Protein: 6.7 g/dL (ref 6.5–8.1)

## 2022-08-05 MED ORDER — GLYCERIN (LAXATIVE) 2 G RE SUPP
1.0000 | Freq: Once | RECTAL | Status: AC
  Administered 2022-08-05: 1 via RECTAL
  Filled 2022-08-05: qty 1

## 2022-08-05 MED ORDER — GLYCERIN (ADULT) 2 G RE SUPP: 1.0000 | RECTAL | 0 refills | Status: DC | PRN

## 2022-08-05 MED ORDER — FENTANYL CITRATE PF 50 MCG/ML IJ SOSY
50.0000 ug | PREFILLED_SYRINGE | Freq: Once | INTRAMUSCULAR | Status: AC
  Administered 2022-08-05: 50 ug via INTRAVENOUS
  Filled 2022-08-05: qty 1

## 2022-08-05 MED ORDER — IOHEXOL 300 MG/ML  SOLN
100.0000 mL | Freq: Once | INTRAMUSCULAR | Status: AC | PRN
Start: 2022-08-05 — End: 2022-08-05
  Administered 2022-08-05: 100 mL via INTRAVENOUS

## 2022-08-05 MED ORDER — POLYETHYLENE GLYCOL 3350 17 GM/SCOOP PO POWD: 1.0000 | Freq: Once | ORAL | 0 refills | Status: AC

## 2022-08-05 MED ORDER — FLEET ENEMA 7-19 GM/118ML RE ENEM
1.0000 | ENEMA | Freq: Once | RECTAL | Status: AC
  Administered 2022-08-05: 1 via RECTAL
  Filled 2022-08-05: qty 1

## 2022-08-05 MED ORDER — DOXYCYCLINE HYCLATE 100 MG PO CAPS
100.0000 mg | ORAL_CAPSULE | Freq: Two times a day (BID) | ORAL | 0 refills | Status: AC
Start: 2022-08-05 — End: 2022-08-10

## 2022-08-05 MED ORDER — AMOXICILLIN-POT CLAVULANATE 875-125 MG PO TABS
1.0000 | ORAL_TABLET | Freq: Two times a day (BID) | ORAL | 0 refills | Status: AC
Start: 2022-08-05 — End: 2022-08-10

## 2022-08-05 NOTE — ED Notes (Signed)
Patient given discharge instructions, all questions answered. Patient in possession of all belongings, directed to the discharge area  

## 2022-08-05 NOTE — Discharge Instructions (Addendum)
For constipation, recommend a combination of glycerin suppositories, at home enemas, MiraLAX cleanout.  Recommend 4 capfuls of MiraLAX in a 32 ounce Gatorade, drink over 4 hours, consume additional liquids as well.  Was no evidence of a bowel obstruction on your CT scan.  The findings of possible developing pneumonia and your shortness of breath and cough, recommend you start a course of outpatient antibiotics, return to the emergency department for any worsening symptoms.  Your CT results: IMPRESSION:  1. Left lower lobe consolidation, possible atelectasis versus  pneumonia.  2. Moderate amount of retained stool in the colon suggesting  constipation.  3. Diverticulosis without diverticulitis.  4. Aortic atherosclerosis and coronary artery calcifications.      Electronically Signed

## 2022-08-05 NOTE — ED Triage Notes (Signed)
Patient arrives by POV stating he just had surgery on L4-L5 on Friday. Last night started having swelling to feet and abdomen. No BM since Thursday and is having discomfort.

## 2022-08-05 NOTE — ED Provider Notes (Signed)
[facility] EMERGENCY DEPARTMENT Provider Note   CSN: [id number] Arrival date & time: 08/05/22  1548     History  Chief Complaint  Patient presents with   Post-op Problem   Constipation    Gary Frederick is a 66 y.o. male.   Constipation    66 year old male who presents to the emergency department 2 days postop from an L4 laminectomy with facetectomy for decompression of nerve roots, placement of Medtronic expandable cages, placement of pedicle screws L4-L5 with interbody arthrodesis at L4-L5 due to lumbar spondylosis at L4-L5 with spinal stenosis and neurogenic claudication with Dr. Kathyrn Sheriff of neurosurgery who presents to the emergency department with a postop complaint.  The patient states that he has not had a bowel movement since surgery.  He states that he thinks he is only passed gas one time.  He endorses generalized abdominal discomfort.  He also has been taking opiates for pain control outpatient.  He denies any fevers or chills, nausea or vomiting.  He noticed postoperatively that he had swelling in his feet and abdomen.  He denies any chest pain or shortness of breath.  Home Medications Prior to Admission medications   Medication Sig Start Date End Date Taking? Authorizing Provider  amoxicillin-clavulanate (AUGMENTIN) 875-125 MG tablet Take 1 tablet by mouth every 12 (twelve) hours for 5 days. 08/05/22 08/10/22 Yes Regan Lemming, MD  doxycycline (VIBRAMYCIN) 100 MG capsule Take 1 capsule (100 mg total) by mouth 2 (two) times daily for 5 days. 08/05/22 08/10/22 Yes Regan Lemming, MD  glycerin adult 2 g suppository Place 1 suppository rectally as needed for constipation. 08/05/22  Yes Regan Lemming, MD  acetaminophen (TYLENOL) 500 MG tablet Take 500 mg by mouth every 6 (six) hours as needed.    [provider]  allopurinol (ZYLOPRIM) 100 MG tablet Take 100 mg by mouth daily. 09/29/21   [provider]  amLODipine (NORVASC) 10 MG tablet Take  1 tablet (10 mg total) by mouth daily with lunch. 03/18/22   Eugenie Filler, MD  apixaban (ELIQUIS) 5 MG TABS tablet Take 1 tablet (5 mg total) by mouth 2 (two) times daily. **Restart on 08/08/2022** 08/04/22   Viona Gilmore D, NP  atorvastatin (LIPITOR) 40 MG tablet Take 1 tablet (40 mg total) by mouth daily. 03/18/22   Eugenie Filler, MD  bisacodyl (DULCOLAX) 5 MG EC tablet Take 10 mg by mouth daily as needed for moderate constipation.    [provider]  diclofenac Sodium (VOLTAREN) 1 % GEL Apply 1 Application topically 4 (four) times daily as needed (pain).    [provider]  docusate sodium (COLACE) 100 MG capsule Take 1 capsule (100 mg total) by mouth 2 (two) times daily. 08/04/22   Viona Gilmore D, NP  esomeprazole (NEXIUM) 20 MG capsule Take 20 mg by mouth daily as needed (acid reflux).    [provider]  fluticasone (FLONASE) 50 MCG/ACT nasal spray Place 2 sprays into both nostrils daily as needed for allergies. 03/28/22   [provider]  methocarbamol (ROBAXIN) 500 MG tablet Take 1 tablet (500 mg total) by mouth every 6 (six) hours as needed for muscle spasms. 08/04/22   Viona Gilmore D, NP  metoprolol succinate (TOPROL-XL) 50 MG 24 hr tablet Take 50 mg by mouth daily.  06/08/13   [provider]  nicotine (NICODERM CQ - DOSED IN MG/24 HR) 7 mg/24hr patch Place 1 patch (7 mg total) onto the skin daily. Patient taking differently: Place  7 mg onto the skin daily as needed (nicotine dependence). 07/05/22   Patwardhan, Reynold Bowen, MD  omega-3 acid ethyl esters (LOVAZA) 1 G capsule Take 2 g by mouth daily.  05/28/13   [provider]  ONE TOUCH ULTRA TEST test strip USE 1 STRIP TO Delia DAILY 12/31/18   [provider]  oxyCODONE-acetaminophen (PERCOCET) 5-325 MG tablet Take 1-2 tablets by mouth every 4 (four) hours as needed for severe pain. 08/04/22 08/04/23  Viona Gilmore D, NP  OZEMPIC, 0.25 OR 0.5 MG/DOSE, 2 MG/1.5ML  SOPN Inject 0.5 mg into the skin every Thursday. 09/22/21   [provider]  Polyvinyl Alcohol-Povidone (REFRESH OP) Place 1 drop into both eyes daily.    [provider]      Allergies    Patient has no known allergies.    Review of Systems   Review of Systems  Gastrointestinal:  Positive for constipation.  All other systems reviewed and are negative.   Physical Exam Updated Vital Signs BP 120/81   Pulse 89   Temp 98.7 F (37.1 C) (Oral)   Resp 18   Ht '6\' 3"'$  (1.905 m)   Wt 112 kg   SpO2 92%   BMI 30.87 kg/m  Physical Exam Vitals and nursing note reviewed. Exam conducted with a chaperone present.  Constitutional:      General: He is not in acute distress.    Appearance: He is well-developed.  HENT:     Head: Normocephalic and atraumatic.  Eyes:     Conjunctiva/sclera: Conjunctivae normal.  Cardiovascular:     Rate and Rhythm: Normal rate and regular rhythm.  Pulmonary:     Effort: Pulmonary effort is normal. No respiratory distress.     Breath sounds: Normal breath sounds.  Abdominal:     General: There is distension.     Palpations: Abdomen is soft.     Tenderness: There is abdominal tenderness.     Comments: Mild generalized abdominal tenderness and distention  Genitourinary:    Comments: No fecal impaction. Musculoskeletal:        General: No swelling.     Cervical back: Neck supple.  Skin:    General: Skin is warm and dry.     Capillary Refill: Capillary refill takes less than 2 seconds.  Neurological:     Mental Status: He is alert.  Psychiatric:        Mood and Affect: Mood normal.     ED Results / Procedures / Treatments   Labs (all labs ordered are listed, but only abnormal results are displayed) Labs Reviewed  COMPREHENSIVE METABOLIC PANEL - Abnormal; Notable for the following components:      Result Value   Glucose, Bld 111 (*)    Calcium 8.4 (*)    All other components within normal limits  CBC - Abnormal; Notable for  the following components:   RBC 4.18 (*)    HCT 38.2 (*)    All other components within normal limits  URINALYSIS, ROUTINE W REFLEX MICROSCOPIC    EKG None  Radiology CT ABDOMEN PELVIS W CONTRAST  Result Date: 08/05/2022 CLINICAL DATA:  Swelling and feet in abdomen, discomfort. Bowel obstruction suspected. Recent surgery at L4-L5. EXAM: CT ABDOMEN AND PELVIS WITH CONTRAST TECHNIQUE: Multidetector CT imaging of the abdomen and pelvis was performed using the standard protocol following bolus administration of intravenous contrast. RADIATION DOSE REDUCTION: This exam was performed according to the departmental dose-optimization program which includes automated exposure control, adjustment  of the mA and/or kV according to patient size and/or use of iterative reconstruction technique. CONTRAST:  114m OMNIPAQUE IOHEXOL 300 MG/ML  SOLN COMPARISON:  04/16/2013. FINDINGS: Lower chest: Multi-vessel coronary artery calcifications are noted. There is consolidation in the left lower lobe. Mild atelectasis at the right lung base. Hepatobiliary: Mild focal fatty infiltration along in the left lobe of the liver. No biliary ductal dilatation. The gallbladder is without stones. Pancreas: Unremarkable. No pancreatic ductal dilatation or surrounding inflammatory changes. Spleen: Normal in size without focal abnormality. Adrenals/Urinary Tract: No adrenal nodule or mass. The kidneys enhance symmetrically. A cyst is present in the upper pole of the right kidney. No renal calculus or hydronephrosis. The bladder is unremarkable. Stomach/Bowel: Stomach is within normal limits. Appendix appears normal. No evidence of bowel wall thickening, distention, or inflammatory changes. No free air or pneumatosis. A moderate amount of retained stool is present in the colon. Right hemicolectomy changes are noted. Vascular/Lymphatic: Aortic atherosclerosis. No enlarged abdominal or pelvic lymph nodes. Reproductive: Prostate is unremarkable.  Other: No abdominopelvic ascites. A fat containing umbilical hernia is noted. Rim calcified densities are noted in the left upper quadrant quadrant measuring 2.8 cm and in the left lower quadrant measuring 1.1 cm, likely fat necrosis. Musculoskeletal: Degenerative changes are present in the thoracolumbar spine. Spinal fusion hardware is present at L4-L5. No acute osseous abnormality. IMPRESSION: 1. Left lower lobe consolidation, possible atelectasis versus pneumonia. 2. Moderate amount of retained stool in the colon suggesting constipation. 3. Diverticulosis without diverticulitis. 4. Aortic atherosclerosis and coronary artery calcifications. Electronically Signed   By: LBrett FairyM.D.   On: 08/05/2022 20:47    Procedures Procedures    Medications Ordered in ED Medications  Glycerin (Adult) 2 g suppository 1 suppository (1 suppository Rectal Given 08/05/22 1837)  fentaNYL (SUBLIMAZE) injection 50 mcg (50 mcg Intravenous Given 08/05/22 2002)  iohexol (OMNIPAQUE) 300 MG/ML solution 100 mL (100 mLs Intravenous Contrast Given 08/05/22 2026)  sodium phosphate (FLEET) 7-19 GM/118ML enema 1 enema (1 enema Rectal Given 08/05/22 2205)    ED Course/ Medical Decision Making/ A&P                           Medical Decision Making Amount and/or Complexity of Data Reviewed Labs: ordered. Radiology: ordered.  Risk OTC drugs. Prescription drug management.    66year old male who presents to the emergency department 2 days postop from an L4 laminectomy with facetectomy for decompression of nerve roots, placement of Medtronic expandable cages, placement of pedicle screws L4-L5 with interbody arthrodesis at L4-L5 due to lumbar spondylosis at L4-L5 with spinal stenosis and neurogenic claudication with Dr. NKathyrn Sheriffof neurosurgery who presents to the emergency department with a postop complaint.  The patient states that he has not had a bowel movement since surgery.  He states that he thinks he is only passed gas  one time.  He endorses generalized abdominal discomfort.  He also has been taking opiates for pain control outpatient.  He denies any fevers or chills, nausea or vomiting.  He noticed postoperatively that he had swelling in his feet and abdomen.  He denies any chest pain or shortness of breath.  On arrival, the patient was vitally stable.  Sinus rhythm noted on cardiac telemetry.  Physical exam significant for mild generalized abdominal distention and tenderness to palpation with rectal exam with no fecal impaction.  Suspect likely postop to the patient in the setting of postoperative state and opiate use.  Additionally considered small bowel obstruction although feel less likely but remains on the differential as the patient states he is not passing gas.  CT abdomen pelvis ordered to further evaluate.  The patient has no fecal impaction on rectal exam.  Laboratory evaluation generally unremarkable with a normal urinalysis, CBC without a leukocytosis or anemia, CMP generally unremarkable.  CT abdomen pelvis revealed the following: IMPRESSION:  1. Left lower lobe consolidation, possible atelectasis versus  pneumonia.  2. Moderate amount of retained stool in the colon suggesting  constipation.  3. Diverticulosis without diverticulitis.  4. Aortic atherosclerosis and coronary artery calcifications.    Tried a glycerin suppository and fleets enema.  Patient states that he would feel more comfortable attempting these measures at home to have a successful bowel movement.  He is currently on postop day 2 without a bowel movement.  Overall well-appearing, not vomiting, tolerating oral intake.  Advised the patient to continue a bowel regimen at home to include MiraLAX cleanout, glycerin suppositories, enema at home as needed.   Discussed the current CT findings of pneumonia, patient does endorse a mildly productive cough over the past few days.  Will treat with oral antibiotics.   Follow-up with his  surgeon for continued postoperative care, return precautions to the emergency department provided.  Final Clinical Impression(s) / ED Diagnoses Final diagnoses:  Drug-induced constipation  Community acquired pneumonia of right lower lobe of lung    Rx / DC Orders ED Discharge Orders          Ordered    amoxicillin-clavulanate (AUGMENTIN) 875-125 MG tablet  Every 12 hours        08/05/22 2240    doxycycline (VIBRAMYCIN) 100 MG capsule  2 times daily        08/05/22 2240    glycerin adult 2 g suppository  As needed        08/05/22 2241    polyethylene glycol powder (GLYCOLAX/MIRALAX) 17 GM/SCOOP powder   Once        08/05/22 2241              Regan Lemming, MD 08/06/22 [phone]

## 2022-08-05 NOTE — ED Notes (Signed)
Patient transported to CT 

## 2022-08-07 DIAGNOSIS — R531 Weakness: Secondary | ICD-10-CM | POA: Diagnosis not present

## 2022-08-09 ENCOUNTER — Encounter (HOSPITAL_COMMUNITY): Payer: Self-pay | Admitting: Neurosurgery

## 2022-08-20 ENCOUNTER — Other Ambulatory Visit (HOSPITAL_COMMUNITY): Payer: Self-pay | Admitting: Neurosurgery

## 2022-08-20 DIAGNOSIS — M7989 Other specified soft tissue disorders: Secondary | ICD-10-CM

## 2022-08-21 ENCOUNTER — Ambulatory Visit (HOSPITAL_COMMUNITY)
Admission: RE | Admit: 2022-08-21 | Discharge: 2022-08-21 | Disposition: A | Discharge status: Home / Self Care | Payer: Medicare Other | Source: Ambulatory Visit | Attending: Neurosurgery | Admitting: Neurosurgery

## 2022-08-21 DIAGNOSIS — M7989 Other specified soft tissue disorders: Secondary | ICD-10-CM

## 2022-08-21 NOTE — Progress Notes (Signed)
Lower extremity venous duplex has been completed.   Preliminary results in CV Proc.   Reed Dady Jennyfer Nickolson 08/21/2022 11:11 AM

## 2022-08-22 ENCOUNTER — Encounter (HOSPITAL_COMMUNITY): Payer: Self-pay | Admitting: Neurosurgery

## 2022-08-27 DIAGNOSIS — M47816 Spondylosis without myelopathy or radiculopathy, lumbar region: Secondary | ICD-10-CM | POA: Diagnosis not present

## 2022-08-28 ENCOUNTER — Ambulatory Visit (HOSPITAL_COMMUNITY): Payer: Medicare Other

## 2022-08-28 DIAGNOSIS — E1169 Type 2 diabetes mellitus with other specified complication: Secondary | ICD-10-CM | POA: Diagnosis not present

## 2022-08-28 DIAGNOSIS — E782 Mixed hyperlipidemia: Secondary | ICD-10-CM | POA: Diagnosis not present

## 2022-08-28 DIAGNOSIS — I10 Essential (primary) hypertension: Secondary | ICD-10-CM | POA: Diagnosis not present

## 2022-08-28 DIAGNOSIS — M5459 Other low back pain: Secondary | ICD-10-CM | POA: Diagnosis not present

## 2022-09-10 ENCOUNTER — Encounter: Payer: Self-pay | Admitting: Licensed Clinical Social Worker

## 2022-09-10 NOTE — Progress Notes (Signed)
[facility] Healthcare Advance Directives Clinical Social Work  Patient presented with healthcare advance directives for notarization.  Clinical Social Worker met with patient and spouse.  The patient designated Sheryl K Hofferber as their primary healthcare agent and did not assign a secondary agent.  Patient also completed healthcare living will.    Documents were notarized and copies made for patient/family. Clinical Social Worker will send documents to medical records to be scanned into patient's chart. Clinical Social Worker encouraged patient/family to contact with any additional questions or concerns.   MICHELLE E ZAVALA, LCSW Clinical Social Worker [facility]        

## 2022-09-11 DIAGNOSIS — I480 Paroxysmal atrial fibrillation: Secondary | ICD-10-CM | POA: Diagnosis not present

## 2022-09-11 DIAGNOSIS — E1169 Type 2 diabetes mellitus with other specified complication: Secondary | ICD-10-CM | POA: Diagnosis not present

## 2022-09-11 DIAGNOSIS — Z720 Tobacco use: Secondary | ICD-10-CM | POA: Diagnosis not present

## 2022-09-11 DIAGNOSIS — E785 Hyperlipidemia, unspecified: Secondary | ICD-10-CM | POA: Diagnosis not present

## 2022-09-11 DIAGNOSIS — I10 Essential (primary) hypertension: Secondary | ICD-10-CM | POA: Diagnosis not present

## 2022-09-11 DIAGNOSIS — I63311 Cerebral infarction due to thrombosis of right middle cerebral artery: Secondary | ICD-10-CM | POA: Diagnosis not present

## 2022-09-11 DIAGNOSIS — R350 Frequency of micturition: Secondary | ICD-10-CM | POA: Diagnosis not present

## 2022-09-17 DIAGNOSIS — H1045 Other chronic allergic conjunctivitis: Secondary | ICD-10-CM | POA: Diagnosis not present

## 2022-09-17 DIAGNOSIS — H04123 Dry eye syndrome of bilateral lacrimal glands: Secondary | ICD-10-CM | POA: Diagnosis not present

## 2022-09-17 DIAGNOSIS — I639 Cerebral infarction, unspecified: Secondary | ICD-10-CM | POA: Diagnosis not present

## 2022-09-17 DIAGNOSIS — H2513 Age-related nuclear cataract, bilateral: Secondary | ICD-10-CM | POA: Diagnosis not present

## 2022-09-17 DIAGNOSIS — E119 Type 2 diabetes mellitus without complications: Secondary | ICD-10-CM | POA: Diagnosis not present

## 2022-09-26 ENCOUNTER — Ambulatory Visit
Admission: RE | Admit: 2022-09-26 | Discharge: 2022-09-26 | Disposition: A | Discharge status: Home / Self Care | Payer: Medicare Other | Source: Ambulatory Visit | Attending: Family Medicine | Admitting: Family Medicine

## 2022-09-26 DIAGNOSIS — J439 Emphysema, unspecified: Secondary | ICD-10-CM | POA: Diagnosis not present

## 2022-09-26 DIAGNOSIS — F17210 Nicotine dependence, cigarettes, uncomplicated: Secondary | ICD-10-CM

## 2022-09-26 DIAGNOSIS — I898 Other specified noninfective disorders of lymphatic vessels and lymph nodes: Secondary | ICD-10-CM | POA: Diagnosis not present

## 2022-09-26 DIAGNOSIS — Z87891 Personal history of nicotine dependence: Secondary | ICD-10-CM | POA: Diagnosis not present

## 2022-09-26 DIAGNOSIS — I2510 Atherosclerotic heart disease of native coronary artery without angina pectoris: Secondary | ICD-10-CM | POA: Diagnosis not present

## 2022-10-01 ENCOUNTER — Other Ambulatory Visit: Payer: Self-pay | Admitting: Acute Care

## 2022-10-01 DIAGNOSIS — Z87891 Personal history of nicotine dependence: Secondary | ICD-10-CM

## 2022-10-01 DIAGNOSIS — Z122 Encounter for screening for malignant neoplasm of respiratory organs: Secondary | ICD-10-CM

## 2022-10-01 DIAGNOSIS — F17210 Nicotine dependence, cigarettes, uncomplicated: Secondary | ICD-10-CM

## 2022-10-05 ENCOUNTER — Ambulatory Visit: Payer: No Typology Code available for payment source | Admitting: Cardiology

## 2022-10-05 ENCOUNTER — Encounter: Payer: Self-pay | Admitting: Cardiology

## 2022-10-05 VITALS — BP 123/89 | HR 77 | Resp 16 | Ht 75.0 in | Wt 250.0 lb

## 2022-10-05 DIAGNOSIS — F17210 Nicotine dependence, cigarettes, uncomplicated: Secondary | ICD-10-CM

## 2022-10-05 DIAGNOSIS — I480 Paroxysmal atrial fibrillation: Secondary | ICD-10-CM | POA: Diagnosis not present

## 2022-10-05 DIAGNOSIS — E782 Mixed hyperlipidemia: Secondary | ICD-10-CM | POA: Diagnosis not present

## 2022-10-05 NOTE — Progress Notes (Signed)
Follow up visit  Subjective:   Gary Frederick, male    DOB: [birth year], 66 y.o.   MRN: [id number]   HPI  Chief Complaint  Patient presents with   Atrial Fibrillation   Follow-up    14 month    66 year old African-American male with hypertension, hyperlipidemia, type 2 diabetes mellitus, h/o stroke with minimal residual neurodeficit, PAF, smoker, h/o alcohol abuse, cannabis use, gout, colon cancer in remission  Patient underwent spine surgery with improvement in nerological claudication symptoms. She denies chest pain, shortness of breath, palpitations, leg edema, orthopnea, PND, TIA/syncope. Reviewed recent test results with the patient, details below.      Current Outpatient Medications:    acetaminophen (TYLENOL) 500 MG tablet, Take 500 mg by mouth every 6 (six) hours as needed., Disp: , Rfl:    allopurinol (ZYLOPRIM) 100 MG tablet, Take 100 mg by mouth daily., Disp: , Rfl:    amLODipine (NORVASC) 10 MG tablet, Take 1 tablet (10 mg total) by mouth daily with lunch., Disp: , Rfl:    apixaban (ELIQUIS) 5 MG TABS tablet, Take 1 tablet (5 mg total) by mouth 2 (two) times daily. **Restart on 08/08/2022**, Disp: 30 tablet, Rfl: 0   atorvastatin (LIPITOR) 40 MG tablet, Take 1 tablet (40 mg total) by mouth daily., Disp: 30 tablet, Rfl: 2   bisacodyl (DULCOLAX) 5 MG EC tablet, Take 10 mg by mouth daily as needed for moderate constipation., Disp: , Rfl:    diclofenac Sodium (VOLTAREN) 1 % GEL, Apply 1 Application topically 4 (four) times daily as needed (pain)., Disp: , Rfl:    docusate sodium (COLACE) 100 MG capsule, Take 1 capsule (100 mg total) by mouth 2 (two) times daily., Disp: 10 capsule, Rfl: 0   esomeprazole (NEXIUM) 20 MG capsule, Take 20 mg by mouth daily as needed (acid reflux)., Disp: , Rfl:    fluticasone (FLONASE) 50 MCG/ACT nasal spray, Place 2 sprays into both nostrils daily as needed for allergies., Disp: , Rfl:    metoprolol succinate (TOPROL-XL) 50 MG 24 hr tablet,  Take 50 mg by mouth daily. , Disp: , Rfl:    nicotine (NICODERM CQ - DOSED IN MG/24 HR) 7 mg/24hr patch, Place 1 patch (7 mg total) onto the skin daily. (Patient taking differently: Place 7 mg onto the skin daily as needed (nicotine dependence).), Disp: 30 patch, Rfl: 3   omega-3 acid ethyl esters (LOVAZA) 1 G capsule, Take 2 g by mouth daily. , Disp: , Rfl:    ONE TOUCH ULTRA TEST test strip, USE 1 STRIP TO CHECK GLUCOSE TWICE DAILY, Disp: , Rfl:    OZEMPIC, 0.25 OR 0.5 MG/DOSE, 2 MG/1.5ML SOPN, Inject 0.5 mg into the skin every Thursday., Disp: , Rfl:    Polyvinyl Alcohol-Povidone (REFRESH OP), Place 1 drop into both eyes daily., Disp: , Rfl:    Cardiovascular & other pertient studies:  Reviewed external labs and tests, independently interpreted  EKG 10/05/2022: Sinus rhythm 73 bpm  Leftward axis Nonspecific T-abnormality  Carotid artery duplex 04/20/2022:  Duplex suggests stenosis in the right internal carotid artery (minimal),  mild heterogenous plaque is noted in the proximal ICA.  Duplex suggests stenosis in the left internal carotid artery (1-15%). Mild  homogenous plaque is noted in the left ICA  Antegrade right vertebral artery flow. Antegrade left vertebral artery  flow.  Follow-up studies if clinically indicated.  Brain MRI 03/16/2022: 1. Acute infarcts in the Right MCA (6 cm, frontal operculum and superior frontal gyrus), Right PCA (  right occipital pole), and Right Cerebellar artery territories. Associated MCA and PCA Heidelberg Classification 1B petechial hemorrhage. No significant mass effect.   2. Underlying small chronic lacunar infarcts of the left globus pallidus and cerebellum (left PICA).      Exercise nuclear stress test 11/22/2021: Normal ECG stress. The patient exercised for 7 minutes and 20 seconds of a Bruce protocol, achieving approximately 8.05 METs.  The blood pressure response was normal. Myocardial perfusion is normal. Overall LV systolic function is  normal without regional wall motion abnormalities. Stress LV EF: 62%.  No previous exam available for comparison. Low risk.   Echocardiogram 11/22/2021:  Left ventricle cavity is normal in size. Moderate concentric hypertrophy  of the left ventricle. Normal global wall motion. Normal LV systolic  function with visual EF 61%. Doppler evidence of grade I (impaired)  diastolic dysfunction, normal LAP.  Structurally normal trileaflet aoratic valve. No evidence of aortic  stenosis. Mild (Grade I) aortic regurgitation.  Mild tricuspid regurgitation. Estimated pulmonary artery systolic pressure  30 mmHg.     Recent labs: 08/05/2022: Glucose 111, BUN/Cr 10/0.84. EGFR >60. Na/K 138/3.7. Rest of the CMP normal H/H 13/38. MCV 91. Platelets 167 HbA1C 6.2% Chol 112, TG 108, HDL 39, LDL 53  03/17/2022: Glucose 99, BUN/Cr 7/0.88. EGFR >60. Na/K 140/3.5.  H/H 14/41. MCV 89. Platelets 194 HbA1C 6.6% Chol 181, TG 189, HDL 28, LDL 115 TSH 1.2 normal    Review of Systems  Cardiovascular:  Negative for chest pain, dyspnea on exertion, leg swelling, palpitations and syncope.  Musculoskeletal:  Negative for back pain.         Vitals:   10/05/22 1117  BP: 123/89  Pulse: 77  Resp: 16  SpO2: 97%     Body mass index is 31.25 kg/m. Filed Weights   10/05/22 1117  Weight: 250 lb (113.4 kg)     Objective:   Physical Exam Vitals and nursing note reviewed.  Constitutional:      General: He is not in acute distress. Neck:     Vascular: No JVD.  Cardiovascular:     Rate and Rhythm: Normal rate and regular rhythm.     Heart sounds: Normal heart sounds. No murmur heard. Pulmonary:     Effort: Pulmonary effort is normal.     Breath sounds: Normal breath sounds. No wheezing or rales.  Musculoskeletal:     Right lower leg: No edema.     Left lower leg: No edema.             Visit diagnoses:   ICD-10-CM   1. Paroxysmal atrial fibrillation (HCC)  I48.0 EKG 12-Lead    2. Mixed  hyperlipidemia  E78.2     3. Nicotine dependence, cigarettes, uncomplicated  C58.527        Orders Placed This Encounter  Procedures   EKG 12-Lead     Assessment & Recommendations:   66 year old African-American male with hypertension, hyperlipidemia, type 2 diabetes mellitus, h/o stroke with minimal residual neurodeficit, PAF, smoker, h/o alcohol abuse, cannabis use, gout, colon cancer in remission  PAF: H/o stroke. CHA2DS2VASc score 3, annual stroke risj 3.2%. Continue Eliquis 5 mg twice daily  Mixed hyperlipidemia: LDL down to 53. Conintue Lipitor 40 mg daily.   Nicotine dependence: Down to <1/2 pack/day Continue nicotine patch  F/u in 6 months    Nigel Mormon, MD Pager: [phone] Office: [phone]

## 2022-10-23 ENCOUNTER — Other Ambulatory Visit (HOSPITAL_COMMUNITY): Payer: Self-pay

## 2022-10-23 MED ORDER — OZEMPIC (0.25 OR 0.5 MG/DOSE) 2 MG/3ML ~~LOC~~ SOPN: 0.5000 mg | PEN_INJECTOR | SUBCUTANEOUS | 5 refills | Status: DC

## 2022-10-23 MED ORDER — OZEMPIC (0.25 OR 0.5 MG/DOSE) 2 MG/3ML ~~LOC~~ SOPN
0.5000 mg | PEN_INJECTOR | SUBCUTANEOUS | 5 refills | Status: DC
  Filled 2022-10-23: qty 3, 28d supply, fill #0

## 2022-10-29 ENCOUNTER — Other Ambulatory Visit (HOSPITAL_COMMUNITY): Payer: Self-pay

## 2022-11-01 DIAGNOSIS — I10 Essential (primary) hypertension: Secondary | ICD-10-CM | POA: Diagnosis not present

## 2022-11-01 DIAGNOSIS — E7849 Other hyperlipidemia: Secondary | ICD-10-CM | POA: Diagnosis not present

## 2022-11-06 DIAGNOSIS — Z720 Tobacco use: Secondary | ICD-10-CM | POA: Diagnosis not present

## 2022-11-06 DIAGNOSIS — E785 Hyperlipidemia, unspecified: Secondary | ICD-10-CM | POA: Diagnosis not present

## 2022-11-06 DIAGNOSIS — I480 Paroxysmal atrial fibrillation: Secondary | ICD-10-CM | POA: Diagnosis not present

## 2022-11-06 DIAGNOSIS — I63311 Cerebral infarction due to thrombosis of right middle cerebral artery: Secondary | ICD-10-CM | POA: Diagnosis not present

## 2022-11-06 DIAGNOSIS — E1169 Type 2 diabetes mellitus with other specified complication: Secondary | ICD-10-CM | POA: Diagnosis not present

## 2022-11-06 DIAGNOSIS — I10 Essential (primary) hypertension: Secondary | ICD-10-CM | POA: Diagnosis not present

## 2022-12-03 DIAGNOSIS — M519 Unspecified thoracic, thoracolumbar and lumbosacral intervertebral disc disorder: Secondary | ICD-10-CM

## 2022-12-03 HISTORY — DX: Unspecified thoracic, thoracolumbar and lumbosacral intervertebral disc disorder: M51.9

## 2023-01-01 DIAGNOSIS — E7849 Other hyperlipidemia: Secondary | ICD-10-CM | POA: Diagnosis not present

## 2023-01-01 DIAGNOSIS — I10 Essential (primary) hypertension: Secondary | ICD-10-CM | POA: Diagnosis not present

## 2023-01-02 DIAGNOSIS — M48062 Spinal stenosis, lumbar region with neurogenic claudication: Secondary | ICD-10-CM | POA: Diagnosis not present

## 2023-01-15 NOTE — Therapy (Deleted)
OUTPATIENT PHYSICAL THERAPY THORACOLUMBAR EVALUATION   Patient Name: Gary Frederick MRN: [id number] DOB:[birth year], 67 y.o., male 19 Date: 01/15/2023  END OF SESSION:   Past Medical History:  Diagnosis Date   Arthritis    Cancer ([place]) 10/03/2012   colon   Constipation    Diabetes mellitus without complication ([place])    Dysrhythmia    A fib   Gout    Hyperlipidemia    Hypertension    Insomnia    Stroke ([place]) 03/15/2022   Past Surgical History:  Procedure Laterality Date   BACK SURGERY     CARPAL TUNNEL RELEASE     right   COLON RESECTION  12/11/2012   Procedure: LAPAROSCOPIC RIGHT COLON RESECTION;  Surgeon: Leighton Ruff, MD;  Location: [facility];  Service: General;  Laterality: Right;   COLON SURGERY  12/11/2012   KNEE ARTHROSCOPY     LIVER ULTRASOUND  12/11/2012   Procedure: LIVER ULTRASOUND;  Surgeon: Leighton Ruff, MD;  Location: [facility];  Service: General;  Laterality: Right;  Intraoperative Liver Ultrasound   Patient Active Problem List   Diagnosis Date Noted   Lumbar spinal stenosis 08/03/2022   Nicotine dependence, cigarettes, uncomplicated [id number]   NSVT (nonsustained ventricular tachycardia) ([place]) 03/17/2022   Dysarthria    Facial droop    Paroxysmal atrial fibrillation ([place])    Hyperlipidemia    CVA (cerebral vascular accident) ([place]) 03/15/2022   Alcohol abuse 03/15/2022   Marijuana abuse 03/15/2022   T2DM (type 2 diabetes mellitus) ([place]) 03/15/2022   ARF (acute renal failure) ([place]) 03/15/2022   Colon cancer ([place]) 10/21/2012   HYPERLIPIDEMIA 12/04/2006   LYMPHADENOPATHY 12/04/2006   CARPAL TUNNEL RELEASE, RIGHT, HX OF 12/04/2006   GOUT 11/23/2006   ALCOHOL ABUSE 11/23/2006   TOBACCO ABUSE 11/23/2006   Essential hypertension 11/23/2006    PCP: [name], MD   REFERRING PROVIDER: Consuella Lose, MD  REFERRING DIAG: [id number] (ICD-10-CM) - Spinal stenosis, lumbar region with neurogenic claudication  Rationale for Evaluation and  Treatment: Rehabilitation  THERAPY DIAG: Spinal stenosis, lumbar region with neurogenic claudication   ONSET DATE: ***  SUBJECTIVE:                                                                                                                                                                                           SUBJECTIVE STATEMENT: ***  PERTINENT HISTORY:  ***  PAIN:  Are you having pain? {OPRCPAIN:27236}  PRECAUTIONS: Other: spinal fusion L4-5  WEIGHT BEARING RESTRICTIONS: No  FALLS:  Has patient fallen in last 6 months? No  LIVING ENVIRONMENT: Lives with: {[facility] lives with:25569::"lives with their family"} Lives in: {Lives  in:25570} Stairs: {opstairs:27293} Has following equipment at home: {Assistive devices:23999}  OCCUPATION: ***  PLOF: Independent  PATIENT GOALS: ***  NEXT MD VISIT: ***  OBJECTIVE:   DIAGNOSTIC FINDINGS:  ***  PATIENT SURVEYS:  {rehab surveys:24030}  SCREENING FOR RED FLAGS: Bowel or bladder incontinence: {Yes/No:304960894} Spinal tumors: {Yes/No:304960894} Cauda equina syndrome: {Yes/No:304960894} Compression fracture: {Yes/No:304960894} Abdominal aneurysm: {Yes/No:304960894}  COGNITION: Overall cognitive status: {cognition:24006}     SENSATION: {sensation:27233}  MUSCLE LENGTH: Hamstrings: Right *** deg; Left *** deg Thomas test: Right *** deg; Left *** deg  POSTURE: {posture:25561}  PALPATION: ***  LUMBAR ROM:   AROM eval  Flexion   Extension   Right lateral flexion   Left lateral flexion   Right rotation   Left rotation    (Blank rows = not tested)  LOWER EXTREMITY ROM:     {AROM/PROM:27142}  Right eval Left eval  Hip flexion    Hip extension    Hip abduction    Hip adduction    Hip internal rotation    Hip external rotation    Knee flexion    Knee extension    Ankle dorsiflexion    Ankle plantarflexion    Ankle inversion    Ankle eversion     (Blank rows = not tested)  LOWER EXTREMITY  MMT:    MMT Right eval Left eval  Hip flexion    Hip extension    Hip abduction    Hip adduction    Hip internal rotation    Hip external rotation    Knee flexion    Knee extension    Ankle dorsiflexion    Ankle plantarflexion    Ankle inversion    Ankle eversion     (Blank rows = not tested)  LUMBAR SPECIAL TESTS:  {lumbar special test:25242}  FUNCTIONAL TESTS:  {Functional tests:24029}  GAIT: Distance walked: *** Assistive device utilized: {Assistive devices:23999} Level of assistance: {Levels of assistance:24026} Comments: ***  TODAY'S TREATMENT:                                                                                                                              DATE: ***    PATIENT EDUCATION:  Education details: *** Person educated: {Person educated:25204} Education method: {Education Method:25205} Education comprehension: {Education Comprehension:25206}  HOME EXERCISE PROGRAM: ***  ASSESSMENT:  CLINICAL IMPRESSION: Patient is a *** y.o. *** who was seen today for physical therapy evaluation and treatment for ***.   OBJECTIVE IMPAIRMENTS: {opptimpairments:25111}.   ACTIVITY LIMITATIONS: {activitylimitations:27494}  PARTICIPATION LIMITATIONS: {participationrestrictions:25113}  PERSONAL FACTORS: {Personal factors:25162} are also affecting patient's functional outcome.   REHAB POTENTIAL: {rehabpotential:25112}  CLINICAL DECISION MAKING: {clinical decision making:25114}  EVALUATION COMPLEXITY: {Evaluation complexity:25115}   GOALS: Goals reviewed with patient? {yes/no:20286}  SHORT TERM GOALS: Target date: ***  *** Baseline: Goal status: {GOALSTATUS:25110}  2.  *** Baseline:  Goal status: {GOALSTATUS:25110}  3.  *** Baseline:  Goal status: {GOALSTATUS:25110}  4.  *** Baseline:  Goal  status: {GOALSTATUS:25110}  5.  *** Baseline:  Goal status: {GOALSTATUS:25110}  6.  *** Baseline:  Goal status: {GOALSTATUS:25110}  LONG  TERM GOALS: Target date: ***  *** Baseline:  Goal status: {GOALSTATUS:25110}  2.  *** Baseline:  Goal status: {GOALSTATUS:25110}  3.  *** Baseline:  Goal status: {GOALSTATUS:25110}  4.  *** Baseline:  Goal status: {GOALSTATUS:25110}  5.  *** Baseline:  Goal status: {GOALSTATUS:25110}  6.  *** Baseline:  Goal status: {GOALSTATUS:25110}  PLAN:  PT FREQUENCY: {rehab frequency:25116}  PT DURATION: {rehab duration:25117}  PLANNED INTERVENTIONS: {rehab planned interventions:25118::"Therapeutic exercises","Therapeutic activity","Neuromuscular re-education","Balance training","Gait training","Patient/Family education","Self Care","Joint mobilization"}.  PLAN FOR NEXT SESSION: ***   Lanice Shirts, PT 01/15/2023, 12:21 PM

## 2023-01-16 ENCOUNTER — Ambulatory Visit: Payer: 59

## 2023-01-29 NOTE — Therapy (Incomplete)
OUTPATIENT PHYSICAL THERAPY THORACOLUMBAR EVALUATION   Patient Name: Gary Frederick MRN: [id number] DOB:1956-05-02, 67 y.o., male male 21 Date: 01/29/2023  END OF SESSION:   Past Medical History:  Diagnosis Date   Arthritis    Cancer ([place]) 10/03/2012   colon   Constipation    Diabetes mellitus without complication ([place])    Dysrhythmia    A fib   Gout    Hyperlipidemia    Hypertension    Insomnia    Stroke ([place]) 03/15/2022   Past Surgical History:  Procedure Laterality Date   BACK SURGERY     CARPAL TUNNEL RELEASE     right   COLON RESECTION  12/11/2012   Procedure: LAPAROSCOPIC RIGHT COLON RESECTION;  Surgeon: Leighton Ruff, MD;  [facility];  Service: General;  Laterality: Right;   COLON SURGERY  12/11/2012   KNEE ARTHROSCOPY     LIVER ULTRASOUND  12/11/2012   Procedure: LIVER ULTRASOUND;  Surgeon: Leighton Ruff, MD;  [facility];  Service: General;  Laterality: Right;  Intraoperative Liver Ultrasound   Patient Active Problem List   Diagnosis Date Noted   Lumbar spinal stenosis 08/03/2022   Nicotine dependence, cigarettes, uncomplicated [id number]   NSVT (nonsustained ventricular tachycardia) ([place]) 03/17/2022   Dysarthria    Facial droop    Paroxysmal atrial fibrillation ([place])    Hyperlipidemia    CVA (cerebral vascular accident) ([place]) 03/15/2022   Alcohol abuse 03/15/2022   Marijuana abuse 03/15/2022   T2DM (type 2 diabetes mellitus) ([place]) 03/15/2022   ARF (acute renal failure) ([place]) 03/15/2022   Colon cancer ([place]) 10/21/2012   HYPERLIPIDEMIA 12/04/2006   LYMPHADENOPATHY 12/04/2006   CARPAL TUNNEL RELEASE, RIGHT, HX OF 12/04/2006   GOUT 11/23/2006   ALCOHOL ABUSE 11/23/2006   TOBACCO ABUSE 11/23/2006   Essential hypertension 11/23/2006    PCP: [name], MD  REFERRING PROVIDER: Consuella Lose, MD  REFERRING DIAG: [phone] (ICD-10-CM) - Spinal stenosis, lumbar region with neurogenic claudication  Rationale for Evaluation and  Treatment: Rehabilitation  THERAPY DIAG:  No diagnosis found.  ONSET DATE: ***  SUBJECTIVE:                                                                                                                                                                                           SUBJECTIVE STATEMENT: ***  PERTINENT HISTORY:  HTN, CVA, Afib, DM2  PAIN:  Are you having pain: *** Location/description: *** Best-worst over past week: ***  Per eval -  - aggravating factors: *** - Easing factors: ***    PRECAUTIONS: cardiac hx  WEIGHT BEARING RESTRICTIONS: No  FALLS:  Has patient fallen in  last 6 months? {fallsyesno:27318}  LIVING ENVIRONMENT: Lives with: {[facility] lives with:25569::"lives with their family"} Lives in: {Lives in:25570} Stairs: {opstairs:27293} Has following equipment at home: {Assistive devices:23999}  OCCUPATION: ***  PLOF: {PLOF:24004}  PATIENT GOALS: ***  NEXT MD VISIT: ***  OBJECTIVE:   DIAGNOSTIC FINDINGS:  ***  PATIENT SURVEYS:  FOTO ***  SCREENING FOR RED FLAGS: Red flag questioning/screening reassuring ***   COGNITION: Overall cognitive status: Within functional limits for tasks assessed     SENSATION/NEURO: Light touch intact all extremities ***  Finger<>nose testing unremarkable No clonus Negative hoffmann and tromner sign  No ataxia with gait No aberrant eye movements or symptoms with saccades/smooth pursuits   MUSCLE LENGTH: Hamstrings: Right *** deg; Left *** deg Thomas test: Right *** deg; Left *** deg  POSTURE: {posture:25561}  PALPATION: ***  LUMBAR ROM:   AROM eval  Flexion   Extension   Right lateral flexion   Left lateral flexion   Right rotation   Left rotation    (Blank rows = not tested)  LOWER EXTREMITY ROM:     {AROM/PROM:27142}  Right eval Left eval  Hip flexion    Hip extension    Hip internal rotation    Hip external rotation    Knee extension    Knee flexion    (Blank rows = not  tested) (Key: WFL = within functional limits not formally assessed, * = concordant pain, s = stiffness/stretching sensation, NT = not tested)  Comments:    LOWER EXTREMITY MMT:    MMT Right eval Left eval  Hip flexion    Hip abduction (modified sitting)    Hip internal rotation    Hip external rotation    Knee flexion    Knee extension     (Blank rows = not tested) (Key: WFL = within functional limits not formally assessed, * = concordant pain, s = stiffness/stretching sensation, NT = not tested)  Comments:    LUMBAR SPECIAL TESTS:  {lumbar special test:25242}  FUNCTIONAL TESTS:  {Functional tests:24029}  GAIT: Distance walked: within clinic Assistive device utilized: {Assistive devices:23999} Level of assistance: {Levels of assistance:24026} Comments: ***  TODAY'S TREATMENT:                                                                                                                              [facility] Adult PT Treatment:                                                DATE: 01/30/23 Therapeutic Exercise: *** Manual Therapy: *** Neuromuscular re-ed: *** Therapeutic Activity: *** Modalities: *** Self Care: ***   PATIENT EDUCATION:  Education details: Pt education on PT impairments, prognosis, and POC. Informed consent. Rationale for interventions, safe/appropriate HEP performance Person educated: Patient Education method: Explanation, Demonstration, Tactile cues, Verbal cues, and  Handouts Education comprehension: verbalized understanding, returned demonstration, verbal cues required, tactile cues required, and needs further education    HOME EXERCISE PROGRAM: ***  ASSESSMENT:  CLINICAL IMPRESSION: Patient is a 67 y.o. gentleman who was seen today for physical therapy evaluation and treatment for low back. ***  OBJECTIVE IMPAIRMENTS: {opptimpairments:25111}.   ACTIVITY LIMITATIONS: {activitylimitations:27494}  PARTICIPATION LIMITATIONS:  {participationrestrictions:25113}  PERSONAL FACTORS: {Personal factors:25162} are also affecting patient's functional outcome.   REHAB POTENTIAL: {rehabpotential:25112}  CLINICAL DECISION MAKING: {clinical decision making:25114}  EVALUATION COMPLEXITY: {Evaluation complexity:25115}   GOALS: Goals reviewed with patient? {yes/no:20286}  SHORT TERM GOALS: Target date: ***  Pt will demonstrate appropriate understanding and performance of initially prescribed HEP in order to facilitate improved independence with management of symptoms.  Baseline: HEP provided on eval Goal status: INITIAL   2. Pt will score greater than or equal to *** on FOTO in order to demonstrate improved perception of function due to symptoms.  Baseline: ***  Goal status: INITIAL    LONG TERM GOALS: Target date: ***  Pt will score *** on FOTO in order to demonstrate improved perception of functional status due to symptoms.  Baseline: *** Goal status: INITIAL  2.  Pt will demonstrate *** lumbar AROM in order to demonstrate improved tolerance to functional movement patterns.   Baseline: *** Goal status: INITIAL  3.  Pt will demonstrate hip MMT of *** in order to demonstrate improved strength for functional movements.  Baseline: *** Goal status: INITIAL  4. Pt will perform 5xSTS in <*** sec in order to demonstrate reduced fall risk and improved functional independence. (MCID of 2.3sec)  Baseline: ***  Goal status: INITIAL   PLAN:  PT FREQUENCY: {rehab frequency:25116}  PT DURATION: {rehab duration:25117}  PLANNED INTERVENTIONS: {rehab planned interventions:25118::"Therapeutic exercises","Therapeutic activity","Neuromuscular re-education","Balance training","Gait training","Patient/Family education","Self Care","Joint mobilization"}.  PLAN FOR NEXT SESSION: Progress ROM/strengthening exercises as able/appropriate, review HEP.    Leeroy Cha PT, DPT 01/29/2023 8:41 AM

## 2023-01-30 ENCOUNTER — Ambulatory Visit: Payer: 59 | Admitting: Physical Therapy

## 2023-02-07 ENCOUNTER — Inpatient Hospital Stay: Payer: 59 | Attending: Family Medicine

## 2023-02-07 ENCOUNTER — Encounter: Payer: Self-pay | Admitting: *Deleted

## 2023-02-07 ENCOUNTER — Inpatient Hospital Stay: Payer: 59 | Admitting: Oncology

## 2023-02-07 NOTE — Progress Notes (Signed)
Patient was "no show" for lab/OV today. Scheduling message sent to reschedule for next 1-2 months.

## 2023-02-13 ENCOUNTER — Telehealth: Payer: Self-pay | Admitting: Oncology

## 2023-02-13 NOTE — Telephone Encounter (Signed)
Contacted patient to reschedule missed appointment per schedule message, Patient advised that at the moment he does not have medicare and wants to hold off in scheduling until he gets insurance back. So patient will call us back to schedule when he obtains his medicare once again.

## 2023-02-15 ENCOUNTER — Encounter: Payer: Self-pay | Admitting: *Deleted

## 2023-02-15 NOTE — Progress Notes (Signed)
"  No show " for lab/OV on 3/7. Scheduling message sent to schedule for 1-2 months

## 2023-04-04 ENCOUNTER — Telehealth: Payer: Self-pay | Admitting: Oncology

## 2023-04-05 ENCOUNTER — Ambulatory Visit: Payer: No Typology Code available for payment source | Admitting: Cardiology

## 2023-04-11 ENCOUNTER — Ambulatory Visit: Payer: No Typology Code available for payment source | Admitting: Cardiology

## 2023-04-11 ENCOUNTER — Encounter: Payer: Self-pay | Admitting: Cardiology

## 2023-04-11 VITALS — BP 123/87 | HR 72 | Resp 16 | Ht 75.0 in | Wt 249.0 lb

## 2023-04-11 DIAGNOSIS — E782 Mixed hyperlipidemia: Secondary | ICD-10-CM

## 2023-04-11 DIAGNOSIS — I480 Paroxysmal atrial fibrillation: Secondary | ICD-10-CM

## 2023-04-11 DIAGNOSIS — F17210 Nicotine dependence, cigarettes, uncomplicated: Secondary | ICD-10-CM

## 2023-04-11 MED ORDER — METOPROLOL SUCCINATE ER 50 MG PO TB24: 50.0000 mg | ORAL_TABLET | Freq: Every day | ORAL | 3 refills | Status: AC

## 2023-04-11 MED ORDER — ATORVASTATIN CALCIUM 40 MG PO TABS: 40.0000 mg | ORAL_TABLET | Freq: Every day | ORAL | 3 refills | Status: AC

## 2023-04-11 MED ORDER — APIXABAN 5 MG PO TABS: 5.0000 mg | ORAL_TABLET | Freq: Two times a day (BID) | ORAL | 3 refills | Status: DC

## 2023-04-11 NOTE — Progress Notes (Signed)
Follow up visit  Subjective:   Gary Frederick, male    DOB: Apr 17, 1956, 67 y.o.   MRN: [id number]   HPI  Chief Complaint  Patient presents with   Atrial Fibrillation   Follow-up    6 month    67 year old African-American male with hypertension, hyperlipidemia, type 2 diabetes mellitus, h/o stroke with minimal residual neurodeficit, PAF, smoker, h/o alcohol abuse, cannabis use, gout, colon cancer in remission  Patient is doing well, denies any cardiac complaints. Last two weeks, his walking is reduced due to back pain, but did not have any chest pain, shortness of breath prior to that.     Current Outpatient Medications:    acetaminophen (TYLENOL) 500 MG tablet, Take 500 mg by mouth every 6 (six) hours as needed., Disp: , Rfl:    allopurinol (ZYLOPRIM) 100 MG tablet, Take 100 mg by mouth daily., Disp: , Rfl:    amLODipine (NORVASC) 10 MG tablet, Take 1 tablet (10 mg total) by mouth daily with lunch., Disp: , Rfl:    apixaban (ELIQUIS) 5 MG TABS tablet, Take 1 tablet (5 mg total) by mouth 2 (two) times daily. **Restart on 08/08/2022**, Disp: 30 tablet, Rfl: 0   atorvastatin (LIPITOR) 40 MG tablet, Take 1 tablet (40 mg total) by mouth daily., Disp: 30 tablet, Rfl: 2   bisacodyl (DULCOLAX) 5 MG EC tablet, Take 10 mg by mouth daily as needed for moderate constipation., Disp: , Rfl:    diclofenac Sodium (VOLTAREN) 1 % GEL, Apply 1 Application topically 4 (four) times daily as needed (pain)., Disp: , Rfl:    docusate sodium (COLACE) 100 MG capsule, Take 1 capsule (100 mg total) by mouth 2 (two) times daily., Disp: 10 capsule, Rfl: 0   esomeprazole (NEXIUM) 20 MG capsule, Take 20 mg by mouth daily as needed (acid reflux)., Disp: , Rfl:    fluticasone (FLONASE) 50 MCG/ACT nasal spray, Place 2 sprays into both nostrils daily as needed for allergies., Disp: , Rfl:    metoprolol succinate (TOPROL-XL) 50 MG 24 hr tablet, Take 50 mg by mouth daily. , Disp: , Rfl:    nicotine (NICODERM CQ -  DOSED IN MG/24 HR) 7 mg/24hr patch, Place 1 patch (7 mg total) onto the skin daily. (Patient taking differently: Place 7 mg onto the skin daily as needed (nicotine dependence).), Disp: 30 patch, Rfl: 3   omega-3 acid ethyl esters (LOVAZA) 1 G capsule, Take 2 g by mouth daily. , Disp: , Rfl:    ONE TOUCH ULTRA TEST test strip, USE 1 STRIP TO CHECK GLUCOSE TWICE DAILY, Disp: , Rfl:    OZEMPIC, 0.25 OR 0.5 MG/DOSE, 2 MG/1.5ML SOPN, Inject 0.5 mg into the skin every Thursday., Disp: , Rfl:    Polyvinyl Alcohol-Povidone (REFRESH OP), Place 1 drop into both eyes daily., Disp: , Rfl:    Semaglutide,0.25 or 0.5MG /DOS, (OZEMPIC, 0.25 OR 0.5 MG/DOSE,) 2 MG/3ML SOPN, Inject 0.5 mg into the skin once a week., Disp: 3 mL, Rfl: 5   Cardiovascular & other pertient studies:  Reviewed external labs and tests, independently interpreted  EKG 04/11/2023: Sinus rhythm 72 bpm  Left axis deviation Possible old anteroseptal infarct  Carotid artery duplex 04/20/2022:  Duplex suggests stenosis in the right internal carotid artery (minimal),  mild heterogenous plaque is noted in the proximal ICA.  Duplex suggests stenosis in the left internal carotid artery (1-15%). Mild  homogenous plaque is noted in the left ICA  Antegrade right vertebral artery flow. Antegrade left vertebral artery  flow.  Follow-up studies if clinically indicated.  Brain MRI 03/16/2022: 1. Acute infarcts in the Right MCA (6 cm, frontal operculum and superior frontal gyrus), Right PCA (right occipital pole), and Right Cerebellar artery territories. Associated MCA and PCA Heidelberg Classification 1B petechial hemorrhage. No significant mass effect.   2. Underlying small chronic lacunar infarcts of the left globus pallidus and cerebellum (left PICA).    Exercise nuclear stress test 11/22/2021: Normal ECG stress. The patient exercised for 7 minutes and 20 seconds of a Bruce protocol, achieving approximately 8.05 METs.  The blood pressure  response was normal. Myocardial perfusion is normal. Overall LV systolic function is normal without regional wall motion abnormalities. Stress LV EF: 62%.  No previous exam available for comparison. Low risk.   Echocardiogram 11/22/2021:  Left ventricle cavity is normal in size. Moderate concentric hypertrophy  of the left ventricle. Normal global wall motion. Normal LV systolic  function with visual EF 61%. Doppler evidence of grade I (impaired)  diastolic dysfunction, normal LAP.  Structurally normal trileaflet aoratic valve. No evidence of aortic  stenosis. Mild (Grade I) aortic regurgitation.  Mild tricuspid regurgitation. Estimated pulmonary artery systolic pressure  30 mmHg.     Recent labs: 08/05/2022: Glucose 111, BUN/Cr 10/0.84. EGFR >60. Na/K 138/3.7. Rest of the CMP normal H/H 13/38. MCV 91. Platelets 167 HbA1C 6.2% Chol 112, TG 108, HDL 39, LDL 53  03/17/2022: Glucose 99, BUN/Cr 7/0.88. EGFR >60. Na/K 140/3.5.  H/H 14/41. MCV 89. Platelets 194 HbA1C 6.6% Chol 181, TG 189, HDL 28, LDL 115 TSH 1.2 normal    Review of Systems  Cardiovascular:  Negative for chest pain, dyspnea on exertion, leg swelling, palpitations and syncope.  Musculoskeletal:  Negative for back pain.         Vitals:   04/11/23 1118  BP: 123/87  Pulse: 72  Resp: 16  SpO2: 97%     Body mass index is 31.12 kg/m. Filed Weights   04/11/23 1118  Weight: 249 lb (112.9 kg)     Objective:   Physical Exam Vitals and nursing note reviewed.  Constitutional:      General: He is not in acute distress. Neck:     Vascular: No JVD.  Cardiovascular:     Rate and Rhythm: Normal rate and regular rhythm.     Heart sounds: Normal heart sounds. No murmur heard. Pulmonary:     Effort: Pulmonary effort is normal.     Breath sounds: Normal breath sounds. No wheezing or rales.  Musculoskeletal:     Right lower leg: No edema.     Left lower leg: No edema.             Visit diagnoses:    ICD-10-CM   1. Paroxysmal atrial fibrillation ([facility])  I48.0 EKG 12-Lead    metoprolol succinate (TOPROL-XL) 50 MG 24 hr tablet    2. Mixed hyperlipidemia  E78.2     3. Nicotine dependence, cigarettes, uncomplicated  F17.210        No orders of the defined types were placed in this encounter.    Assessment & Recommendations:   67 year old African-American male with hypertension, hyperlipidemia, type 2 diabetes mellitus, h/o stroke with minimal residual neurodeficit, PAF, smoker, h/o alcohol abuse, cannabis use, gout, colon cancer in remission  PAF: H/o stroke. CHA2DS2VASc score 3, annual stroke risj 3.2%. Continue Eliquis 5 mg twice daily  Mixed hyperlipidemia: Continue Lipitor 40 mg daily.   Nicotine dependence: Down to <1/2 pack/day Continue nicotine patch  F/u  in 1 year    Nigel Mormon, MD Pager: [phone] Office: [phone]

## 2023-04-25 ENCOUNTER — Ambulatory Visit: Payer: 59 | Admitting: Oncology

## 2023-04-25 ENCOUNTER — Other Ambulatory Visit: Payer: 59

## 2023-05-15 ENCOUNTER — Other Ambulatory Visit: Payer: Self-pay | Admitting: *Deleted

## 2023-05-15 DIAGNOSIS — C184 Malignant neoplasm of transverse colon: Secondary | ICD-10-CM

## 2023-05-21 ENCOUNTER — Inpatient Hospital Stay: Payer: 59 | Admitting: Oncology

## 2023-05-21 ENCOUNTER — Telehealth: Payer: Self-pay | Admitting: Oncology

## 2023-05-21 ENCOUNTER — Inpatient Hospital Stay: Payer: 59

## 2023-05-21 NOTE — Telephone Encounter (Signed)
Contacted patient to scheduled appointments. Left message with appointment details and a call back number if patient had any questions or could not accommodate the time we provided.   

## 2023-05-29 ENCOUNTER — Inpatient Hospital Stay: Payer: Self-pay

## 2023-05-29 ENCOUNTER — Inpatient Hospital Stay: Payer: Self-pay | Admitting: Nurse Practitioner

## 2023-06-14 ENCOUNTER — Other Ambulatory Visit: Payer: Self-pay

## 2023-06-14 ENCOUNTER — Encounter: Payer: Self-pay | Admitting: Neurosurgery

## 2023-06-14 ENCOUNTER — Other Ambulatory Visit: Payer: Self-pay | Admitting: Neurosurgery

## 2023-06-14 DIAGNOSIS — M47816 Spondylosis without myelopathy or radiculopathy, lumbar region: Secondary | ICD-10-CM

## 2023-07-05 ENCOUNTER — Ambulatory Visit
Admission: RE | Admit: 2023-07-05 | Discharge: 2023-07-05 | Disposition: A | Discharge status: Home / Self Care | Payer: No Typology Code available for payment source | Source: Ambulatory Visit | Attending: Neurosurgery | Admitting: Neurosurgery

## 2023-07-05 ENCOUNTER — Other Ambulatory Visit: Payer: No Typology Code available for payment source

## 2023-07-05 DIAGNOSIS — M47816 Spondylosis without myelopathy or radiculopathy, lumbar region: Secondary | ICD-10-CM

## 2023-07-09 ENCOUNTER — Other Ambulatory Visit: Payer: Self-pay | Admitting: Acute Care

## 2023-07-09 DIAGNOSIS — Z122 Encounter for screening for malignant neoplasm of respiratory organs: Secondary | ICD-10-CM

## 2023-07-09 DIAGNOSIS — F17210 Nicotine dependence, cigarettes, uncomplicated: Secondary | ICD-10-CM

## 2023-07-09 DIAGNOSIS — Z87891 Personal history of nicotine dependence: Secondary | ICD-10-CM

## 2023-08-02 ENCOUNTER — Other Ambulatory Visit: Payer: Self-pay

## 2023-08-02 ENCOUNTER — Emergency Department (HOSPITAL_COMMUNITY)
Admission: EM | Admit: 2023-08-02 | Discharge: 2023-08-02 | Disposition: A | Discharge status: Home / Self Care | Payer: Non-veteran care | Attending: Emergency Medicine | Admitting: Emergency Medicine

## 2023-08-02 DIAGNOSIS — Z7901 Long term (current) use of anticoagulants: Secondary | ICD-10-CM | POA: Diagnosis not present

## 2023-08-02 DIAGNOSIS — K029 Dental caries, unspecified: Secondary | ICD-10-CM | POA: Diagnosis not present

## 2023-08-02 DIAGNOSIS — E119 Type 2 diabetes mellitus without complications: Secondary | ICD-10-CM | POA: Diagnosis not present

## 2023-08-02 DIAGNOSIS — K0889 Other specified disorders of teeth and supporting structures: Secondary | ICD-10-CM | POA: Diagnosis present

## 2023-08-02 MED ORDER — PENICILLIN V POTASSIUM 500 MG PO TABS: 500.0000 mg | ORAL_TABLET | Freq: Three times a day (TID) | ORAL | 0 refills | Status: DC

## 2023-08-02 NOTE — ED Provider Notes (Signed)
[place] EMERGENCY DEPARTMENT AT [facility] Provider Note   CSN: [id number] Arrival date & time: 08/02/23  1042     History  Chief Complaint  Patient presents with   Dental Pain    Gary Frederick is a 67 y.o. male.  The history is provided by the patient and medical records. No language interpreter was used.  Dental Pain    67 year old male senting history of polysubstance abuse including alcohol abuse and marijuana abuse with history of diabetes, prior stroke, cancer presenting today with complaint of dental pain.  Patient endorses dental pain to his right upper back molar ongoing for the past 2 days.  Described pain as a throbbing sensation worse with chewing waxing waning but became much worse since last night.  No associated fever or chills no chest pain no shortness of breath no neck pain no abnormal taste in the mouth.  Does not endorse any heat or cold intolerance.  He did try taking Tylenol which did provide some relief.  He denies any trauma.  Home Medications Prior to Admission medications   Medication Sig Start Date End Date Taking? Authorizing Provider  acetaminophen (TYLENOL) 500 MG tablet Take 500 mg by mouth every 6 (six) hours as needed.    [provider]  allopurinol (ZYLOPRIM) 100 MG tablet Take 100 mg by mouth daily. 09/29/21   [provider]  amLODipine (NORVASC) 10 MG tablet Take 1 tablet (10 mg total) by mouth daily with lunch. 03/18/22   Rodolph Bong, MD  apixaban (ELIQUIS) 5 MG TABS tablet Take 1 tablet (5 mg total) by mouth 2 (two) times daily. **Restart on 08/08/2022** 04/11/23   Patwardhan, Anabel Bene, MD  atorvastatin (LIPITOR) 40 MG tablet Take 1 tablet (40 mg total) by mouth daily. 04/11/23   Patwardhan, Anabel Bene, MD  bisacodyl (DULCOLAX) 5 MG EC tablet Take 10 mg by mouth daily as needed for moderate constipation.    [provider]  diclofenac Sodium (VOLTAREN) 1 % GEL Apply 1 Application topically 4 (four)  times daily as needed (pain).    [provider]  docusate sodium (COLACE) 100 MG capsule Take 1 capsule (100 mg total) by mouth 2 (two) times daily. 08/04/22   Val Eagle D, NP  empagliflozin (JARDIANCE) 25 MG TABS tablet Take 12.5 mg by mouth daily. 01/30/23   [provider]  esomeprazole (NEXIUM) 20 MG capsule Take 20 mg by mouth daily as needed (acid reflux).    [provider]  fluticasone (FLONASE) 50 MCG/ACT nasal spray Place 2 sprays into both nostrils daily as needed for allergies. 03/28/22   [provider]  metoprolol succinate (TOPROL-XL) 50 MG 24 hr tablet Take 1 tablet (50 mg total) by mouth daily. 04/11/23   Patwardhan, Anabel Bene, MD  omega-3 acid ethyl esters (LOVAZA) 1 G capsule Take 2 g by mouth daily.  05/28/13   [provider]  ONE TOUCH ULTRA TEST test strip USE 1 STRIP TO CHECK GLUCOSE TWICE DAILY 12/31/18   [provider]  Polyvinyl Alcohol-Povidone (REFRESH OP) Place 1 drop into both eyes daily.    [provider]      Allergies    Patient has no known allergies.    Review of Systems   Review of Systems  All other systems reviewed and are negative.   Physical Exam Updated Vital Signs BP (!) 130/95 (BP Location: Left Arm)   Pulse 82   Temp 98.3 F (36.8 C) (Oral)  Resp 16   Ht 6\' 3"  (1.905 m)   Wt 112.9 kg   SpO2 95%   BMI 31.12 kg/m  Physical Exam Vitals and nursing note reviewed.  Constitutional:      General: He is not in acute distress.    Appearance: He is well-developed.  HENT:     Head: Atraumatic.     Mouth/Throat:     Comments: Mild tenderness noted to tooth #1 and 2 without any obvious visualized abscess noted.  Mild dental decay noted.  Mild facial swelling noted.  No stridor or trismus. Eyes:     Conjunctiva/sclera: Conjunctivae normal.  Musculoskeletal:     Cervical back: Normal range of motion and neck supple. No rigidity.  Lymphadenopathy:     Cervical: No cervical  adenopathy.  Skin:    Findings: No rash.  Neurological:     Mental Status: He is alert.     ED Results / Procedures / Treatments   Labs (all labs ordered are listed, but only abnormal results are displayed) Labs Reviewed - No data to display  EKG None  Radiology No results found.  Procedures Procedures    Medications Ordered in ED Medications - No data to display  ED Course/ Medical Decision Making/ A&P                                 Medical Decision Making  BP (!) 130/95 (BP Location: Left Arm)   Pulse 82   Temp 98.3 F (36.8 C) (Oral)   Resp 16   Ht 6\' 3"  (1.905 m)   Wt 112.9 kg   SpO2 95%   BMI 31.12 kg/m   83:58 AM  67 year old male senting history of polysubstance abuse including alcohol abuse and marijuana abuse with history of diabetes, prior stroke, cancer presenting today with complaint of dental pain.  Patient endorses dental pain to his right upper back molar ongoing for the past 2 days.  Described pain as a throbbing sensation worse with chewing waxing waning but became much worse since last night.  No associated fever or chills no chest pain no shortness of breath no neck pain no abnormal taste in the mouth.  Does not endorse any heat or cold intolerance.  He did try taking Tylenol which did provide some relief.  He denies any trauma.  On exam this is a well-appearing elderly male sitting comfortably in the chair appears to be in no acute discomfort.  On mouth exam he does have some tenderness to palpation of the right upper molar without any obvious gingivitis of facial abscess noted.  No other concerning feature.  DDx: Dental abscess, pulpitis, periodontal disease, gingivitis, periapical abscess with facial involvement.  No suspicion for cardiac illness or other acute emergent medical condition.  Vital signs overall reassuring.  Will discharge home with antibiotic and Tylenol and will have patient follow-up closely with dentist for further care.   Imaging including maxillofacial CT scan and labs including CBC BMP troponin and EKG considered but not performed as I have low suspicion for acute pathology aside from dental infection.  Social determinant health including tobacco use.        Final Clinical Impression(s) / ED Diagnoses Final diagnoses:  Pain due to dental caries    Rx / DC Orders ED Discharge Orders          Ordered    penicillin v potassium (VEETID) 500 MG tablet  3 times daily        08/02/23 1131              Fayrene Helper, [place] 08/02/23 1132    Tanda Rockers A, DO 08/03/23 [phone]

## 2023-08-02 NOTE — ED Triage Notes (Signed)
Patient arrives for eval of two days of R upper dental pain. States pain is some better now than it was this morning after taking tylenol.

## 2023-09-06 ENCOUNTER — Ambulatory Visit: Payer: Non-veteran care | Admitting: Cardiology

## 2023-09-06 NOTE — Progress Notes (Deleted)
Electrophysiology Office Note:   Date:  09/06/2023  ID:  Chipper Herb, DOB 07-27-1956, MRN [id number]  Primary Cardiologist: None Electrophysiologist: None  {Click to update primary MD,subspecialty MD or APP then REFRESH:1}    History of Present Illness:   PRITESH AURINGER is a 67 y.o. male with h/o tension, hyperlipidemia, diabetes, CVA, atrial fibrillation, tobacco abuse, alcohol abuse, cannabis abuse seen today for  for Electrophysiology evaluation of atrial fibrillation at the request of Manish Patwardhan.     Review of systems complete and found to be negative unless listed in HPI.   EP Information / Studies Reviewed:    {EKGtoday:28818}        Risk Assessment/Calculations:    CHA2DS2-VASc Score =    {Click here to calculate score.  REFRESH note before signing. :1} This indicates a  % annual risk of stroke. The patient's score is based upon:      No BP recorded.  {Refresh Note OR Click here to enter BP  :1}***        Physical Exam:   VS:  There were no vitals taken for this visit.   Wt Readings from Last 3 Encounters:  08/02/23 249 lb (112.9 kg)  04/11/23 249 lb (112.9 kg)  10/05/22 250 lb (113.4 kg)     GEN: Well nourished, well developed in no acute distress NECK: No JVD; No carotid bruits CARDIAC: {EPRHYTHM:28826}, no murmurs, rubs, gallops RESPIRATORY:  Clear to auscultation without rales, wheezing or rhonchi  ABDOMEN: Soft, non-tender, non-distended EXTREMITIES:  No edema; No deformity   ASSESSMENT AND PLAN:   ***  Follow up with {WUJWJ:19147} {EPFOLLOW WG:95621}  Signed, Valdemar Mcclenahan Jorja Loa, MD

## 2023-09-30 ENCOUNTER — Other Ambulatory Visit: Payer: Self-pay

## 2023-10-02 ENCOUNTER — Inpatient Hospital Stay: Admission: RE | Admit: 2023-10-02 | Payer: No Typology Code available for payment source | Source: Ambulatory Visit

## 2023-10-16 ENCOUNTER — Ambulatory Visit: Payer: No Typology Code available for payment source | Attending: Cardiology | Admitting: Cardiology

## 2023-10-16 ENCOUNTER — Encounter: Payer: Self-pay | Admitting: Cardiology

## 2023-10-16 VITALS — BP 110/82 | HR 68 | Resp 16 | Ht 75.0 in | Wt 246.0 lb

## 2023-10-16 DIAGNOSIS — E782 Mixed hyperlipidemia: Secondary | ICD-10-CM | POA: Diagnosis not present

## 2023-10-16 DIAGNOSIS — I480 Paroxysmal atrial fibrillation: Secondary | ICD-10-CM | POA: Diagnosis not present

## 2023-10-16 NOTE — Patient Instructions (Signed)
Medication Instructions:   Your physician recommends that you continue on your current medications as directed. Please refer to the Current Medication list given to you today.  *If you need a refill on your cardiac medications before your next appointment, please call your pharmacy*   Lab Work:  TODAY--LIPIDS  If you have labs (blood work) drawn today and your tests are completely normal, you will receive your results only by: MyChart Message (if you have MyChart) OR A paper copy in the mail If you have any lab test that is abnormal or we need to change your treatment, we will call you to review the results.   Follow-Up: At [facility], you and your health needs are our priority.  As part of our continuing mission to provide you with exceptional heart care, we have created designated Provider Care Teams.  These Care Teams include your primary Cardiologist (physician) and Advanced Practice Providers (APPs -  Physician Assistants and Nurse Practitioners) who all work together to provide you with the care you need, when you need it.  We recommend signing up for the patient portal called "MyChart".  Sign up information is provided on this After Visit Summary.  MyChart is used to connect with patients for Virtual Visits (Telemedicine).  Patients are able to view lab/test results, encounter notes, upcoming appointments, etc.  Non-urgent messages can be sent to your provider as well.   To learn more about what you can do with MyChart, go to [email or web address].    Your next appointment:   1 year(s)  Provider:   Jari Favre, PA-C, Ronie Spies, PA-C, Robin Searing, NP, Jacolyn Reedy, PA-C, Eligha Bridegroom, NP, Tereso Newcomer, PA-C, or Perlie Gold, PA-C          2 YEARS WITH DR. PATWARDHAN

## 2023-10-16 NOTE — Progress Notes (Signed)
  Cardiology Office Note:  .   Date:  10/16/2023  ID:  Gary Frederick, DOB 08/02/1956, MRN [id number] [facility]: [facility]  [facility] Providers Cardiologist:  Truett Mainland, MD PCP: [facility]  Chief Complaint  Patient presents with   Paroxysmal atrial fibrillation   Follow-up      History of Present Illness: .    Gary Frederick is a 67 y.o. male with hypertension, hyperlipidemia, type 2 diabetes mellitus, h/o stroke with minimal residual neurodeficit, PAF, smoker, h/o alcohol abuse, cannabis use, gout, colon cancer in remission   Patient is doing well. He denies chest pain, shortness of breath, palpitations, leg edema, orthopnea, PND, TIA/syncope.   Vitals:   10/16/23 0916  BP: 110/82  Pulse: 68  Resp: 16  SpO2: 93%     ROS:  Review of Systems  Cardiovascular:  Negative for chest pain, dyspnea on exertion, leg swelling, palpitations and syncope.     Studies Reviewed: Marland Kitchen       EKG 10/16/2023: Sinus rhythm with 1st degree A-V block Left axis deviation Nonspecific T wave abnormality When compared with ECG of 17-Mar-2022 17:14, No significant change was found     Risk Assessment/Calculations:    CHA2DS2-VASc Score = 2   This indicates a 3.2 % annual risk of stroke. The patient's score is based upon:        Physical Exam:   Physical Exam Vitals and nursing note reviewed.  Constitutional:      General: He is not in acute distress. Neck:     Vascular: No JVD.  Cardiovascular:     Rate and Rhythm: Normal rate and regular rhythm.     Heart sounds: Normal heart sounds. No murmur heard. Pulmonary:     Effort: Pulmonary effort is normal.     Breath sounds: Normal breath sounds. No wheezing or rales.  Musculoskeletal:     Right lower leg: No edema.     Left lower leg: No edema.      VISIT DIAGNOSES:   ICD-10-CM   1. Paroxysmal atrial fibrillation (HCC)  I48.0 EKG 12-Lead    2. Mixed hyperlipidemia  E78.2  Lipid Profile       ASSESSMENT AND PLAN: .    Gary Frederick is a 67 y.o. male with hypertension, hyperlipidemia, type 2 diabetes mellitus, h/o stroke with minimal residual neurodeficit, PAF, smoker, h/o alcohol abuse, cannabis use, gout, colon cancer in remission   PAF: H/o stroke. CHA2DS2VASc score 3, annual stroke risk 3.2%. Continue Eliquis 5 mg twice daily   Mixed hyperlipidemia: Continue Lipitor 40 mg daily.  Check lipid panel today.    F/u in 1 year  Signed, Elder Negus, MD

## 2023-10-17 LAB — LIPID PANEL
Chol/HDL Ratio: 3.7 ratio (ref 0.0–5.0)
Cholesterol, Total: 137 mg/dL (ref 100–199)
HDL: 37 mg/dL — ABNORMAL LOW (ref >39)
LDL Chol Calc (NIH): 79 mg/dL (ref 0–99)
Triglycerides: 118 mg/dL (ref 0–149)
VLDL Cholesterol Cal: 21 mg/dL (ref 5–40)

## 2023-11-12 ENCOUNTER — Ambulatory Visit: Payer: Non-veteran care | Admitting: Gastroenterology

## 2023-11-20 ENCOUNTER — Telehealth: Payer: Self-pay

## 2023-11-20 ENCOUNTER — Ambulatory Visit: Payer: No Typology Code available for payment source | Admitting: Gastroenterology

## 2023-11-20 VITALS — BP 138/98 | HR 67 | Ht 75.0 in | Wt 249.6 lb

## 2023-11-20 DIAGNOSIS — Z860100 Personal history of colon polyps, unspecified: Secondary | ICD-10-CM

## 2023-11-20 DIAGNOSIS — K219 Gastro-esophageal reflux disease without esophagitis: Secondary | ICD-10-CM

## 2023-11-20 DIAGNOSIS — Z85038 Personal history of other malignant neoplasm of large intestine: Secondary | ICD-10-CM | POA: Diagnosis not present

## 2023-11-20 DIAGNOSIS — K5909 Other constipation: Secondary | ICD-10-CM | POA: Diagnosis not present

## 2023-11-20 MED ORDER — NA SULFATE-K SULFATE-MG SULF 17.5-3.13-1.6 GM/177ML PO SOLN: 1.0000 | ORAL | 0 refills | Status: DC

## 2023-11-20 NOTE — Progress Notes (Signed)
Chief Complaint: EGD/colonoscopy Primary GI MD: Gentry Fitz  HPI: 67 year old male history of hypertension, hyperlipidemia, type 2 diabetes, stroke with minimal residual neurodeficit, PAF (on Eliquis), smoker, alcohol abuse, cannabis use, colon cancer s/p right sided colectomy (?partial versus hemi) in 2014, CVA April 2023, presents for evaluation of EGD/colonoscopy  Recently seen by cardiology November 2024.  Patient has a history of paroxysmal atrial fibrillation is on Eliquis twice daily and recent CVA 03/2022.  48  pages of referral reviewed. He was seen by [facility] clinic 10/04/2023 at the request of Dr. Karie Kirks of oncology.  He has history of colon cancer s/p Hemi or partial colectomy in 2014 (unclear from records).  No chemo/radiation.  Patient has had multiple previous colonoscopies with Dr. Elnoria Howard (we will obtain these records).  Reported last colonoscopy 3 to 4 years ago with several polyps.  Apparently he was supposed to have a repeat colonoscopy last year but this was deferred due to his stroke history.   Colonoscopy with Dr. Elnoria Howard August 2020 - 8 sessile polyps found in the rectum, rectosigmoid colon, descending colon, transverse colon.  These were 2 to 4 mm in size.  Removed with cold snare. ( No pathology available). - Evidence of prior functional end-to-end ileocolonic anastomosis in the transverse colon - Repeat 3 years  At his visit with his GI he reported right sided abdominal pain with relief after bowel movements.  Chronic constipation ongoing since that surgery.  He will often use stool softeners regularly and strain with bowel movements.  Associated with straining he does have a history of hemorrhoids with occasional BRBPR on the tissue paper.  He was given Senokot for this.  He reported longstanding GERD history that is well-controlled on Nexium when he takes it.  Rare instances of dysphagia with certain foods such as barbecue.  His GI from the [place] has referred him to [place] to  request EGD/colonoscopy.  No previous history of EGD and longstanding GERD they recommended to rule out Barrett's esophagus.  ----------------TODAY-------------------  Patient states he does have a longstanding history of chronic constipation.  States he tried MiraLAX with no relief.  He did not want to take the Senokot provided as he does not want to be on a daily medication for constipation.  He states he takes Dulcolax about once per week and he typically has a bowel movement every single day without difficulty.  Not interested in being on a daily constipation medication.  He states he takes his Nexium twice per week and this provides adequate control of his GERD.  Rare breakthrough symptoms.  No evidence of dysphagia.  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 Elnoria Howard August 2020 - 8 sessile polyps found in the rectum, rectosigmoid colon, descending colon, transverse colon.  These were 2 to 4 mm in size.  Removed with cold snare. ( No pathology available). - Evidence of prior functional end-to-end ileocolonic anastomosis in the transverse colon - Repeat 3 years  Past Medical History:  Diagnosis Date   ARF (acute renal failure) (HCC) 03/15/2022   Arthritis    Cancer (HCC) 10/03/2012   Colon   Constipation    CVA (cerebral vascular accident) (HCC) 03/15/2022   Degenerative arthritis    Diabetes mellitus without complication (HCC)    Dysrhythmia    A fib   Essential hypertension 11/23/2006   Qualifier: Diagnosis of   By: Sherlon Handing MD, Larene Pickett  GERD (gastroesophageal reflux disease)    Gout    History of adenomatous and serrated colon polyps 07/2019   Dr. Elnoria Howard - [facility]   Hyperlipidemia    Hypertension    Insomnia    Intervertebral disc syndrome 2024   Paroxysmal atrial fibrillation (HCC)    Stroke (HCC) 03/15/2022   T2DM (type 2 diabetes mellitus) (HCC) 03/15/2022    Past Surgical History:   Procedure Laterality Date   BACK SURGERY  08/2022   spinal fusion   CARPAL TUNNEL RELEASE     right   COLON RESECTION  12/11/2012   Procedure: LAPAROSCOPIC RIGHT COLON RESECTION;  Surgeon: Romie Levee, MD;  Location: [facility];  Service: General;  Laterality: Right;   COLON SURGERY  12/11/2012   KNEE ARTHROSCOPY     LIVER ULTRASOUND  12/11/2012   Procedure: LIVER ULTRASOUND;  Surgeon: Romie Levee, MD;  Location: [facility];  Service: General;  Laterality: Right;  Intraoperative Liver Ultrasound    Current Outpatient Medications  Medication Sig Dispense Refill   acetaminophen (TYLENOL) 500 MG tablet Take 500 mg by mouth every 6 (six) hours as needed.     allopurinol (ZYLOPRIM) 100 MG tablet Take 100 mg by mouth daily.     amLODipine (NORVASC) 10 MG tablet Take 1 tablet (10 mg total) by mouth daily with lunch.     apixaban (ELIQUIS) 5 MG TABS tablet Take 1 tablet (5 mg total) by mouth 2 (two) times daily. **Restart on 08/08/2022** 180 tablet 3   atorvastatin (LIPITOR) 40 MG tablet Take 1 tablet (40 mg total) by mouth daily. 90 tablet 3   bisacodyl (DULCOLAX) 5 MG EC tablet Take 10 mg by mouth daily as needed for moderate constipation.     diclofenac Sodium (VOLTAREN) 1 % GEL Apply 1 Application topically 4 (four) times daily as needed (pain).     docusate sodium (COLACE) 100 MG capsule Take 1 capsule (100 mg total) by mouth 2 (two) times daily. 10 capsule 0   empagliflozin (JARDIANCE) 25 MG TABS tablet Take 12.5 mg by mouth daily.     esomeprazole (NEXIUM) 20 MG capsule Take 20 mg by mouth daily as needed (acid reflux).     fluticasone (FLONASE) 50 MCG/ACT nasal spray Place 2 sprays into both nostrils daily as needed for allergies.     metoprolol succinate (TOPROL-XL) 50 MG 24 hr tablet Take 1 tablet (50 mg total) by mouth daily. 90 tablet 3   Na Sulfate-K Sulfate-Mg Sulf 17.5-3.13-1.6 GM/177ML SOLN Take 1 kit by mouth as directed. 354 mL 0   ONE TOUCH ULTRA TEST test strip USE 1 STRIP TO  CHECK GLUCOSE TWICE DAILY     No current facility-administered medications for this visit.    Allergies as of 11/20/2023 - Review Complete 11/20/2023  Allergen Reaction Noted   Metformin and related  11/20/2023    Family History  Problem Relation Age of Onset   Diabetes Mother    Stroke Mother    Huntington's disease Father    Hypertension Brother     Social History   Socioeconomic History   Marital status: Married    Spouse name: Surveyor, minerals   Number of children: 4   Years of education: Not on file   Highest education level: Not on file  Occupational History   Occupation: Trucking    Comment: Disabled  Tobacco Use   Smoking status: Every Day    Current packs/day: 0.50    Average packs/day: 0.5 packs/day for 30.0 years (  15.0 ttl pk-yrs)    Types: Cigarettes   Smokeless tobacco: Never  Vaping Use   Vaping status: Never Used  Substance and Sexual Activity   Alcohol use: Not Currently    Alcohol/week: 5.0 standard drinks of alcohol    Types: 5 Cans of beer per week   Drug use: No   Sexual activity: Not on file  Other Topics Concern   Not on file  Social History Narrative   Not on file   Social Drivers of Health   Financial Resource Strain: Not on file  Food Insecurity: Not on file  Transportation Needs: Not on file  Physical Activity: Not on file  Stress: Not on file  Social Connections: Not on file  Intimate Partner Violence: Not on file    Review of Systems:    Constitutional: No weight loss, fever, chills, weakness or fatigue HEENT: Eyes: No change in vision               Ears, Nose, Throat:  No change in hearing or congestion Skin: No rash or itching Cardiovascular: No chest pain, chest pressure or palpitations   Respiratory: No SOB or cough Gastrointestinal: See HPI and otherwise negative Genitourinary: No dysuria or change in urinary frequency Neurological: No headache, dizziness or syncope Musculoskeletal: No new muscle or joint pain Hematologic:  No bleeding or bruising Psychiatric: No history of depression or anxiety    Physical Exam:  Vital signs: BP (!) 138/98   Pulse 67   Ht 6\' 3"  (1.905 m)   Wt 249 lb 9.6 oz (113.2 kg)   BMI 31.20 kg/m   Constitutional: NAD, Well developed, Well nourished, alert and cooperative Head:  Normocephalic and atraumatic. Eyes:   PEERL, EOMI. No icterus. Conjunctiva pink. Respiratory: Respirations even and unlabored. Lungs clear to auscultation bilaterally.   No wheezes, crackles, or rhonchi.  Cardiovascular:  Regular rate and rhythm. No peripheral edema, cyanosis or pallor.  Gastrointestinal:  Soft, nondistended, nontender. No rebound or guarding. Normal bowel sounds. No appreciable masses or hepatomegaly. Rectal:  Not performed.  Msk:  Symmetrical without gross deformities. Without edema, no deformity or joint abnormality.  Neurologic:  Alert and  oriented x4;  grossly normal neurologically.  Skin:   Dry and intact without significant lesions or rashes. Psychiatric: Oriented to person, place and time. Demonstrates good judgement and reason without abnormal affect or behaviors.   RELEVANT LABS AND IMAGING: CBC    Component Value Date/Time   WBC 10.5 08/05/2022 1607   RBC 4.18 (L) 08/05/2022 1607   HGB 13.0 08/05/2022 1607   HCT 38.2 (L) 08/05/2022 1607   PLT 167 08/05/2022 1607   MCV 91.4 08/05/2022 1607   MCH 31.1 08/05/2022 1607   MCHC 34.0 08/05/2022 1607   RDW 12.8 08/05/2022 1607   LYMPHSABS 1.8 03/15/2022 1110   MONOABS 1.1 (H) 03/15/2022 1110   EOSABS 0.0 03/15/2022 1110   BASOSABS 0.0 03/15/2022 1110    CMP     Component Value Date/Time   NA 138 08/05/2022 1607   K 3.7 08/05/2022 1607   CL 104 08/05/2022 1607   CO2 25 08/05/2022 1607   GLUCOSE 111 (H) 08/05/2022 1607   BUN 10 08/05/2022 1607   CREATININE 0.84 08/05/2022 1607   CREATININE 0.91 03/25/2013 1512   CALCIUM 8.4 (L) 08/05/2022 1607   PROT 6.7 08/05/2022 1607   ALBUMIN 3.5 08/05/2022 1607   AST 32  08/05/2022 1607   ALT 23 08/05/2022 1607   ALKPHOS 44 08/05/2022 1607  BILITOT 1.1 08/05/2022 1607   GFRNONAA >60 08/05/2022 1607   GFRAA >60 02/19/2020 0452     Assessment/Plan:   History of colon cancer History of colonic polyps Personal history of colon cancer s/p right hemicolectomy in 2014 with colonoscopy in 2020 showing 8 sessile polyps with repeat recommended in 2023.  This was deferred due to CVA April 2023. - Schedule colonoscopy - Will obtain blood thinner cease from cardiologist and hold Eliquis for 2 days - I thoroughly discussed the procedure with the patient (at bedside) to include nature of the procedure, alternatives, benefits, and risks (including but not limited to bleeding, infection, perforation, anesthesia/cardiac pulmonary complications).  Patient verbalized understanding and gave verbal consent to proceed with procedure.   Gastroesophageal reflux disease, unspecified whether esophagitis present Longstanding history of GERD currently well-controlled on Nexium twice per week.  Due to longstanding history of GERD, no previous history of EGD to reasonable to do EGD to rule out Barrett's.   - EGD for further evaluation - I thoroughly discussed the procedure with the patient (at bedside) to include nature of the procedure, alternatives, benefits, and risks (including but not limited to bleeding, infection, perforation, anesthesia/cardiac pulmonary complications).  Patient verbalized understanding and gave verbal consent to proceed with procedure.   Chronic constipation Chronic constipation s/p right colectomy in 2014.  Not interested in being on a daily medication.  He feels that is currently well-controlled with taking Dulcolax liquid once weekly.  He feels his bowel movements are adequate and he has 1/day without difficulty. - Can continue Dulcolax - If worsening symptoms would recommend trial of Linzess since he has failed MiraLAX in the past  Natesha Hassey [facility] 11/20/2023, 1:36 PM  Cc: [facility]

## 2023-11-20 NOTE — Telephone Encounter (Signed)
LM for Marchelle Folks at the [place] [phone]) asking for where to fax request for patient to hold Eliquis for 5 days prior to procedure. LAST 4: 8725

## 2023-11-20 NOTE — Patient Instructions (Addendum)
You have been scheduled for an endoscopy and colonoscopy with Dr. Barron Alvine. Please follow the written instructions given to you at your visit today.  Please pick up your prep supplies at the pharmacy within the next 1-3 days.  If you use inhalers (even only as needed), please bring them with you on the day of your procedure.  DO NOT TAKE 7 DAYS PRIOR TO TEST- Trulicity (dulaglutide) Ozempic, Wegovy (semaglutide) Mounjaro (tirzepatide) Bydureon Bcise (exanatide extended release)  DO NOT TAKE 1 DAY PRIOR TO YOUR TEST Rybelsus (semaglutide) Adlyxin (lixisenatide) Victoza (liraglutide) Byetta (exanatide) ___________________________________________________________________________   Gary Frederick will be contacted by our office prior to your procedure for directions on holding your ELIQUIS.  If you do not hear from our office 1 week prior to your scheduled procedure, please call [phone] to discuss.   Thank you for trusting me with your gastrointestinal care!   Boone Master, PA   If your blood pressure at your visit was 140/90 or greater, please contact your primary care physician to follow up on this. ______________________________________________________  If you are age 48 or older, your body mass index should be between 23-30. Your Body mass index is 31.2 kg/m. If this is out of the aforementioned range listed, please consider follow up with your Primary Care Provider.  If you are age 22 or younger, your body mass index should be between 19-25. Your Body mass index is 31.2 kg/m. If this is out of the aformentioned range listed, please consider follow up with your Primary Care Provider.  ________________________________________________________  The [facility] providers would like to encourage you to use [facility] to communicate with providers for non-urgent requests or questions.  Due to long hold times on the telephone, sending your provider a message by [facility] may be a faster and  more efficient way to get a response.  Please allow 48 business hours for a response.  Please remember that this is for non-urgent requests.  _______________________________________________________  Due to recent changes in healthcare laws, you may see the results of your imaging and laboratory studies on MyChart before your provider has had a chance to review them.  We understand that in some cases there may be results that are confusing or concerning to you. Not all laboratory results come back in the same time frame and the provider may be waiting for multiple results in order to interpret others.  Please give [place] 48 hours in order for your provider to thoroughly review all the results before contacting the office for clarification of your results.

## 2023-11-21 NOTE — Progress Notes (Signed)
Agree with the assessment and plan as outlined by Boone Master, PA-C.  Ahriana Gunkel, DO, [facility]

## 2023-11-28 NOTE — Telephone Encounter (Signed)
[facility] and was told to [facility] request to [phone]. The nurse navigator for GI is Rudell Cobb A: phone [phone]. Anti coag clearance letter faxed to [phone].

## 2023-12-05 ENCOUNTER — Telehealth: Payer: Self-pay | Admitting: Gastroenterology

## 2023-12-05 NOTE — Telephone Encounter (Signed)
 Called [facility] and Left detailed message and requested call or fax back re: request to hold Eliquis prior to procedure

## 2023-12-05 NOTE — Telephone Encounter (Signed)
 Inbound call from [facility] pharmacy stating that they do not have Suprep for patient procedure for 2/18. Requesting we send in MoviPrep or Golytely. Please advise.

## 2023-12-06 NOTE — Telephone Encounter (Signed)
 Rec'd call from [place].  He asked me to fax request to: [phone] to his Attn.

## 2023-12-12 NOTE — Telephone Encounter (Signed)
 Called and spoke to [place].  He requested that I email him the request to [email or web address]. Sent

## 2023-12-13 ENCOUNTER — Encounter (HOSPITAL_COMMUNITY): Payer: Self-pay | Admitting: *Deleted

## 2023-12-13 ENCOUNTER — Ambulatory Visit (INDEPENDENT_AMBULATORY_CARE_PROVIDER_SITE_OTHER): Payer: No Typology Code available for payment source

## 2023-12-13 ENCOUNTER — Ambulatory Visit (HOSPITAL_COMMUNITY)
Admission: EM | Admit: 2023-12-13 | Discharge: 2023-12-13 | Disposition: A | Discharge status: Home / Self Care | Payer: No Typology Code available for payment source | Attending: Emergency Medicine | Admitting: Emergency Medicine

## 2023-12-13 DIAGNOSIS — M79671 Pain in right foot: Secondary | ICD-10-CM | POA: Diagnosis not present

## 2023-12-13 DIAGNOSIS — F17210 Nicotine dependence, cigarettes, uncomplicated: Secondary | ICD-10-CM | POA: Insufficient documentation

## 2023-12-13 DIAGNOSIS — M2011 Hallux valgus (acquired), right foot: Secondary | ICD-10-CM | POA: Diagnosis not present

## 2023-12-13 DIAGNOSIS — Z79899 Other long term (current) drug therapy: Secondary | ICD-10-CM | POA: Diagnosis not present

## 2023-12-13 DIAGNOSIS — G589 Mononeuropathy, unspecified: Secondary | ICD-10-CM

## 2023-12-13 DIAGNOSIS — E1141 Type 2 diabetes mellitus with diabetic mononeuropathy: Secondary | ICD-10-CM | POA: Diagnosis not present

## 2023-12-13 DIAGNOSIS — Z8781 Personal history of (healed) traumatic fracture: Secondary | ICD-10-CM | POA: Diagnosis not present

## 2023-12-13 DIAGNOSIS — M109 Gout, unspecified: Secondary | ICD-10-CM | POA: Diagnosis not present

## 2023-12-13 DIAGNOSIS — Z7984 Long term (current) use of oral hypoglycemic drugs: Secondary | ICD-10-CM | POA: Diagnosis not present

## 2023-12-13 LAB — URIC ACID: Uric Acid, Serum: 4.1 mg/dL (ref 3.7–8.6)

## 2023-12-13 MED ORDER — GABAPENTIN 300 MG PO CAPS: 300.0000 mg | ORAL_CAPSULE | Freq: Four times a day (QID) | ORAL | 0 refills | Status: AC

## 2023-12-13 NOTE — ED Triage Notes (Signed)
 Pt states that he has right foot pain/burning x 2 days. He took some tylenol. He states he has type 2 DM so he does have some foot pain normally but its worse. He also had gout but takes meds daily.

## 2023-12-13 NOTE — Discharge Instructions (Signed)
 The x-ray of your foot did not show any acute fracture but it did show that you have broken your right great toe in the past.  Because of this history of fracture as well as some degenerative arthritis, it is possible that the your foot is a nerve pain.  Either way, gabapentin  will be helpful in lessening this pain and helping your foot feel better.  Please take 1 tablet 3 times daily.  If you find that your pain is worse at night, you are welcome to take 2 tablets at bedtime.  Please be sure that you follow-up with your primary care provider for further evaluation and to discuss possible referral to podiatry.  Orthotic shoe inserts may be of benefit for you.  Thank you for visiting [facility] today.

## 2023-12-13 NOTE — ED Provider Notes (Signed)
 [facility]    CSN: [id number] Arrival date & time: 12/13/23  1356    HISTORY   Chief Complaint  Patient presents with   Foot Burn   HPI Gary Frederick is a pleasant, 68 y.o. male who presents to urgent care today. Patient complains of a 2-day history of pain and burning in his right foot around the ball of his foot.  Patient states he is type 2 diabetes and endorses generalized foot pain anyway but states his foot pain is worse than normal.  Patient reports a history of gout but states he takes allopurinol  daily.  Patient rates his pain 7 out of 10, states it is not similar to gout, is not excruciatingly painful to walk on, does not feel red or swollen.  EMR reviewed, patient does have reasonably well-controlled type 2 diabetes.  The history is provided by the patient.   Past Medical History:  Diagnosis Date   ARF (acute renal failure) (HCC) 03/15/2022   Arthritis    Cancer (HCC) 10/03/2012   Colon   Constipation    CVA (cerebral vascular accident) (HCC) 03/15/2022   Degenerative arthritis    Diabetes mellitus without complication (HCC)    Dysrhythmia    A fib   Essential hypertension 11/23/2006   Qualifier: Diagnosis of   By: Evern MD, Janetta         GERD (gastroesophageal reflux disease)    Gout    History of adenomatous and serrated colon polyps 07/2019   Dr. Rollin - [facility]   Hyperlipidemia    Hypertension    Insomnia    Intervertebral disc syndrome 2024   Paroxysmal atrial fibrillation (HCC)    Stroke (HCC) 03/15/2022   T2DM (type 2 diabetes mellitus) (HCC) 03/15/2022   Patient Active Problem List   Diagnosis Date Noted   Lumbar spinal stenosis 08/03/2022   Nicotine  dependence, cigarettes, uncomplicated 07/05/2022   NSVT (nonsustained ventricular tachycardia) (HCC) 03/17/2022   Dysarthria    Facial droop    Paroxysmal atrial fibrillation (HCC)    Hyperlipidemia    CVA (cerebral vascular accident) (HCC) 03/15/2022   Alcohol  abuse 03/15/2022   Marijuana abuse 03/15/2022   T2DM (type 2 diabetes mellitus) (HCC) 03/15/2022   ARF (acute renal failure) (HCC) 03/15/2022   Colon cancer (HCC) 10/21/2012   HYPERLIPIDEMIA 12/04/2006   LYMPHADENOPATHY 12/04/2006   CARPAL TUNNEL RELEASE, RIGHT, HX OF 12/04/2006   GOUT 11/23/2006   ALCOHOL ABUSE 11/23/2006   TOBACCO ABUSE 11/23/2006   Essential hypertension 11/23/2006   Past Surgical History:  Procedure Laterality Date   BACK SURGERY  08/2022   spinal fusion   CARPAL TUNNEL RELEASE     right   COLON RESECTION  12/11/2012   Procedure: LAPAROSCOPIC RIGHT COLON RESECTION;  Surgeon: Bernarda Ned, MD;  Location: [facility];  Service: General;  Laterality: Right;   COLON SURGERY  12/11/2012   KNEE ARTHROSCOPY     KNEE SURGERY Right    LIVER ULTRASOUND  12/11/2012   Procedure: LIVER ULTRASOUND;  Surgeon: Bernarda Ned, MD;  Location: [facility];  Service: General;  Laterality: Right;  Intraoperative Liver Ultrasound    Home Medications    Prior to Admission medications   Medication Sig Start Date End Date Taking? Authorizing Provider  acetaminophen  (TYLENOL ) 500 MG tablet Take 500 mg by mouth every 6 (six) hours as needed.   Yes [provider]  allopurinol  (ZYLOPRIM ) 100 MG tablet Take 100 mg by mouth daily. 09/29/21  Yes [provider]  amLODipine  (NORVASC ) 10 MG tablet Take 1 tablet (10 mg total) by mouth daily with lunch. 03/18/22  Yes Sebastian Toribio GAILS, MD  apixaban  (ELIQUIS ) 5 MG TABS tablet Take 1 tablet (5 mg total) by mouth 2 (two) times daily. **Restart on 08/08/2022** 04/11/23  Yes Patwardhan, Manish J, MD  atorvastatin  (LIPITOR) 40 MG tablet Take 1 tablet (40 mg total) by mouth daily. 04/11/23  Yes Patwardhan, Manish J, MD  empagliflozin (JARDIANCE) 25 MG TABS tablet Take 12.5 mg by mouth daily. 01/30/23  Yes [provider]  gabapentin  (NEURONTIN ) 300 MG capsule Take 1 capsule (300 mg total) by mouth 4 (four) times daily. 12/13/23 01/12/24 Yes  Joesph Shaver Scales, PA-C  metoprolol  succinate (TOPROL -XL) 50 MG 24 hr tablet Take 1 tablet (50 mg total) by mouth daily. 04/11/23  Yes Patwardhan, Manish J, MD  bisacodyl  (DULCOLAX) 5 MG EC tablet Take 10 mg by mouth daily as needed for moderate constipation.    [provider]  busPIRone (BUSPAR) 10 MG tablet Take by mouth. 11/07/23   [provider]  diclofenac Sodium (VOLTAREN) 1 % GEL Apply 1 Application topically 4 (four) times daily as needed (pain).    [provider]  docusate sodium  (COLACE) 100 MG capsule Take 1 capsule (100 mg total) by mouth 2 (two) times daily. 08/04/22   Bergman, Meghan D, NP  esomeprazole (NEXIUM) 20 MG capsule Take 20 mg by mouth daily as needed (acid reflux).    [provider]  fluticasone  (FLONASE ) 50 MCG/ACT nasal spray Place 2 sprays into both nostrils daily as needed for allergies. 03/28/22   [provider]  ONE TOUCH ULTRA TEST test strip USE 1 STRIP TO CHECK GLUCOSE TWICE DAILY 12/31/18   [provider]    Family History Family History  Problem Relation Age of Onset   Diabetes Mother    Stroke Mother    Huntington's disease Father    Hypertension Brother    Social History Social History   Tobacco Use   Smoking status: Every Day    Current packs/day: 0.50    Average packs/day: 0.5 packs/day for 30.0 years (15.0 ttl pk-yrs)    Types: Cigarettes   Smokeless tobacco: Never  Vaping Use   Vaping status: Never Used  Substance Use Topics   Alcohol use: Not Currently    Alcohol/week: 5.0 standard drinks of alcohol    Types: 5 Cans of beer per week   Drug use: No   Allergies   Metformin and related  Review of Systems Review of Systems Pertinent findings revealed after performing a 14 point review of systems has been noted in the history of present illness.  Physical Exam Vital Signs BP 124/82 (BP Location: Right Arm)   Pulse 77   Temp 98.6 F (37 C) (Oral)   Resp 18   SpO2 93%   No  data found.  Physical Exam Vitals and nursing note reviewed.  Constitutional:      General: He is not in acute distress.    Appearance: Normal appearance. He is normal weight. He is not ill-appearing.  HENT:     Head: Normocephalic and atraumatic.  Eyes:     Extraocular Movements: Extraocular movements intact.     Conjunctiva/sclera: Conjunctivae normal.     Pupils: Pupils are equal, round, and reactive to light.  Cardiovascular:     Rate and Rhythm: Normal rate and regular rhythm.  Pulmonary:     Effort: Pulmonary effort is normal.  Breath sounds: Normal breath sounds.  Musculoskeletal:        General: Normal range of motion.     Cervical back: Normal range of motion and neck supple.     Right foot: Normal range of motion. Deformity (Hammertoe) present. No foot drop or prominent metatarsal heads.       Feet:  Skin:    General: Skin is warm and dry.  Neurological:     General: No focal deficit present.     Mental Status: He is alert and oriented to person, place, and time. Mental status is at baseline.  Psychiatric:        Mood and Affect: Mood normal.        Behavior: Behavior normal.        Thought Content: Thought content normal.        Judgment: Judgment normal.     Visual Acuity Right Eye Distance:   Left Eye Distance:   Bilateral Distance:    Right Eye Near:   Left Eye Near:    Bilateral Near:     UC Couse / Diagnostics / Procedures:     Radiology DG Foot Complete Right Result Date: 12/13/2023 CLINICAL DATA:  Right foot pain for the past 2 days. EXAM: RIGHT FOOT COMPLETE - 3+ VIEW COMPARISON:  Right toe radiographs-06/28/2011 FINDINGS: Mild deformity involving the distal aspect of the proximal phalanx of the great toe as a sequela of comminuted fracture demonstrated on remote right great toe radiographs performed in 2012. No fracture or dislocation. Moderate degenerative change of the first MTP joint with joint space loss, subchondral sclerosis and  osteophytosis. No significant hallux valgus deformity. Hammertoe deformity noted affecting the fifth digit. Note is made of an os trigonum. No significant plantar calcaneal spur. Enthesopathic change involving the Achilles tendon insertion site. Distal vascular calcifications. Regional soft tissues appear otherwise normal. No radiopaque foreign body. IMPRESSION: 1. No acute findings. 2. Old/healed fracture with residual deformity involving the distal aspect of the proximal phalanx of the great toe. 3. Moderate degenerative change of the first MTP joint without associated hallux valgus deformity. 4. Enthesopathic change involving the Achilles tendon insertion site. Electronically Signed   By: Norleen Roulette M.D.   On: 12/13/2023 14:57    Procedures Procedures (including critical care time) EKG  Pending results:  Labs Reviewed  URIC ACID    Medications Ordered in UC: Medications - No data to display  UC Diagnoses / Final Clinical Impressions(s)   I have reviewed the triage vital signs and the nursing notes.  Pertinent labs & imaging results that were available during my care of the patient were reviewed by me and considered in my medical decision making (see chart for details).    Final diagnoses:  Right foot pain  Hallux valgus (acquired), right foot  History of fracture of phalanx of toe  Mononeuropathy   X-ray of foot revealed history of toe fracture great toe and significant hammertoe.  Suspect patient's burning sensation in his foot is related to degenerative arthritis as a result of these 2 issues which is probably caused him nerve pain.  Patient advised to try gabapentin  300 to milligrams 4 times daily, can take 2 at bedtime if needed.  Please see discharge instructions below for details of plan of care as provided to patient. ED Prescriptions     Medication Sig Dispense Auth. Provider   gabapentin  (NEURONTIN ) 300 MG capsule Take 1 capsule (300 mg total) by mouth 4 (four) times  daily. 120  capsule Joesph Shaver Scales, PA-C      PDMP not reviewed this encounter.  Pending results:  Labs Reviewed  URIC ACID      Discharge Instructions      The x-ray of your foot did not show any acute fracture but it did show that you have broken your right great toe in the past.  Because of this history of fracture as well as some degenerative arthritis, it is possible that the your foot is a nerve pain.  Either way, gabapentin  will be helpful in lessening this pain and helping your foot feel better.  Please take 1 tablet 3 times daily.  If you find that your pain is worse at night, you are welcome to take 2 tablets at bedtime.  Please be sure that you follow-up with your primary care provider for further evaluation and to discuss possible referral to podiatry.  Orthotic shoe inserts may be of benefit for you.  Thank you for visiting [facility] today.    Disposition Upon Discharge:  Condition: stable for discharge home  Patient presented with an acute illness with associated systemic symptoms and significant discomfort requiring urgent management. In my opinion, this is a condition that a prudent lay person (someone who possesses an average knowledge of health and medicine) may potentially expect to result in complications if not addressed urgently such as respiratory distress, impairment of bodily function or dysfunction of bodily organs.   Routine symptom specific, illness specific and/or disease specific instructions were discussed with the patient and/or caregiver at length.   As such, the patient has been evaluated and assessed, work-up was performed and treatment was provided in alignment with urgent care protocols and evidence based medicine.  Patient/parent/caregiver has been advised that the patient may require follow up for further testing and treatment if the symptoms continue in spite of treatment, as clinically indicated and  appropriate.  Patient/parent/caregiver has been advised to return to the [facility] or PCP if no better; to PCP or the Emergency Department if new signs and symptoms develop, or if the current signs or symptoms continue to change or worsen for further workup, evaluation and treatment as clinically indicated and appropriate  The patient will follow up with their current PCP if and as advised. If the patient does not currently have a PCP we will assist them in obtaining one.   The patient may need specialty follow up if the symptoms continue, in spite of conservative treatment and management, for further workup, evaluation, consultation and treatment as clinically indicated and appropriate.  Patient/parent/caregiver verbalized understanding and agreement of plan as discussed.  All questions were addressed during visit.  Please see discharge instructions below for further details of plan.  This office note has been dictated using Teaching laboratory technician.  Unfortunately, this method of dictation can sometimes lead to typographical or grammatical errors.  I apologize for your inconvenience in advance if this occurs.  Please do not hesitate to reach out to me if clarification is needed.      Joesph Shaver Scales, [place] 12/13/23 [phone]

## 2023-12-19 NOTE — Telephone Encounter (Signed)
Anti coag clearance Letter faxed and emailed to [place] at [facility]

## 2023-12-23 NOTE — Telephone Encounter (Signed)
Called [facility] at [phone] .  He is out of the office until Thursday of this week.

## 2023-12-26 NOTE — Telephone Encounter (Signed)
Called and spoke to [place].  Emailed him the form again.  He confirmed receipt and sent to provider for reply

## 2023-12-31 NOTE — Telephone Encounter (Signed)
Emailed [place] with a request for update on status of request

## 2024-01-01 ENCOUNTER — Other Ambulatory Visit: Payer: Self-pay

## 2024-01-01 MED ORDER — NA SULFATE-K SULFATE-MG SULF 17.5-3.13-1.6 GM/177ML PO SOLN: 1.0000 | Freq: Once | ORAL | 0 refills | Status: AC

## 2024-01-01 NOTE — Telephone Encounter (Signed)
Jan can you work on this. Levander Campion was doing the scheduling for the procedure but you were with [facility] when they requested the procedure that day

## 2024-01-01 NOTE — Telephone Encounter (Signed)
Ok thank you for working on it

## 2024-01-01 NOTE — Telephone Encounter (Signed)
[facility] Left message to cancel script- we will send to his local pharmacy

## 2024-01-01 NOTE — Telephone Encounter (Signed)
Generic suprep sent to local pharmacy - [facility] -  [address]

## 2024-01-02 NOTE — Telephone Encounter (Signed)
Called pharmacy but they are closed until 2pm

## 2024-01-02 NOTE — Telephone Encounter (Signed)
Called pharmacy. With insurance it was over $70. With [facility] it was about the same at [facility] however, he could get it for $35 at [facility]. Called patient and LM to see if there is a [facility] near him I can send the script to. [facility] codes for [facility]:  BIN: [id number] PCN: [id number] Group: [id number] Member ID: [id number]

## 2024-01-06 ENCOUNTER — Telehealth: Payer: Self-pay | Admitting: Gastroenterology

## 2024-01-06 MED ORDER — SUFLAVE 178.7 G PO SOLR: 1.0000 | Freq: Once | ORAL | Status: AC

## 2024-01-06 NOTE — Telephone Encounter (Signed)
Called and left detailed message to disregard script sent for patient's colonoscopy. We are providing him a free sample

## 2024-01-06 NOTE — Addendum Note (Signed)
Addended by: Cooper Render on: 01/06/2024 11:41 AM   Modules accepted: Orders

## 2024-01-06 NOTE — Telephone Encounter (Signed)
Inbound call from [facility] requesting a call to discuss suprep further. Call back number is [phone] ext [phone]. Please advise, thank you.

## 2024-01-06 NOTE — Telephone Encounter (Signed)
Called and spoke to patient.  He can not afford the [employer].  Offered him a free sample of Suflave.  Patient understands that he will need to come by the office to get sample and new instructions.   Called and LM for patient with address and office hours. Sample and instructions will be waiting on 3rd floor

## 2024-01-06 NOTE — Addendum Note (Signed)
Addended by: Cooper Render on: 01/06/2024 11:12 AM   Modules accepted: Orders

## 2024-01-07 ENCOUNTER — Telehealth: Payer: Self-pay | Admitting: *Deleted

## 2024-01-07 DIAGNOSIS — F419 Anxiety disorder, unspecified: Secondary | ICD-10-CM | POA: Insufficient documentation

## 2024-01-07 DIAGNOSIS — I4891 Unspecified atrial fibrillation: Secondary | ICD-10-CM | POA: Insufficient documentation

## 2024-01-07 DIAGNOSIS — Z85038 Personal history of other malignant neoplasm of large intestine: Secondary | ICD-10-CM | POA: Insufficient documentation

## 2024-01-07 DIAGNOSIS — F4312 Post-traumatic stress disorder, chronic: Secondary | ICD-10-CM | POA: Insufficient documentation

## 2024-01-07 NOTE — Telephone Encounter (Signed)
 Patient with diagnosis of A Fib on Eliquis  for anticoagulation.    Procedure: EGD and Colonoscopy  Date of procedure: 01/21/24   CHA2DS2-VASc Score = 5  This indicates a 7.2% annual risk of stroke. The patient's score is based upon: CHF History: 0 HTN History: 1 Diabetes History: 1 Stroke History: 2 Vascular Disease History: 0 Age Score: 1 Gender Score: 0   CrCl 135 ml/min Platelet count Overdue  Due to history of CVA, will route to Dr Ritchie for input if 2 day Eliquis  hold is acceptable    **This guidance is not considered finalized until pre-operative APP has relayed final recommendations.**

## 2024-01-07 NOTE — Telephone Encounter (Signed)
CALLED AND SPOKE TO Gary Frederick AT [place]. He has the request and it was sent it to PCP but will resend to the PCP again and also send to Cardiology.

## 2024-01-07 NOTE — Telephone Encounter (Signed)
If possible, continue Eliquis periprocedurally. If this is not possible, limit to holding it for 1 day.  Thanks MJP

## 2024-01-07 NOTE — Telephone Encounter (Signed)
   Pre-operative Risk Assessment    Patient Name: Gary Frederick  DOB: 03-Sep-1956 MRN: [id number]   Date of last office visit: 10/16/2023 Date of next office visit: None   Request for Surgical Clearance    Procedure:   EGD and Colonoscopy  Date of Surgery:  Clearance 01/21/24                                 Surgeon:  Dr. San Surgeon's Group or Practice Name:  [facility] Phone number:  [phone] Fax number:  [phone]   Type of Clearance Requested:   - Medical  - Pharmacy:  Hold Apixaban  (Eliquis ) 2 days   Type of Anesthesia:  Not Indicated   Additional requests/questions:    Signed, Edsel Grayce Sanders   01/07/2024, 11:45 AM

## 2024-01-07 NOTE — Telephone Encounter (Signed)
Left message to call back about preop appt. There are no open tele visits in time for procedure. Per Edd Fabian, FNP ok to schedule in office as to not delay procedure.

## 2024-01-07 NOTE — Telephone Encounter (Signed)
   Name: Gary Frederick  DOB: January 02, 1956  MRN: [id number]  Primary Cardiologist: None   Preoperative team, please contact this patient and set up a phone call appointment for further preoperative risk assessment. Please obtain consent and complete medication review. Thank you for your help.  I confirm that guidance regarding antiplatelet and oral anticoagulation therapy has been completed and, if necessary, noted below.  Patient with diagnosis of A Fib on Eliquis  for anticoagulation.     Procedure: EGD and Colonoscopy  Date of procedure: 01/21/24     CHA2DS2-VASc Score = 5  This indicates a 7.2% annual risk of stroke. The patient's score is based upon: CHF History: 0 HTN History: 1 Diabetes History: 1 Stroke History: 2 Vascular Disease History: 0 Age Score: 1 Gender Score: 0     CrCl 135 ml/min Platelet count Overdue   Due to history of CVA, if possible, continue Eliquis  periprocedurally. If this is not possible, limit to holding it for 1 day.  (Dr. El[place]ira)  I also confirmed the patient resides in the state of [place] . As per [facility] Medical Board telemedicine laws, the patient must reside in the state in which the provider is licensed.   Josefa CHRISTELLA Beauvais, NP 01/07/2024, 4:16 PM [facility]

## 2024-01-08 NOTE — Telephone Encounter (Signed)
 Received fax from Dr. Felisa at the [facility]:  [name] may hold Eliquis  for 2 days (up to 3 days) prior to his procedure.  This is signed by Dr. Rudene SQUIBB. Paruchuri, [facility].  Called the patient and advised him to hold his Eliquis  on the 16th and 17th.  We also discussed that he will hold his oral diabetic med, Jardiance, the morning of the procedure but that he can take his other medication (blood pressure, cholesterol...) no later than 3 hours before his procedure which is scheduled for 1:30pm.  He will take necessary meds prior to 10:30am.

## 2024-01-08 NOTE — Telephone Encounter (Signed)
 Received a call from the [facility], PCP's nurse Lonell.  Veteran was seen yesterday and let them know he needs clearance to hold Eliquis  for colonoscopy procedure with Dr. San this month.  She indicates that they have not rec'd a request.  She asked that letter be emailed to her directly at [email or web address].  Letter was emailed.  Furthermore, Edsel Sanders at [facility] sent request to pool for advise on holding Eliquis  for procedure.  Dr. Elmira responded :  If possible, continue Eliquis  periprocedurally. If this is not possible, limit to holding it for 1 day.   Thanks MJP  Will await response from PCP at the [facility]: PARUCHURI, VAMSEE

## 2024-01-08 NOTE — Telephone Encounter (Signed)
Procedure is 2-18 with Dr. Barron Alvine.  Patient would need to start holding Eliquis on 2-13 IF cleared by the [place]...Marland KitchenMarland Kitchen

## 2024-01-08 NOTE — Telephone Encounter (Signed)
 I s/w the pt and he has been scheduled in office appt for preop as no open slots for televisit. Ok per Katlyn West, NP and Jackee Alberts, NP.   Pt to see delon Holts, PAC at [facility] office 01/15/24 @ 10:55. Pt aware of appt location.   I will update all parties involved.

## 2024-01-09 NOTE — Telephone Encounter (Signed)
 Patient has been cleared to hold Eliquis  for 2 days prior to procedure by patient's PCP at the [facility].  Patient has been informed to hold 2-16 and 2-17.

## 2024-01-14 NOTE — Progress Notes (Unsigned)
Cardiology Office Note   Date:  01/15/2024  ID:  Gary Frederick, DOB [birth year], MRN [id number] PCP:  Clinic, [facility] Cardiologist: Elder Negus, MD  Reason for visit: Preop clearance Procedure: EGD and Colonoscopy  Date of procedure: 01/21/24 Surgeon:  Dr. Barron Alvine Surgeon's Group or Practice Name:  [facility] Phone number:  [phone] Fax number:  [phone]  History of Present Illness    Gary Frederick is a 68 y.o. male with a hx of hypertension, hyperlipidemia, type 2 diabetes mellitus, h/o stroke with minimal residual neurodeficit, PAF, smoker, h/o alcohol abuse, cannabis use, gout, colon cancer in remission.  2D echo April 2023 showed EF 60 to 65%, no significant valve disease.  6-day event monitor May 2023 showed normal sinus rhythm, rare PACs and PVCs.  14 episodes of SVT versus atrial tachycardia with longest lasting 15 beats, no A-fib.     Patient last saw Dr. Rosemary Holms in 10/2023.  Patient was doing well without cardiac symptoms.  Continued on Eliquis and Lipitor.  Follow-up 1 year.  Today, patient states he is doing well.  He denies chest pain, shortness of breath, lightheadedness.  He has occasional palpitations at night when laying down, they resolved with position change.  He states prior LE edema, resolved with leg elevation.  He continues to smoke 1 pack/day.  He is having this EGD and colonoscopy as routine surveillance, he previously had colon cancer.  He states he is not interested in a health coach referral to help with tobacco cessation at this time.   Objective / Physical Exam   EKG today: Normal sinus rhythm with first-degree AV block, left axis deviation, T wave abnormality that is unchanged from previous EKGs.  Vital signs:  BP 118/78 (BP Location: Left Arm, Patient Position: Sitting)   Pulse 66   Ht 6\' 3"  (1.905 m)   Wt 254 lb 6.4 oz (115.4 kg)   SpO2 95%   BMI 31.80 kg/m     GEN: No acute  distress NECK: No carotid bruits CARDIAC: RRR, no murmurs RESPIRATORY:  Clear to auscultation without rales, wheezing or rhonchi  EXTREMITIES: No edema  Assessment and Plan   Preop clearance for EGD and Colonoscopy  :23 Mr. Rusk's perioperative risk of a major cardiac event is 0.9% according to the Revised Cardiac Risk Index (RCRI).  Therefore, he is at low risk for perioperative complications.   His functional capacity is good at 5.07 METs according to the Duke Activity Status Index (DASI). Recommendations: According to ACC/AHA guidelines, no further cardiovascular testing needed.  The patient may proceed to surgery at acceptable risk.   Antiplatelet and/or Anticoagulation Recommendations: -CHA2DS2-VASc Score = 5  -Due to history of CVA, if possible, continue Eliquis periprocedurally. If this is not possible, limit to holding it for 1 day.  (Dr. Rosemary Holms)  Paroxysmal atrial fibrillation -Normal sinus rhythm today, heart rate 66.  Continue Toprol XL 50 mg once daily. -Continue Eliquis for stroke prevention.  Hypertension, well-controlled -Continue amlodipine 10 mg daily. -Goal BP is <130/80.  Recommend DASH diet (high in vegetables, fruits, low-fat dairy products, whole grains, poultry, fish, and nuts and low in sweets, sugar-sweetened beverages, and red meats), salt restriction and increase physical activity.  Hyperlipidemia with goal LDL less than 70 -LDL 79 in November 2024.  Continue Lipitor 40 mg daily.  Patient plans to increase his exercise. -Recommend cholesterol lowering diets - Mediterranean diet, DASH diet, vegetarian diet, low-carbohydrate diet and avoidance of trans fats.  Discussed healthier choice substitutes.  Nuts, high-fiber foods, and fiber supplements may also improve lipids.    Obesity -Even a 5-10% weight loss can have cardiovascular benefits.   -Recommend moderate intensity activity for 30 minutes 5 days/week and the DASH diet. -Given a patient education  sheet on healthy lifestyle.  Tobacco use  -Recommend tobacco cessation.  Reviewed physiologic effects of nicotine and the immediate-eventual benefits of quitting including improvement in cough/breathing and reduction in cardiovascular events.  Discussed quitting tips such as removing triggers and getting support from family/friends and Quitline [place]. -Patient declined referral to our health coach to help with tobacco sensation. -USPSTF recommends one-time screening for abdominal aortic aneurysm (AAA) by ultrasound in men 40 -15 years old who have ever smoked.  --> Will order today.  Disposition - Follow-up in November 2025 for his annual follow-up with Dr. Rosemary Holms.   Signed, Cannon Kettle, PA-C  01/15/2024 [facility]

## 2024-01-15 ENCOUNTER — Encounter: Payer: Self-pay | Admitting: Physician Assistant

## 2024-01-15 ENCOUNTER — Ambulatory Visit: Payer: Non-veteran care | Attending: Physician Assistant | Admitting: Physician Assistant

## 2024-01-15 VITALS — BP 118/78 | HR 66 | Ht 75.0 in | Wt 254.4 lb

## 2024-01-15 DIAGNOSIS — E782 Mixed hyperlipidemia: Secondary | ICD-10-CM

## 2024-01-15 DIAGNOSIS — I10 Essential (primary) hypertension: Secondary | ICD-10-CM | POA: Diagnosis not present

## 2024-01-15 DIAGNOSIS — I480 Paroxysmal atrial fibrillation: Secondary | ICD-10-CM | POA: Diagnosis not present

## 2024-01-15 DIAGNOSIS — Z01818 Encounter for other preprocedural examination: Secondary | ICD-10-CM | POA: Diagnosis not present

## 2024-01-15 NOTE — Patient Instructions (Addendum)
Medication Instructions:  No changes *If you need a refill on your cardiac medications before your next appointment, please call your pharmacy*   Lab Work: No labs If you have labs (blood work) drawn today and your tests are completely normal, you will receive your results only by: MyChart Message (if you have MyChart) OR A paper copy in the mail If you have any lab test that is abnormal or we need to change your treatment, we will call you to review the results.   Testing/Procedures: [address], [address] Your physician has requested that you have an abdominal aorta duplex. During this test, an ultrasound is used to evaluate the aorta. Allow 30 minutes for this exam. Do not eat after midnight the day before and avoid carbonated beverages.  Please note: We ask at that you not bring children with you during ultrasound (echo/ vascular) testing. Due to room size and safety concerns, children are not allowed in the ultrasound rooms during exams. Our front office staff cannot provide observation of children in our lobby area while testing is being conducted. An adult accompanying a patient to their appointment will only be allowed in the ultrasound room at the discretion of the ultrasound technician under special circumstances. We apologize for any inconvenience.    Follow-Up: At [facility], you and your health needs are our priority.  As part of our continuing mission to provide you with exceptional heart care, we have created designated Provider Care Teams.  These Care Teams include your primary Cardiologist (physician) and Advanced Practice Providers (APPs -  Physician Assistants and Nurse Practitioners) who all work together to provide you with the care you need, when you need it.  We recommend signing up for the patient portal called "MyChart".  Sign up information is provided on this After Visit Summary.  MyChart is used to connect with patients for Virtual Visits  (Telemedicine).  Patients are able to view lab/test results, encounter notes, upcoming appointments, etc.  Non-urgent messages can be sent to your provider as well.   To learn more about what you can do with MyChart, go to [email or web address].    Your next appointment:   9 month(s)  Provider:   Truett Mainland, MD  Other Instructions Hold Eliquis beginning February 17 th - February 18 th AM. Restart after procedure.

## 2024-01-20 ENCOUNTER — Telehealth: Payer: Self-pay | Admitting: *Deleted

## 2024-01-20 NOTE — Telephone Encounter (Signed)
Patient called and stated he's been eating nuts everyday.  Dr. Santa Lighter is aware and instructed patient to take 10 mg Dulcolax this morning and this afternoon at 4:00pm prior to stating prep.  Patient verbalized understanding and no further questions.

## 2024-01-21 ENCOUNTER — Encounter: Payer: No Typology Code available for payment source | Admitting: Gastroenterology

## 2024-01-21 ENCOUNTER — Telehealth: Payer: Self-pay | Admitting: Gastroenterology

## 2024-01-21 NOTE — Telephone Encounter (Signed)
Good morning Dr. Barron Alvine,   Patient called stating that he would not be in for his 1:30 colonoscopy and endoscopy. Patient states that his blood sugar dropped really low yesterday and he did not feel well, so he had to eat. I asked if he wanted to reschedule and he stated that he was not going to put himself through that again.

## 2024-01-21 NOTE — Telephone Encounter (Signed)
I just spoke with this patient and he stated that he had an oatmeal cake and banana around 4 am when his blood sugar dropped. Patient also stated that he felt like "Afib was acting up". He stated his heart was racing and he started to have a headache on the left side of his head. He was very worried about these symptoms and took his Eliquis around 8 am. I am thinking this would disqualify him for an EGD today. Your thoughts?  9:11 AM VC Vito Cirigliano V, DO Agreed. Probably best for him to follow-up with his [facility]. Can schedule f/u appt routine in the office and discuss if/when he would like to proceed with endo procedures. Thanks for calling him and the update!   Patient advised that he should follow up with his primary care physician and cardiologist in regards to the symptoms he has had.  Patient stated that he will follow up with cardiologist and then contact our office follow up appointment to discuss if and when he would like to proceed with endo procedures.  Patient agreed to plan and verbalized understanding.  No further questions.

## 2024-01-21 NOTE — Telephone Encounter (Signed)
Dr. Barron Alvine,    Patient stated he wanted to cancel both, he was too weak to proceed.

## 2024-01-27 NOTE — Telephone Encounter (Signed)
 PT rescheduled procedure. Only wants to do an EGD now. Needs to have updated instructions. Please advise.

## 2024-01-28 NOTE — Telephone Encounter (Signed)
 I spoke with Shonpryl and made her aware that the patient should follow up with his PCP and cardiologist prior to scheduling an EGD. Shonpryl spoke with patient.  Patient should contact our office after seeing these providers.

## 2024-02-13 ENCOUNTER — Ambulatory Visit (HOSPITAL_COMMUNITY)
Admission: RE | Admit: 2024-02-13 | Discharge: 2024-02-13 | Disposition: A | Discharge status: Home / Self Care | Payer: Medicare Other | Source: Ambulatory Visit | Attending: Cardiology | Admitting: Cardiology

## 2024-02-13 DIAGNOSIS — Z136 Encounter for screening for cardiovascular disorders: Secondary | ICD-10-CM | POA: Insufficient documentation

## 2024-02-13 DIAGNOSIS — Z01818 Encounter for other preprocedural examination: Secondary | ICD-10-CM | POA: Diagnosis present

## 2024-02-13 DIAGNOSIS — Z87891 Personal history of nicotine dependence: Secondary | ICD-10-CM | POA: Diagnosis not present

## 2024-02-13 DIAGNOSIS — E782 Mixed hyperlipidemia: Secondary | ICD-10-CM | POA: Diagnosis present

## 2024-02-13 DIAGNOSIS — I10 Essential (primary) hypertension: Secondary | ICD-10-CM | POA: Diagnosis present

## 2024-02-13 DIAGNOSIS — I480 Paroxysmal atrial fibrillation: Secondary | ICD-10-CM | POA: Insufficient documentation

## 2024-02-24 ENCOUNTER — Telehealth: Payer: Self-pay

## 2024-02-24 NOTE — Telephone Encounter (Addendum)
 Called patient regarding results. Patient had understanding of results.----- Message from Cannon Kettle sent at 02/19/2024 10:17 AM EDT ----- Mild abnormal dilatation of the proximal and mid abdominal aorta.  Recommendations: -At higher risk for AAA given smoking history.  Recommend tobacco cessation.  There are immediate-eventual benefits of quitting including improvement in cough/breathing and reduction in cardiovascular events.  Recommend removing triggers and getting support from family/friends and Quitline Patrick.

## 2024-02-28 ENCOUNTER — Telehealth: Payer: Self-pay | Admitting: Gastroenterology

## 2024-02-28 NOTE — Telephone Encounter (Signed)
 Inbound call from patient requesting to reschedule 4/2 EGD. Advised patient it looks like he would have to follow up with his PCP and cardiologist prior to rescheduling procedure due to 2/18 telephone encounter. Asked patient has he followed up with pcp and cardiologist patient stated yes, but does not remember when. Asked patient was it in the month of March but he stated it was sometime in January.  Requesting a call back to discuss further. Please advise, thank you.

## 2024-02-28 NOTE — Telephone Encounter (Signed)
 Left message for patient to call back

## 2024-03-02 NOTE — Telephone Encounter (Signed)
 Called and spoke with patient. Patient has been evaluated by cardiology and would like to proceed with rescheduling endoscopic procedures. Per 2/18 telephone encounter patient would need an updated OV at our office before rescheduling due to patient's hx. I advised patient that his procedures may need to be completed in a hospital based setting but that would be up to the providers. Patient states that he "really needs the procedures done". I scheduled patient for a follow up with Quentin Mulling, PA-C on Thursday, 03/12/24 at 1:30 pm. Patient is aware that timing of procedures will be discussed at that time. Pt verbalized understanding and had no concerns at the end of the call.

## 2024-03-04 ENCOUNTER — Encounter: Payer: Medicare Other | Admitting: Gastroenterology

## 2024-03-12 ENCOUNTER — Telehealth: Payer: Self-pay | Admitting: Physician Assistant

## 2024-03-12 ENCOUNTER — Telehealth: Payer: Self-pay

## 2024-03-12 ENCOUNTER — Ambulatory Visit: Admitting: Physician Assistant

## 2024-03-12 ENCOUNTER — Other Ambulatory Visit: Payer: Self-pay

## 2024-03-12 ENCOUNTER — Encounter: Payer: Self-pay | Admitting: Physician Assistant

## 2024-03-12 VITALS — BP 126/70 | HR 74 | Ht 75.0 in | Wt 248.0 lb

## 2024-03-12 DIAGNOSIS — Z85038 Personal history of other malignant neoplasm of large intestine: Secondary | ICD-10-CM

## 2024-03-12 DIAGNOSIS — E1159 Type 2 diabetes mellitus with other circulatory complications: Secondary | ICD-10-CM

## 2024-03-12 DIAGNOSIS — K5904 Chronic idiopathic constipation: Secondary | ICD-10-CM | POA: Diagnosis not present

## 2024-03-12 DIAGNOSIS — K219 Gastro-esophageal reflux disease without esophagitis: Secondary | ICD-10-CM

## 2024-03-12 DIAGNOSIS — Z7901 Long term (current) use of anticoagulants: Secondary | ICD-10-CM

## 2024-03-12 DIAGNOSIS — R1310 Dysphagia, unspecified: Secondary | ICD-10-CM | POA: Diagnosis not present

## 2024-03-12 DIAGNOSIS — I480 Paroxysmal atrial fibrillation: Secondary | ICD-10-CM | POA: Diagnosis not present

## 2024-03-12 DIAGNOSIS — E119 Type 2 diabetes mellitus without complications: Secondary | ICD-10-CM

## 2024-03-12 DIAGNOSIS — Z8673 Personal history of transient ischemic attack (TIA), and cerebral infarction without residual deficits: Secondary | ICD-10-CM

## 2024-03-12 DIAGNOSIS — Z7984 Long term (current) use of oral hypoglycemic drugs: Secondary | ICD-10-CM

## 2024-03-12 DIAGNOSIS — I63411 Cerebral infarction due to embolism of right middle cerebral artery: Secondary | ICD-10-CM

## 2024-03-12 DIAGNOSIS — K5909 Other constipation: Secondary | ICD-10-CM

## 2024-03-12 MED ORDER — NA SULFATE-K SULFATE-MG SULF 17.5-3.13-1.6 GM/177ML PO SOLN: ORAL | 0 refills | Status: DC

## 2024-03-12 MED ORDER — PEG 3350-KCL-NA BICARB-NACL 420 G PO SOLR: 4000.0000 mL | Freq: Once | ORAL | 0 refills | Status: AC

## 2024-03-12 NOTE — Patient Instructions (Addendum)
 You have been scheduled for an endoscopy and colonoscopy. Please follow the written instructions given to you at your visit today.  If you use inhalers (even only as needed), please bring them with you on the day of your procedure.  DO NOT TAKE 7 DAYS PRIOR TO TEST- Trulicity (dulaglutide) Ozempic, Wegovy (semaglutide) Mounjaro (tirzepatide) Bydureon Bcise (exanatide extended release)  DO NOT TAKE 1 DAY PRIOR TO YOUR TEST Rybelsus (semaglutide) Adlyxin (lixisenatide) Victoza (liraglutide) Byetta (exanatide)  We have sent the following medications to your pharmacy for you to pick up at your convenience: Suprep  Cardiologist okay clearance was given however patient can hold Eliquis 1 day prior to the procedure     Linzess 72  *IBS-C patients may begin to experience relief from belly pain and overall abdominal symptoms (pain, discomfort, and bloating) in about 1 week,  with symptoms typically improving over 12 weeks.  Take at least 30 minutes before the first meal of the day on an empty stomach You can have a loose stool if you eat a high-fat breakfast. Give it at least 7 days, may have more bowel movements during that time.   The diarrhea should go away and you should start having normal, complete, full bowel movements.  It may be helpful to start treatment when you can be near the comfort of your own bathroom, such as a weekend.  After you are out we can send in a prescription if you did well, there is a prescription card  _______________________________________________________  If your blood pressure at your visit was 140/90 or greater, please contact your primary care physician to follow up on this.  _______________________________________________________  If you are age 45 or older, your body mass index should be between 23-30. Your Body mass index is 31 kg/m. If this is out of the aforementioned range listed, please consider follow up with your Primary Care Provider.  If  you are age 34 or younger, your body mass index should be between 19-25. Your Body mass index is 31 kg/m. If this is out of the aformentioned range listed, please consider follow up with your Primary Care Provider.   ________________________________________________________  The [facility] providers would like to encourage you to use [facility] to communicate with providers for non-urgent requests or questions.  Due to long hold times on the telephone, sending your provider a message by [facility] may be a faster and more efficient way to get a response.  Please allow 48 business hours for a response.  Please remember that this is for non-urgent requests.  _______________________________________________________  Due to recent changes in healthcare laws, you may see the results of your imaging and laboratory studies on MyChart before your provider has had a chance to review them.  We understand that in some cases there may be results that are confusing or concerning to you. Not all laboratory results come back in the same time frame and the provider may be waiting for multiple results in order to interpret others.  Please give [place] 48 hours in order for your provider to thoroughly review all the results before contacting the office for clarification of your results.   Thank you for entrusting me with your care and choosing [facility].  Quentin Mulling PA-C

## 2024-03-12 NOTE — Telephone Encounter (Signed)
 Inbound call from [facility] pharmacy stating the prep medication that was called into pharmacy for patient's colonoscopy is not in their formulary. Requesting for golytely or movi prep to be called in instead. Please advise, thank you.

## 2024-03-12 NOTE — Telephone Encounter (Signed)
 Received message via [facility] only approve Golytely called patient to advise him that new instructions was mailed to patient and prep was sent to [facility] pharmacy

## 2024-03-12 NOTE — Progress Notes (Signed)
 03/12/2024 Chipper Herb [id number] 11-Nov-1956  Referring provider: [facility] Primary GI doctor: Dr. Barron Alvine  ASSESSMENT AND PLAN:   Personal history of colon cancer  status post right sided colectomy 2014, no radiation chemo Has followed with the [facility] Colonoscopy August 2020 sessile polyps rectum rectosigmoid colon descending transverse 2 to 4 mm normal anastomosis recall 3 years We have discussed the risks of bleeding, infection, perforation, medication reactions, and remote risk of death associated with colonoscopy. All questions were answered and the patient acknowledges these risk and wishes to proceed.  GERD rare dysphagia Well-controlled on Nexium  Will schedule for EGD for evaluation I discussed risks of EGD with patient today, including risk of sedation, bleeding or perforation.  Patient provides understanding and gave verbal consent to proceed.  CIC Has failed MiraLAX, senokot Dulcolax liquid, once a week - Increase fiber/ water intake, decrease caffeine, increase activity level. -Will add on Linzess 145 -possible component of pelvis floor dysfunction with history and symptoms.   History of CVA  April 2023 with residual neurodeficit  PAF 2023 echocardiogram EF 60 to 65% trivial MR otherwise normal valves On Eliquis January 15, 2024 saw cardiologist for clearance which was given however patient had 1 day hold of Eliquis recommended with CHA2DS2-VASc 5 and recent CVA.  Type 2 diabetes On jardiance ,was on that last time He states last time before the colon/EGD he had low sugars and having HA with sugar at 69/70 so he did not have the procedures.  Discussed diet before and he feels he can continue, long discussion  Patient Care Team: [facility] as PCP - General Patwardhan, Anabel Bene, MD as PCP - Cardiology (Cardiology) Romie Levee, MD as Attending Physician (General Surgery) Jeani Hawking, MD as Attending  Physician (Gastroenterology)  HISTORY OF PRESENT ILLNESS: 68 y.o. male with a past medical history listed below presents for evaluation of history of colon cancer/GERD.   Patient was seen in the office by Cira Servant, PA in December and was scheduled for EGD colonoscopy however patient was not feeling well possible low sugar versus atrial fibrillation had follow-up with primary care and cardiologist. January 15, 2024 saw cardiologist for clearance which was given however patient had 1 day hold of Eliquis recommended with CHA2DS2-VASc 5 and recent CVA.  Discussed the use of AI scribe software for clinical note transcription with the patient, who gave verbal consent to proceed.  History of Present Illness   STORY VANVRANKEN is a 68 year old male with colon cancer and diabetes who presents for evaluation of gastrointestinal symptoms and management of diabetes during procedures.  He has been experiencing reflux and heartburn, which are currently managed with Nexium. He wants to undergo an endoscopy for the past seven to eight months but has been unable to do so due to concerns about his blood sugar levels during fasting.  He is currently on Jardiance for diabetes management. His blood sugar drops significantly when he does not eat, with levels dropping to 80 mg/dL or lower, causing symptoms such as severe headaches. He monitors his blood sugar closely, especially during fasting periods required for procedures like colonoscopy. He recalls a previous incident where his blood sugar dropped significantly during a colonoscopy prep, but he was only drinking water, not anything with sugar.  He manages constipation with Dulcolax liquid, which he takes once or twice a week as needed. He has tried other medications in the past but finds Dulcolax to be the most  effective. No shortness of breath, chest pain, or abdominal pain.  He has a history of colon cancer diagnosed in 2014, for which he underwent a  right-sided colectomy. His last colonoscopy was in 2020.      He  reports that he has been smoking cigarettes. He has a 15 pack-year smoking history. He has never used smokeless tobacco. He reports that he does not currently use alcohol after a past usage of about 5.0 standard drinks of alcohol per week. He reports that he does not use drugs.  RELEVANT GI HISTORY, IMAGING AND LABS: Results   LABS Continuous blood glucose monitor: 93 mg/dL at night, 80 mg/dL at 2 AM, 40-98 mg/dL in the morning (11/91/4782)     Colonoscopy with Dr. Elnoria Howard August 2020 - 8 sessile polyps found in the rectum, rectosigmoid colon, descending colon, transverse colon.  These were 2 to 4 mm in size.  Removed with cold snare. ( No pathology available). - Evidence of prior functional end-to-end ileocolonic anastomosis in the transverse colon - Repeat 3 years   CBC    Component Value Date/Time   WBC 10.5 08/05/2022 1607   RBC 4.18 (L) 08/05/2022 1607   HGB 13.0 08/05/2022 1607   HCT 38.2 (L) 08/05/2022 1607   PLT 167 08/05/2022 1607   MCV 91.4 08/05/2022 1607   MCH 31.1 08/05/2022 1607   MCHC 34.0 08/05/2022 1607   RDW 12.8 08/05/2022 1607   LYMPHSABS 1.8 03/15/2022 1110   MONOABS 1.1 (H) 03/15/2022 1110   EOSABS 0.0 03/15/2022 1110   BASOSABS 0.0 03/15/2022 1110   No results for input(s): "HGB" in the last 8760 hours.  CMP     Component Value Date/Time   NA 138 08/05/2022 1607   K 3.7 08/05/2022 1607   CL 104 08/05/2022 1607   CO2 25 08/05/2022 1607   GLUCOSE 111 (H) 08/05/2022 1607   BUN 10 08/05/2022 1607   CREATININE 0.84 08/05/2022 1607   CREATININE 0.91 03/25/2013 1512   CALCIUM 8.4 (L) 08/05/2022 1607   PROT 6.7 08/05/2022 1607   ALBUMIN 3.5 08/05/2022 1607   AST 32 08/05/2022 1607   ALT 23 08/05/2022 1607   ALKPHOS 44 08/05/2022 1607   BILITOT 1.1 08/05/2022 1607   GFRNONAA >60 08/05/2022 1607   GFRAA >60 02/19/2020 0452      Latest Ref Rng & Units 08/05/2022    4:07 PM 03/15/2022    11:10 AM  Hepatic Function  Total Protein 6.5 - 8.1 g/dL 6.7  7.3   Albumin 3.5 - 5.0 g/dL 3.5  4.0   AST 15 - 41 U/L 32  31   ALT 0 - 44 U/L 23  34   Alk Phosphatase 38 - 126 U/L 44  43   Total Bilirubin 0.3 - 1.2 mg/dL 1.1  0.7       Current Medications:   Current Outpatient Medications (Endocrine & Metabolic):    empagliflozin (JARDIANCE) 25 MG TABS tablet, Take 12.5 mg by mouth daily.   empagliflozin (JARDIANCE) 25 MG TABS tablet, Take 25 mg by mouth daily.  Current Outpatient Medications (Cardiovascular):    amLODipine (NORVASC) 10 MG tablet, Take 1 tablet (10 mg total) by mouth daily with lunch.   atorvastatin (LIPITOR) 40 MG tablet, Take 1 tablet (40 mg total) by mouth daily.   doxazosin (CARDURA) 1 MG tablet, Take 1 tablet by mouth daily.   metoprolol succinate (TOPROL-XL) 50 MG 24 hr tablet, Take 1 tablet (50 mg total) by mouth daily.  Current Outpatient Medications (Respiratory):    fluticasone (FLONASE) 50 MCG/ACT nasal spray, Place 2 sprays into both nostrils daily as needed for allergies.  Current Outpatient Medications (Analgesics):    acetaminophen (TYLENOL) 500 MG tablet, Take 500 mg by mouth every 6 (six) hours as needed.   allopurinol (ZYLOPRIM) 100 MG tablet, Take 100 mg by mouth daily.  Current Outpatient Medications (Hematological):    apixaban (ELIQUIS) 5 MG TABS tablet, Take 1 tablet (5 mg total) by mouth 2 (two) times daily. **Restart on 08/08/2022**  Current Outpatient Medications (Other):    bisacodyl (DULCOLAX) 5 MG EC tablet, Take 10 mg by mouth daily as needed for moderate constipation.   busPIRone (BUSPAR) 10 MG tablet, Take by mouth.   clonazePAM (KLONOPIN) 0.5 MG tablet, Take 0.5 mg by mouth at bedtime.   diclofenac Sodium (VOLTAREN) 1 % GEL, Apply 1 Application topically 4 (four) times daily as needed (pain).   escitalopram (LEXAPRO) 5 MG tablet, Take 1 tablet by mouth daily.   esomeprazole (NEXIUM) 20 MG capsule, Take 20 mg by mouth daily as  needed (acid reflux).   gabapentin (NEURONTIN) 100 MG capsule, Take 100 mg by mouth 3 (three) times daily as needed.   hydrocortisone (ANUSOL-HC) 2.5 % rectal cream, Place 1 Application rectally 2 (two) times daily as needed.   lubiprostone (AMITIZA) 24 MCG capsule, Take 24 mcg by mouth as needed.   Na Sulfate-K Sulfate-Mg Sulfate concentrate (SUPREP) 17.5-3.13-1.6 GM/177ML SOLN, Use as directed; may use generic; goodrx card if insurance will not cover generic   docusate sodium (COLACE) 100 MG capsule, Take 1 capsule (100 mg total) by mouth 2 (two) times daily. (Patient not taking: Reported on 03/12/2024)   gabapentin (NEURONTIN) 300 MG capsule, Take 1 capsule (300 mg total) by mouth 4 (four) times daily. (Patient not taking: Reported on 01/15/2024)   ONE TOUCH ULTRA TEST test strip, USE 1 STRIP TO CHECK GLUCOSE TWICE DAILY (Patient not taking: Reported on 03/12/2024)  Medical History:  Past Medical History:  Diagnosis Date   ARF (acute renal failure) (HCC) 03/15/2022   Arthritis    Cancer (HCC) 10/03/2012   Colon   Constipation    CVA (cerebral vascular accident) (HCC) 03/15/2022   Degenerative arthritis    Diabetes mellitus without complication (HCC)    Dysrhythmia    A fib   Essential hypertension 11/23/2006   Qualifier: Diagnosis of   By: Sherlon Handing MD, Larene Pickett         GERD (gastroesophageal reflux disease)    Gout    History of adenomatous and serrated colon polyps 07/2019   Dr. Elnoria Howard - [facility]   Hyperlipidemia    Hypertension    Insomnia    Intervertebral disc syndrome 2024   Paroxysmal atrial fibrillation (HCC)    Stroke (HCC) 03/15/2022   T2DM (type 2 diabetes mellitus) (HCC) 03/15/2022   Allergies:  Allergies  Allergen Reactions   Metformin Diarrhea    Other Reaction(s): Diarrhea, Abdominal discomfort   Metformin And Related      Surgical History:  He  has a past surgical history that includes Carpal tunnel release; Liver ultrasound (12/11/2012); Colon  resection (12/11/2012); Colon surgery (12/11/2012); Knee arthroscopy; Back surgery (08/2022); and Knee surgery (Right). Family History:  His family history includes Diabetes in his mother; Huntington's disease in his father; Hypertension in his brother; Stroke in his mother.  REVIEW OF SYSTEMS  : All other systems reviewed and negative except where noted in the History of Present Illness.  PHYSICAL  EXAM: BP 126/70   Pulse 74   Ht 6\' 3"  (1.905 m)   Wt 248 lb (112.5 kg)   BMI 31.00 kg/m  Physical Exam   GENERAL APPEARANCE: Well nourished, in no apparent distress. HEENT: No cervical lymphadenopathy, unremarkable thyroid, sclerae anicteric, conjunctiva pink. RESPIRATORY: Respiratory effort normal, breath sounds clear to auscultation bilaterally without rales, rhonchi, or wheezing. CARDIO: Regular rate and rhythm with no murmurs, rubs, or gallops, peripheral pulses intact. ABDOMEN: Soft, non-distended, active bowel sounds in all four quadrants, no tenderness to palpation, no rebound, no mass appreciated, no abdominal pain. RECTAL: Declines. MUSCULOSKELETAL: Full range of motion, normal gait, without edema. SKIN: Dry, intact without rashes or lesions. No jaundice. NEURO: Alert, oriented, no focal deficits. PSYCH: Cooperative, normal mood and affect.      Doree Albee, PA-C 3:05 PM

## 2024-03-13 NOTE — Progress Notes (Signed)
 Agree with the assessment and plan as outlined by Quentin Mulling, PA-C. ? ?Keron Neenan, DO, FACG ? ?

## 2024-04-02 ENCOUNTER — Encounter: Payer: Self-pay | Admitting: Acute Care

## 2024-04-08 ENCOUNTER — Encounter (HOSPITAL_BASED_OUTPATIENT_CLINIC_OR_DEPARTMENT_OTHER): Payer: Self-pay

## 2024-04-08 DIAGNOSIS — G4730 Sleep apnea, unspecified: Secondary | ICD-10-CM

## 2024-04-21 ENCOUNTER — Telehealth: Payer: Self-pay | Admitting: Physician Assistant

## 2024-04-21 ENCOUNTER — Telehealth: Payer: Self-pay

## 2024-04-21 NOTE — Telephone Encounter (Addendum)
 Per cardiologist appointment on 2/12 it was stated okay to Eliquis  1 day prior to procedure is that okay with you. Please advise  Spoke to patient advise per cardiology okay to hold Eliquis  1 day prior to procedure. Patient verbalized understanding.

## 2024-04-21 NOTE — Telephone Encounter (Signed)
 Requesting f/u call to discuss eliquist. Please advise.

## 2024-04-23 ENCOUNTER — Encounter: Payer: Self-pay | Admitting: Gastroenterology

## 2024-04-23 ENCOUNTER — Ambulatory Visit (AMBULATORY_SURGERY_CENTER): Admitting: Gastroenterology

## 2024-04-23 VITALS — BP 120/76 | HR 63 | Temp 98.1°F | Resp 13 | Ht 75.0 in | Wt 248.0 lb

## 2024-04-23 DIAGNOSIS — D125 Benign neoplasm of sigmoid colon: Secondary | ICD-10-CM

## 2024-04-23 DIAGNOSIS — K635 Polyp of colon: Secondary | ICD-10-CM | POA: Diagnosis not present

## 2024-04-23 DIAGNOSIS — K5900 Constipation, unspecified: Secondary | ICD-10-CM

## 2024-04-23 DIAGNOSIS — K259 Gastric ulcer, unspecified as acute or chronic, without hemorrhage or perforation: Secondary | ICD-10-CM

## 2024-04-23 DIAGNOSIS — K319 Disease of stomach and duodenum, unspecified: Secondary | ICD-10-CM | POA: Diagnosis not present

## 2024-04-23 DIAGNOSIS — K3189 Other diseases of stomach and duodenum: Secondary | ICD-10-CM

## 2024-04-23 DIAGNOSIS — Z860100 Personal history of colon polyps, unspecified: Secondary | ICD-10-CM | POA: Diagnosis not present

## 2024-04-23 DIAGNOSIS — Z980 Intestinal bypass and anastomosis status: Secondary | ICD-10-CM | POA: Diagnosis not present

## 2024-04-23 DIAGNOSIS — D124 Benign neoplasm of descending colon: Secondary | ICD-10-CM

## 2024-04-23 DIAGNOSIS — Z1211 Encounter for screening for malignant neoplasm of colon: Secondary | ICD-10-CM | POA: Diagnosis present

## 2024-04-23 DIAGNOSIS — D123 Benign neoplasm of transverse colon: Secondary | ICD-10-CM | POA: Diagnosis not present

## 2024-04-23 DIAGNOSIS — Z85038 Personal history of other malignant neoplasm of large intestine: Secondary | ICD-10-CM | POA: Diagnosis not present

## 2024-04-23 DIAGNOSIS — K219 Gastro-esophageal reflux disease without esophagitis: Secondary | ICD-10-CM

## 2024-04-23 DIAGNOSIS — K641 Second degree hemorrhoids: Secondary | ICD-10-CM

## 2024-04-23 DIAGNOSIS — K5909 Other constipation: Secondary | ICD-10-CM

## 2024-04-23 MED ORDER — SODIUM CHLORIDE 0.9 % IV SOLN: 500.0000 mL | Freq: Once | INTRAVENOUS | Status: DC

## 2024-04-23 NOTE — Op Note (Signed)
 [facility] Patient Name: Gary Frederick Procedure Date: 04/23/2024 7:50 AM MRN: [id number] Endoscopist: Harry Lindau , MD, [id number] Age: 68 Referring MD:  Date of Birth: 05-30-1956 Gender: Male Account #: [id number] Procedure:                Colonoscopy Indications:              High risk colon cancer surveillance: Personal                            history of colonic polyps, High risk colon cancer                            surveillance: Personal history of colon cancer Medicines:                Monitored Anesthesia Care Procedure:                Pre-Anesthesia Assessment:                           - Prior to the procedure, a History and Physical                            was performed, and patient medications and                            allergies were reviewed. The patient's tolerance of                            previous anesthesia was also reviewed. The risks                            and benefits of the procedure and the sedation                            options and risks were discussed with the patient.                            All questions were answered, and informed consent                            was obtained. Prior Anticoagulants: The patient has                            taken Eliquis  (apixaban ), last dose was 2 days                            prior to procedure. ASA Grade Assessment: III - A                            patient with severe systemic disease. After                            reviewing the risks and benefits, the patient was  deemed in satisfactory condition to undergo the                            procedure.                           After obtaining informed consent, the colonoscope                            was passed under direct vision. Throughout the                            procedure, the patient's blood pressure, pulse, and                            oxygen  saturations were monitored  continuously. The                            CF [id number] #[id number] was introduced through the anus                            and advanced to the the ileocolonic anastomosis.                            The colonoscopy was performed without difficulty.                            The patient tolerated the procedure well. The                            quality of the bowel preparation was good. The                            terminal ileum and the rectum were photographed. Scope In: 8:16:53 AM Scope Out: 8:35:47 AM Scope Withdrawal Time: 0 hours 15 minutes 54 seconds  Total Procedure Duration: 0 hours 18 minutes 54 seconds  Findings:                 The perianal and digital rectal examinations were                            normal.                           There was evidence of a prior end-to-side                            colo-colonic anastomosis in the transverse colon.                            This was patent and was characterized by healthy                            appearing mucosa. The anastomosis was traversed.  Five sessile polyps were found in the sigmoid colon                            and descending colon. The polyps were 2 to 5 mm in                            size. These polyps were removed with a cold snare.                            Resection and retrieval were complete. Estimated                            blood loss was minimal.                           Four sessile polyps were found in the transverse                            colon. These were all located in close proximity to                            the ileocolonic anastamosis. The polyps were 2 to 3                            mm in size. These polyps were removed with a cold                            snare. Resection and retrieval were complete.                            Estimated blood loss was minimal.                           Non-bleeding internal hemorrhoids were found during                             retroflexion. The hemorrhoids were small and Grade                            II (internal hemorrhoids that prolapse but reduce                            spontaneously).                           The neo-terminal ileum appeared normal. Complications:            No immediate complications. Estimated Blood Loss:     Estimated blood loss was minimal. Impression:               - Patent end-to-side colo-colonic anastomosis,                            characterized by healthy appearing mucosa.                           -  Five 2 to 5 mm polyps in the sigmoid colon and in                            the descending colon, removed with a cold snare.                            Resected and retrieved.                           - Four benign appearing 2 to 3 mm polyps in the                            transverse colon located in close proximity to the                            ileocolonic anastamosis, removed with a cold snare.                            Resected and retrieved.                           - Non-bleeding internal hemorrhoids.                           - The examined portion of the ileum was normal. Recommendation:           - Patient has a contact number available for                            emergencies. The signs and symptoms of potential                            delayed complications were discussed with the                            patient. Return to normal activities tomorrow.                            Written discharge instructions were provided to the                            patient.                           - Resume previous diet.                           - Continue present medications.                           - Await pathology results.                           - Repeat colonoscopy for surveillance based on  pathology results.                           - Return to GI office PRN.                           - Resume  Eliquis  (apixaban ) at prior dose tomorrow. Harry Lindau, MD 04/23/2024 8:59:30 AM

## 2024-04-23 NOTE — Progress Notes (Signed)
 To pacu, VSS. Report to [email or web address]

## 2024-04-23 NOTE — Progress Notes (Signed)
 GASTROENTEROLOGY PROCEDURE H&P NOTE   Primary Care Physician: [facility], [facility]    Reason for Procedure:  GERD, dysphagia, colon cancer surveillance, constipation  Plan:    EGD, colonoscopy  Patient is appropriate for endoscopic procedure(s) in the ambulatory ([facility]) setting.  The nature of the procedure, as well as the risks, benefits, and alternatives were carefully and thoroughly reviewed with the patient. Ample time for discussion and questions allowed. The patient understood, was satisfied, and agreed to proceed.     HPI: Gary Frederick is a 68 y.o. male who presents for EGD for evaluation of GERD with episodic solid food dysphagia along with colonoscopy for ongoing colon cancer surveillance.  Does have a history of chronic idiopathic constipation and recently started on Linzess.  Last colonoscopy was 07/2019 by Dr. Nickey Barn and notable for 8 small 2-4 mm polyps in the rectum, rectosigmoid, descending, transverse colon removed with cold snare (no path for review), prior ileocolonic anastomosis in the transverse colon with recommendation to repeat in 3 years.  Has been holding his Eliquis  for procedures today.  Past Medical History:  Diagnosis Date   ARF (acute renal failure) (HCC) 03/15/2022   Arthritis    Cancer (HCC) 10/03/2012   Colon   Constipation    CVA (cerebral vascular accident) (HCC) 03/15/2022   Degenerative arthritis    Diabetes mellitus without complication (HCC)    Dysrhythmia    A fib   Essential hypertension 11/23/2006   Qualifier: Diagnosis of   By: Nanine Babcock MD, Audry Leavell         GERD (gastroesophageal reflux disease)    Gout    History of adenomatous and serrated colon polyps 07/2019   Dr. Nickey Barn - [facility]   Hyperlipidemia    Hypertension    Insomnia    Intervertebral disc syndrome 2024   Paroxysmal atrial fibrillation (HCC)    Stroke (HCC) 03/15/2022   T2DM (type 2 diabetes mellitus) (HCC) 03/15/2022    Past Surgical History:   Procedure Laterality Date   BACK SURGERY  08/2022   spinal fusion   CARPAL TUNNEL RELEASE     right   COLON RESECTION  12/11/2012   Procedure: LAPAROSCOPIC RIGHT COLON RESECTION;  Surgeon: Joyce Nixon, MD;  Location: [facility];  Service: General;  Laterality: Right;   COLON SURGERY  12/11/2012   KNEE ARTHROSCOPY     KNEE SURGERY Right    LIVER ULTRASOUND  12/11/2012   Procedure: LIVER ULTRASOUND;  Surgeon: Joyce Nixon, MD;  Location: [facility];  Service: General;  Laterality: Right;  Intraoperative Liver Ultrasound    Prior to Admission medications   Medication Sig Start Date End Date Taking? Authorizing Provider  acetaminophen  (TYLENOL ) 500 MG tablet Take 500 mg by mouth every 6 (six) hours as needed.   Yes [provider]  allopurinol  (ZYLOPRIM ) 100 MG tablet Take 100 mg by mouth daily. 09/29/21  Yes [provider]  amLODipine  (NORVASC ) 10 MG tablet Take 1 tablet (10 mg total) by mouth daily with lunch. 03/18/22  Yes Armenta Landau, MD  atorvastatin  (LIPITOR) 40 MG tablet Take 1 tablet (40 mg total) by mouth daily. 04/11/23  Yes Patwardhan, Manish J, MD  bisacodyl  (DULCOLAX) 5 MG EC tablet Take 10 mg by mouth daily as needed for moderate constipation.   Yes [provider]  clonazePAM (KLONOPIN) 0.5 MG tablet Take 0.5 mg by mouth at bedtime. 01/07/24  Yes [provider]  diclofenac Sodium (VOLTAREN) 1 % GEL Apply 1 Application topically  4 (four) times daily as needed (pain).   Yes [provider]  empagliflozin (JARDIANCE) 25 MG TABS tablet Take 12.5 mg by mouth daily. 01/30/23  Yes [provider]  empagliflozin (JARDIANCE) 25 MG TABS tablet Take 25 mg by mouth daily. 11/14/23  Yes [provider]  esomeprazole (NEXIUM) 20 MG capsule Take 20 mg by mouth daily as needed (acid reflux).   Yes [provider]  fluticasone  (FLONASE ) 50 MCG/ACT nasal spray Place 2 sprays into both nostrils daily as needed for allergies.  03/28/22  Yes [provider]  hydrocortisone (ANUSOL-HC) 2.5 % rectal cream Place 1 Application rectally 2 (two) times daily as needed. 11/14/23  Yes [provider]  lubiprostone (AMITIZA) 24 MCG capsule Take 24 mcg by mouth as needed. 01/03/24  Yes [provider]  metoprolol  succinate (TOPROL -XL) 50 MG 24 hr tablet Take 1 tablet (50 mg total) by mouth daily. 04/11/23  Yes Patwardhan, Kaye Parsons, MD  ONE TOUCH ULTRA TEST test strip  12/31/18  Yes [provider]  apixaban  (ELIQUIS ) 5 MG TABS tablet Take 1 tablet (5 mg total) by mouth 2 (two) times daily. **Restart on 08/08/2022** 04/11/23   Patwardhan, Kaye Parsons, MD  busPIRone (BUSPAR) 10 MG tablet Take by mouth. 11/07/23   [provider]  docusate sodium  (COLACE) 100 MG capsule Take 1 capsule (100 mg total) by mouth 2 (two) times daily. Patient not taking: Reported on 01/15/2024 08/04/22   Bergman, Meghan D, NP  doxazosin (CARDURA) 1 MG tablet Take 1 tablet by mouth daily. 01/07/24   [provider]  escitalopram (LEXAPRO) 5 MG tablet Take 1 tablet by mouth daily. 01/07/24   [provider]  gabapentin  (NEURONTIN ) 100 MG capsule Take 100 mg by mouth 3 (three) times daily as needed. 12/19/23   [provider]  gabapentin  (NEURONTIN ) 300 MG capsule Take 1 capsule (300 mg total) by mouth 4 (four) times daily. Patient not taking: Reported on 01/15/2024 12/13/23 01/12/24  Eloise Hake Scales, PA-C    Current Outpatient Medications  Medication Sig Dispense Refill   acetaminophen  (TYLENOL ) 500 MG tablet Take 500 mg by mouth every 6 (six) hours as needed.     allopurinol  (ZYLOPRIM ) 100 MG tablet Take 100 mg by mouth daily.     amLODipine  (NORVASC ) 10 MG tablet Take 1 tablet (10 mg total) by mouth daily with lunch.     atorvastatin  (LIPITOR) 40 MG tablet Take 1 tablet (40 mg total) by mouth daily. 90 tablet 3   bisacodyl  (DULCOLAX) 5 MG EC tablet Take 10 mg by mouth daily as needed for moderate  constipation.     clonazePAM (KLONOPIN) 0.5 MG tablet Take 0.5 mg by mouth at bedtime.     diclofenac Sodium (VOLTAREN) 1 % GEL Apply 1 Application topically 4 (four) times daily as needed (pain).     empagliflozin (JARDIANCE) 25 MG TABS tablet Take 12.5 mg by mouth daily.     empagliflozin (JARDIANCE) 25 MG TABS tablet Take 25 mg by mouth daily.     esomeprazole (NEXIUM) 20 MG capsule Take 20 mg by mouth daily as needed (acid reflux).     fluticasone  (FLONASE ) 50 MCG/ACT nasal spray Place 2 sprays into both nostrils daily as needed for allergies.     hydrocortisone (ANUSOL-HC) 2.5 % rectal cream Place 1 Application rectally 2 (two) times daily as needed.     lubiprostone (AMITIZA) 24 MCG capsule Take 24 mcg by mouth as needed.     metoprolol   succinate (TOPROL -XL) 50 MG 24 hr tablet Take 1 tablet (50 mg total) by mouth daily. 90 tablet 3   ONE TOUCH ULTRA TEST test strip      apixaban  (ELIQUIS ) 5 MG TABS tablet Take 1 tablet (5 mg total) by mouth 2 (two) times daily. **Restart on 08/08/2022** 180 tablet 3   busPIRone (BUSPAR) 10 MG tablet Take by mouth.     docusate sodium  (COLACE) 100 MG capsule Take 1 capsule (100 mg total) by mouth 2 (two) times daily. (Patient not taking: Reported on 01/15/2024) 10 capsule 0   doxazosin (CARDURA) 1 MG tablet Take 1 tablet by mouth daily.     escitalopram (LEXAPRO) 5 MG tablet Take 1 tablet by mouth daily.     gabapentin  (NEURONTIN ) 100 MG capsule Take 100 mg by mouth 3 (three) times daily as needed.     gabapentin  (NEURONTIN ) 300 MG capsule Take 1 capsule (300 mg total) by mouth 4 (four) times daily. (Patient not taking: Reported on 01/15/2024) 120 capsule 0   Current Facility-Administered Medications  Medication Dose Route Frequency Provider Last Rate Last Admin   0.9 %  sodium chloride  infusion  500 mL Intravenous Once Toby Breithaupt V, DO        Allergies as of 04/23/2024 - Review Complete 04/23/2024  Allergen Reaction Noted   Metformin Diarrhea  11/27/2022    Family History  Problem Relation Age of Onset   Diabetes Mother    Stroke Mother    Huntington's disease Father    Hypertension Brother     Social History   Socioeconomic History   Marital status: Married    Spouse name: Surveyor, minerals   Number of children: 4   Years of education: Not on file   Highest education level: Not on file  Occupational History   Occupation: Trucking    Comment: Disabled  Tobacco Use   Smoking status: Every Day    Current packs/day: 0.50    Average packs/day: 0.5 packs/day for 30.0 years (15.0 ttl pk-yrs)    Types: Cigarettes   Smokeless tobacco: Never  Vaping Use   Vaping status: Never Used  Substance and Sexual Activity   Alcohol use: Not Currently    Alcohol/week: 5.0 standard drinks of alcohol    Types: 5 Cans of beer per week   Drug use: No   Sexual activity: Not Currently  Other Topics Concern   Not on file  Social History Narrative   Not on file   Social Drivers of Health   Financial Resource Strain: Not on file  Food Insecurity: Not on file  Transportation Needs: Not on file  Physical Activity: Not on file  Stress: Not on file  Social Connections: Not on file  Intimate Partner Violence: Not on file    Physical Exam: Vital signs in last 24 hours: @BP  (!) 140/80   Pulse 74   Temp 98.1 F (36.7 C) (Skin)   Ht 6\' 3"  (1.905 m)   Wt 248 lb (112.5 kg)   SpO2 96%   BMI 31.00 kg/m  GEN: NAD EYE: Sclerae anicteric ENT: MMM CV: Non-tachycardic Pulm: CTA b/l GI: Soft, NT/ND NEURO:  Alert & Oriented x 3   Harry Lindau, DO [facility]   04/23/2024 7:52 AM

## 2024-04-23 NOTE — Patient Instructions (Signed)

## 2024-04-23 NOTE — Progress Notes (Signed)
 Pt's states no medical or surgical changes since previsit or office visit.

## 2024-04-23 NOTE — Op Note (Signed)
 [facility] Patient Name: Gary Frederick Procedure Date: 04/23/2024 7:50 AM MRN: [id number] Endoscopist: Harry Lindau , MD, [id number] Age: 68 Referring MD:  Date of Birth: 1956/11/02 Gender: Male Account #: [id number] Procedure:                Upper GI endoscopy Indications:              Heartburn, Esophageal reflux Medicines:                Monitored Anesthesia Care Procedure:                Pre-Anesthesia Assessment:                           - Prior to the procedure, a History and Physical                            was performed, and patient medications and                            allergies were reviewed. The patient's tolerance of                            previous anesthesia was also reviewed. The risks                            and benefits of the procedure and the sedation                            options and risks were discussed with the patient.                            All questions were answered, and informed consent                            was obtained. Prior Anticoagulants: The patient has                            taken Eliquis  (apixaban ), last dose was 2 days                            prior to procedure. ASA Grade Assessment: III - A                            patient with severe systemic disease. After                            reviewing the risks and benefits, the patient was                            deemed in satisfactory condition to undergo the                            procedure.  After obtaining informed consent, the endoscope was                            passed under direct vision. Throughout the                            procedure, the patient's blood pressure, pulse, and                            oxygen  saturations were monitored continuously. The                            Olympus Scope [id number] was introduced through the                            mouth, and advanced to the second part of  duodenum.                            The upper GI endoscopy was accomplished without                            difficulty. The patient tolerated the procedure                            well. Scope In: Scope Out: Findings:                 The upper third of the esophagus, middle third of                            the esophagus and lower third of the esophagus were                            normal.                           Localized mild inflammation characterized by                            congestion (edema) and erythema was found at the                            gastroesophageal junction. Biopsies were taken with                            a cold forceps for histology. Estimated blood loss                            was minimal.                           One non-bleeding superficial gastric ulcer with                            surrounding mucosal erythema and edema was found in  the prepyloric region of the stomach. The ulcer was                            2 mm in largest dimension. Biopsies were taken with                            a cold forceps for histology. Estimated blood loss                            was minimal.                           The gastric fundus, gastric body and incisura were                            normal. Biopsies were taken with a cold forceps for                            Helicobacter pylori testing. Estimated blood loss                            was minimal.                           The examined duodenum was normal. Complications:            No immediate complications. Estimated Blood Loss:     Estimated blood loss was minimal. Impression:               - Normal upper third of esophagus, middle third of                            esophagus and lower third of esophagus.                           - Esophageal mucosal changes were present,                            including congestion (edema) and erythema. Findings                             are suggestive of reflux inflammation. Biopsied.                           - Non-bleeding gastric ulcer. Biopsied.                           - Normal gastric fundus, gastric body and incisura.                            Biopsied.                           - Normal examined duodenum. Recommendation:           - Patient has a contact number available for  emergencies. The signs and symptoms of potential                            delayed complications were discussed with the                            patient. Return to normal activities tomorrow.                            Written discharge instructions were provided to the                            patient.                           - Resume previous diet.                           - Continue present medications.                           - Await pathology results.                           - Perform a colonoscopy today. Harry Lindau, MD 04/23/2024 8:53:46 AM

## 2024-04-23 NOTE — Progress Notes (Signed)
 Called to room to assist during endoscopic procedure.  Patient ID and intended procedure confirmed with present staff. Received instructions for my participation in the procedure from the performing physician.

## 2024-04-24 ENCOUNTER — Ambulatory Visit
Admission: RE | Admit: 2024-04-24 | Discharge: 2024-04-24 | Disposition: A | Discharge status: Home / Self Care | Source: Ambulatory Visit | Attending: Acute Care | Admitting: Acute Care

## 2024-04-24 ENCOUNTER — Telehealth: Payer: Self-pay

## 2024-04-24 DIAGNOSIS — F17210 Nicotine dependence, cigarettes, uncomplicated: Secondary | ICD-10-CM

## 2024-04-24 DIAGNOSIS — Z122 Encounter for screening for malignant neoplasm of respiratory organs: Secondary | ICD-10-CM

## 2024-04-24 DIAGNOSIS — Z87891 Personal history of nicotine dependence: Secondary | ICD-10-CM

## 2024-04-24 NOTE — Telephone Encounter (Signed)
 Inbound call from [facility] requesting office visit notes from 03/12/24. Also requesting procedure repot as well. Please advise at fax number [phone].   Thank you

## 2024-04-24 NOTE — Telephone Encounter (Signed)
  Follow up Call-     04/23/2024    7:20 AM  Call back number  Post procedure Call Back phone  # [phone]  Permission to leave phone message Yes     Attempted to call patient regarding follow-up. No answer, VM left.

## 2024-04-28 ENCOUNTER — Ambulatory Visit (HOSPITAL_BASED_OUTPATIENT_CLINIC_OR_DEPARTMENT_OTHER): Attending: Infectious Diseases | Admitting: Internal Medicine

## 2024-04-28 DIAGNOSIS — G4733 Obstructive sleep apnea (adult) (pediatric): Secondary | ICD-10-CM | POA: Diagnosis present

## 2024-04-28 DIAGNOSIS — G4730 Sleep apnea, unspecified: Secondary | ICD-10-CM

## 2024-04-29 LAB — SURGICAL PATHOLOGY

## 2024-05-03 DIAGNOSIS — G4730 Sleep apnea, unspecified: Secondary | ICD-10-CM | POA: Diagnosis not present

## 2024-05-03 NOTE — Procedures (Signed)
 [facility] [address] [place], [place] [address] Tel: [phone]   Fax: [phone]  Titration Interpretation  Patient Name:  Gary Frederick, Gary Frederick Date:  04/28/2024 Referring Physician:  Dr. Lorelei Rogers  Indications for Polysomnography The patient is a 68 year old Male who is 6\' 3"  and weighs 248.0 lbs. His BMI equals 31.2.  A full night titration treatment study was performed.  Baseline diagnostic HST 02/14/24  [facility]I [facility]  No medication was reported taken while at the sleep center.  No Data.   Polysomnogram Data A full night polysomnogram recorded the standard physiologic parameters including EEG, EOG, EMG, EKG, nasal and oral airflow.  Respiratory parameters of chest and abdominal movements were recorded with Respiratory Inductance Plethysmography belts.  Oxygen  saturation was recorded by pulse oximetry.   Sleep Architecture The total recording time of the polysomnogram was 366.9 minutes.  The total sleep time was 181.5 minutes.  The patient spent 20.4% of total sleep time in Stage N1, 70.2% in Stage N2, 0.0% in Stages N3, and 9.4% in REM.  Sleep latency was 43.2 minutes.  REM latency was 86.5 minutes.  Sleep Efficiency was 49.5%.  Wake after Sleep Onset time was 142.0 minutes.  Titration Summary The patient was titrated at pressures ranging from 6* cm/H20 with supplemental oxygen  at - up to 16* cm/H20 with supplemental oxygen  at -.  The last pressure used in the study was 16* cm/H20 with supplemental oxygen  at -.  Respiratory Events The polysomnogram revealed a presence of 1 obstructive, - central, and - mixed apneas resulting in an Apnea index of 0.3 events per hour.  There were 32 hypopneas (>=3% desaturation and/or arousal) resulting in an Apnea\Hypopnea Index (AHI >=3% desaturation and/or arousal) of 10.9 events per hour.  There were 9 hypopneas (>=4% desaturation) resulting in an Apnea\Hypopnea Index (AHI >=4%  desaturation) of 3.3 events per hour.  There were 34 Respiratory Effort Related Arousals resulting in a RERA index of 11.2 events per hour. The Respiratory Disturbance Index is 22.1 events per hour.  The snore index was 71.1 events per hour.  Mean oxygen  saturation was 92.7%.  The lowest oxygen  saturation during sleep was 88.0%.  Time spent <=88% oxygen  saturation was 1.1 minutes (0.3%).  Limb Activity There were - limb movements recorded.  Of this total, - were classified as PLMs.  Of the PLMs, - were associated with arousals.  The Limb Movement index was - per hour while the PLM index was - per hour.  Cardiac Summary The average pulse rate was 51.8 bpm.  The minimum pulse rate was 45.0 bpm while the maximum pulse rate was 75.0 bpm.  Cardiac rhythm was normal- occasional PVC, PAC.  Comment: CPAP titration to 16 cwp, residual AHI (3%) 3.8/hr, minimum O2 saturation 91%, mean 92.7%.  Diagnosis: Suggest autopap 10-20 or fixed CPAP 16. Patient wore a large [employer] Simplus full face mask with heated humidificaton.  Recommendations:   This study was personally reviewed and electronically signed by: Dr. [employer] Accredited Board Certified in Sleep Medicine Date/Time: 05/03/24   11:24   Titration Report  Patient Name: Gary Frederick Study Date: 04/28/2024  Date of Birth: Jan 04, 1956 Study Type: CPAP Titration  Age: 40 year MRN #: [id number]  Sex: Male Interpreting Physician: [employer] [id number]  Height: 6\' 3"  Referring Physician: Dr. Lorelei Rogers  Weight: 248.0 lbs Recording Tech: Roosvelt Colla RRT RPSGT RST  BMI: 31.2 Scoring Tech: Roosvelt Colla RRT RPSGT  RST  ESS: 8 Neck Size: 17.5  Mask Type [employer] Simplus Final Pressure: 16  Mask Size: Large Supplemental O2: -   Study Overview  Lights Off: 10:47:45 PM  Count Index  Lights On: 04:54:42 AM Awakenings: 44 14.5  Time in Bed: 366.9 min. Arousals: 97 32.1  Total Sleep Time: 181.5 min. AHI (>=3% Desat and/or  Ar.): 33 10.9   Sleep Efficiency: 49.5% AHI (>=4% Desat): 10 3.3   Sleep Latency: 43.2 min. Limb Movements: - -  Wake After Sleep Onset: 142.0 min. Snore: 215 71.1  REM Latency from Sleep Onset: 86.5 min. Desaturations: 32 10.6     Minimum SpO2 TST: 88.0%    Sleep Architecture  % of Time in Bed Stages Time (mins) % Sleep Time  Wake 186.0   Stage N1 37.0 20.4%  Stage N2 127.5 70.2%  Stage N3 0.0 0.0%  REM 17.0 9.4%   Arousal Summary   NREM REM Sleep Index  Respiratory Arousals 46 - 46 15.2  PLM Arousals - - - -  Isolated Limb Movement Arousals - - - -  Snore Arousals 5 - 5 1.7  Spontaneous Arousals 44 2 46 15.2  Total 95 2 97 32.1   Limb Movement Summary   Count Index  Isolated Limb Movements - -  Periodic Limb Movements (PLMs) - -  Total Limb Movements - -    Respiratory Summary   By Sleep Stage By Body Position Total   NREM REM Supine Non-Supine   Time (min) 164.5 17.0 76.5 105.0 181.5         Obstructive Apnea 1 - 1 - 1  Mixed Apnea - - - - -  Central Apnea - - - - -  Total Apneas 1 - 1 - 1  Total Apnea Index 0.4 - 0.8 - 0.3         Hypopneas (>=3% Desat and/or Ar.) 31 1 25 7  32  AHI (>=3% Desat and/or Ar.) 11.7 3.5 20.4 4.0 10.9         Hypopneas (>=4% Desat) 9 - 8 1 9   AHI (>=4% Desat) 3.6 - 7.1 0.6 3.3          RERAs 34 - 30 4 34  RERA Index 12.4 - 23.5 2.3 11.2         RDI 24.1 3.5 43.9 6.3 22.1     Respiratory Event Durations   Apnea Hypopnea   NREM REM NREM REM  Average (seconds) 21.6 - 29.1 57.8  Maximum (seconds) 21.6 - 92.5 57.8    Oxygen  Saturation Summary   Wake NREM REM TST TIB  Average SpO2 93.5% 91.9% 91.1% 91.8% 92.7%  Minimum SpO2 87.0% 88.0% 89.0% 88.0% 87.0%  Maximum SpO2 98.0% 98.0% 93.0% 98.0% 98.0%   Oxygen  Saturation Distribution  Range (%) Time in range (min) Time in range (%)  90.0 - 100.0 339.1 94.4%  80.0 - 90.0 19.9 5.6%  70.0 - 80.0 - -  60.0 - 70.0 - -  50.0 - 60.0 - -  0.0 - 50.0 - -  Time Spent <=88%  SpO2  Range (%) Time in range (min) Time in range (%)  0.0 - 88.0 1.1 0.3%      Count Index  Desaturations 32 10.6    Cardiac Summary   Wake NREM REM Sleep Total  Average Pulse Rate (BPM) 54.3 49.4 49.5 49.5 51.8  Minimum Pulse Rate (BPM) 45.0 45.0 45.0 45.0 45.0  Maximum Pulse Rate (BPM) 75.0 74.0 63.0 74.0  75.0   Pulse Rate Distribution:  Range (bpm) Time in range (min) Time in range (%)  0.0 - 40.0 - -  40.0 - 60.0 339.1 94.3%  60.0 - 80.0 14.8 4.1%  80.0 - 100.0 - -  100.0 - 120.0 - -  120.0 - 140.0 - -  140.0 - 200.0 - -   Titration Summary  PAP Device PAP Level O2 Level Time (min) Wake (min) NREM (min) REM (min) Sleep Eff% OA# CA# MA# Hyp# (>=3%) AHI (>=3%) Hyp# (>=4%) AHI (>=%4) RERA RDI SpO2 <=88% (min) Min SpO2 Mean SpO2 Ar. Index  CPAP 6 - 72.0 59.5 12.5 0.0 17.4% - - - 3 14.4 -  - 10  62.4  0.0 90.0 92.2 76.8  CPAP 8 - 26.0 11.5 14.5 0.0 55.8% - - - 14 57.9 4  16.6 7  86.9  0.1 88.0 92.2 70.3  CPAP 10 - 54.0 22.0 28.5 3.5 59.3% - - - 6 11.3 2  3.8 3  16.9  0.4 88.0 91.6 46.9  CPAP 12 - 112.5 44.0 68.5 0.0 60.9% - - - 6 5.3 2  1.8 9  13.1  0.0 89.0 92.3 14.9  CPAP 14 - 81.5 43.5 24.5 13.5 46.6% - - - 3 4.7 1  1.6 3  9.5  0.0 89.0 91.7 23.7  CPAP 16 - 21.5 5.5 16.0 0.0 74.4% 1 - - - 3.8 -  3.8 2  11.3  0.0 91.0 92.7 26.3    Hypnograms                           Technologist Comments  Patient was ordered as a CPAP titration. Patient is a 68 year old African American male who was sent to the sleep center for OSA. Patient was placed on CPAP of 6 CMH2O with 2 CMH2O of EPR at 10:47 pm and was increased to a CPAP pressure of 16 CMH2O with 2 CMH2O of EPR. There was no audible snoring noted on final pressure setting. Patient was increased for respiratory events with and without a 4% Desat or arousal, and audible snoring. Patient tolerated CPAP trial fairly well. No oxygen  was applied. No medications were reported taken. No PLM's/PLMA's were  noted. Questionable cardiac arrhythmias were noted see attached sheets and epochs for examples: 184, 209, 579, 597, etc. One restroom visit was noted. Patient was fitted with a [employer]kel Simplus nasal/oral full-face mask size large with heated humidification.                            [employer] Diplomate, Biomedical engineer of Sleep Medicine  ELECTRONICALLY SIGNED ON:  05/03/2024, 11:17 AM [facility] PH: [phone]   FX: [phone] ACCREDITED BY THE AMERICAN ACADEMY OF SLEEP MEDICINE

## 2024-05-08 ENCOUNTER — Ambulatory Visit: Payer: Self-pay | Admitting: Gastroenterology

## 2024-05-11 ENCOUNTER — Other Ambulatory Visit: Payer: Self-pay

## 2024-05-11 ENCOUNTER — Telehealth: Payer: Self-pay | Admitting: Cardiology

## 2024-05-11 DIAGNOSIS — I480 Paroxysmal atrial fibrillation: Secondary | ICD-10-CM

## 2024-05-11 MED ORDER — APIXABAN 5 MG PO TABS: 5.0000 mg | ORAL_TABLET | Freq: Two times a day (BID) | ORAL | 0 refills | Status: DC

## 2024-05-11 NOTE — Telephone Encounter (Signed)
 *  STAT* If patient is at the pharmacy, call can be transferred to refill team.   1. Which medications need to be refilled? (please list name of each medication and dose if known) apixaban  (ELIQUIS ) 5 MG TABS tablet    2. Would you like to learn more about the convenience, safety, & potential cost savings by using the [facility]? No      3. Are you open to using the [facility] (Type [facility]. ). No    4. Which pharmacy/location (including street and city if local pharmacy) is medication to be sent to?  [facility] - [place], [place] - [address]     5. Do they need a 30 day or 90 day supply? 90 days   Pt need refill today, he is almost out of meds

## 2024-05-11 NOTE — Telephone Encounter (Signed)
 Prescription refill request for Eliquis  received. Indication:afib Last office visit:2/25 HYQ:MVHQI labs Age: 68 Weight:112.5  kg  Prescription refilled

## 2024-05-18 ENCOUNTER — Other Ambulatory Visit: Payer: Self-pay | Admitting: Acute Care

## 2024-05-18 DIAGNOSIS — F17210 Nicotine dependence, cigarettes, uncomplicated: Secondary | ICD-10-CM

## 2024-05-18 DIAGNOSIS — Z122 Encounter for screening for malignant neoplasm of respiratory organs: Secondary | ICD-10-CM

## 2024-05-18 DIAGNOSIS — Z87891 Personal history of nicotine dependence: Secondary | ICD-10-CM

## 2024-05-27 ENCOUNTER — Ambulatory Visit: Payer: Self-pay | Admitting: Cardiology

## 2024-05-27 ENCOUNTER — Other Ambulatory Visit: Payer: Self-pay

## 2024-05-27 LAB — CBC WITH DIFFERENTIAL/PLATELET
Basophils Absolute: 0 10*3/uL (ref 0.0–0.2)
Basos: 0 %
EOS (ABSOLUTE): 0.1 10*3/uL (ref 0.0–0.4)
Eos: 2 %
Hematocrit: 49.7 % (ref 37.5–51.0)
Hemoglobin: 16.6 g/dL (ref 13.0–17.7)
Immature Grans (Abs): 0 10*3/uL (ref 0.0–0.1)
Immature Granulocytes: 0 %
Lymphocytes Absolute: 2 10*3/uL (ref 0.7–3.1)
Lymphs: 33 %
MCH: 30.1 pg (ref 26.6–33.0)
MCHC: 33.4 g/dL (ref 31.5–35.7)
MCV: 90 fL (ref 79–97)
Monocytes Absolute: 0.6 10*3/uL (ref 0.1–0.9)
Monocytes: 10 %
Neutrophils Absolute: 3.2 10*3/uL (ref 1.4–7.0)
Neutrophils: 55 %
Platelets: 187 10*3/uL (ref 150–450)
RBC: 5.51 x10E6/uL (ref 4.14–5.80)
RDW: 13.8 % (ref 11.6–15.4)
WBC: 5.9 10*3/uL (ref 3.4–10.8)

## 2024-05-27 LAB — BASIC METABOLIC PANEL WITH GFR
BUN/Creatinine Ratio: 17 (ref 10–24)
BUN: 13 mg/dL (ref 8–27)
CO2: 20 mmol/L (ref 20–29)
Calcium: 9.1 mg/dL (ref 8.6–10.2)
Chloride: 105 mmol/L (ref 96–106)
Creatinine, Ser: 0.78 mg/dL (ref 0.76–1.27)
Glucose: 117 mg/dL — ABNORMAL HIGH (ref 70–99)
Potassium: 3.9 mmol/L (ref 3.5–5.2)
Sodium: 143 mmol/L (ref 134–144)
eGFR: 98 mL/min/{1.73_m2} (ref >59)

## 2024-05-27 MED ORDER — APIXABAN 5 MG PO TABS: 5.0000 mg | ORAL_TABLET | Freq: Two times a day (BID) | ORAL | 5 refills | Status: DC

## 2024-05-27 NOTE — Progress Notes (Signed)
 Labs look good. Okay to refill Eliquis  5 mg bid.  Thanks MJP

## 2024-05-27 NOTE — Telephone Encounter (Signed)
 Prescription refill request for Eliquis  received. Indication:afib Last office visit:2/25 Scr:0.78  6/25 Age: 68 Weight:112.5  kg  Prescription refilled

## 2024-06-03 ENCOUNTER — Telehealth: Payer: Self-pay | Admitting: Cardiology

## 2024-06-03 NOTE — Telephone Encounter (Signed)
  Pt is calling to get lab result °

## 2024-06-03 NOTE — Telephone Encounter (Signed)
 Newman JINNY Lawrence, MD 05/27/2024 10:10 AM EDT     Labs look good. Okay to refill Eliquis  5 mg bid.   Thanks MJP   The patient has been notified of the result and verbalized understanding.  All questions (if any) were answered. Phoenyx Paulsen Chauvigne, RN 06/03/2024 10:43 AM

## 2024-08-12 ENCOUNTER — Ambulatory Visit: Admitting: Podiatry

## 2024-10-14 ENCOUNTER — Ambulatory Visit: Attending: Cardiology | Admitting: Cardiology

## 2024-10-14 ENCOUNTER — Encounter: Payer: Self-pay | Admitting: Cardiology

## 2024-10-14 VITALS — BP 132/84 | HR 75 | Ht 75.0 in | Wt 256.0 lb

## 2024-10-14 DIAGNOSIS — E782 Mixed hyperlipidemia: Secondary | ICD-10-CM | POA: Diagnosis not present

## 2024-10-14 DIAGNOSIS — I10 Essential (primary) hypertension: Secondary | ICD-10-CM

## 2024-10-14 DIAGNOSIS — I480 Paroxysmal atrial fibrillation: Secondary | ICD-10-CM | POA: Diagnosis not present

## 2024-10-14 NOTE — Progress Notes (Signed)
 Cardiology Office Note:  .   Date:  10/14/2024  ID:  Gary Frederick, DOB 09-02-1956, MRN [id number] [facility]: [facility]  [facility] Providers Cardiologist:  Newman Lawrence, MD PCP: [facility]  Chief Complaint  Patient presents with   PAF      History of Present Illness: .    Gary Frederick is a 68 y.o. male with hypertension, hyperlipidemia, type 2 diabetes mellitus, h/o stroke with minimal residual neurodeficit, PAF, smoker, h/o alcohol abuse, cannabis use, gout, colon cancer in remission   Patient is doing well. He denies chest pain, shortness of breath, palpitations, leg edema, orthopnea, PND, TIA/syncope. He also denies any bleeding issues.    Vitals:   10/14/24 1053  BP: 132/84  Pulse: 75  SpO2: 96%      ROS:  Review of Systems  Cardiovascular:  Negative for chest pain, dyspnea on exertion, leg swelling, palpitations and syncope.     Studies Reviewed: SABRA       EKG 10/16/2023: Sinus rhythm with 1st degree A-V block Left axis deviation Nonspecific T wave abnormality When compared with ECG of 17-Mar-2022 17:14, No significant change was found  Echocardiogram 2023:  1. Left ventricular ejection fraction, by estimation, is 60 to 65%. The  left ventricle has normal function. The left ventricle has no regional  wall motion abnormalities. Left ventricular diastolic function could not  be evaluated.   2. Right ventricular systolic function is normal. The right ventricular  size is normal.   3. The mitral valve is normal in structure. Trivial mitral valve  regurgitation.   4. The aortic valve is normal in structure. Aortic valve regurgitation is  not visualized. No aortic stenosis is present.   Ambulatory cardiac telemetry 2 days (11/07/2019 - 11/08/2021): Predominant underlying rhythm was sinus with episodes of supraventricular tachycardia, and atrial fibrillation/flutter burden of 5%.  Longest episode of A. fib/flutter  lasting 50 minutes.  Rare PACs and PVCs.  There were no patient triggered events.   Labs 05/2024: Hb 16.6 Cr 0.78  10/2023: Chol 137, TG 118, HDL 37, LDL 79  2023: HbA1C 6.2%     Risk Assessment/Calculations:    CHA2DS2-VASc Score = 5  This indicates a 7.2% annual risk of stroke. The patient's score is based upon: CHF History: 0 HTN History: 1 Diabetes History: 1 Stroke History: 2 Vascular Disease History: 0 Age Score: 1 Gender Score: 0   Physical Exam:   Physical Exam Vitals and nursing note reviewed.  Constitutional:      General: He is not in acute distress. Neck:     Vascular: No JVD.  Cardiovascular:     Rate and Rhythm: Normal rate and regular rhythm.     Heart sounds: Normal heart sounds. No murmur heard. Pulmonary:     Effort: Pulmonary effort is normal.     Breath sounds: Normal breath sounds. No wheezing or rales.  Musculoskeletal:     Right lower leg: No edema.     Left lower leg: No edema.      VISIT DIAGNOSES:   ICD-10-CM   1. Paroxysmal atrial fibrillation (HCC)  I48.0     2. Mixed hyperlipidemia  E78.2     3. Primary hypertension  I10         ASSESSMENT AND PLAN: .    Gary Frederick is a 68 y.o. male with hypertension, hyperlipidemia, type 2 diabetes mellitus, h/o stroke with minimal residual neurodeficit, PAF, smoker, h/o alcohol abuse, cannabis use,  gout, colon cancer in remission   PAF: H/o stroke. CHA2DS2VASc score 3, annual stroke risk 3.2%. Continue Eliquis  5 mg twice daily   Mixed hyperlipidemia: Continue Lipitor 40 mg daily.   Hypertension:  Controlled   Continue follow up w/PCP. Given continued clinical stability from cardiac standpoint, I will see him as needed  Signed, Newman JINNY Lawrence, MD

## 2024-10-14 NOTE — Patient Instructions (Signed)
 Follow-Up: At [facility], you and your health needs are our priority.  As part of our continuing mission to provide you with exceptional heart care, our providers are all part of one team.  This team includes your primary Cardiologist (physician) and Advanced Practice Providers or APPs (Physician Assistants and Nurse Practitioners) who all work together to provide you with the care you need, when you need it.  Your next appointment:   As needed   Provider:   Cody Das, MD    We recommend signing up for the patient portal called "MyChart".  Sign up information is provided on this After Visit Summary.  MyChart is used to connect with patients for Virtual Visits (Telemedicine).  Patients are able to view lab/test results, encounter notes, upcoming appointments, etc.  Non-urgent messages can be sent to your provider as well.   To learn more about what you can do with MyChart, go to [email or web address].

## 2024-11-24 ENCOUNTER — Ambulatory Visit (HOSPITAL_COMMUNITY): Admission: EM | Admit: 2024-11-24 | Discharge: 2024-11-24 | Disposition: A | Discharge status: Home / Self Care

## 2024-11-24 ENCOUNTER — Encounter (HOSPITAL_COMMUNITY): Payer: Self-pay

## 2024-11-24 ENCOUNTER — Other Ambulatory Visit (HOSPITAL_COMMUNITY): Payer: Self-pay

## 2024-11-24 DIAGNOSIS — J00 Acute nasopharyngitis [common cold]: Secondary | ICD-10-CM

## 2024-11-24 MED ORDER — GUAIFENESIN ER 1200 MG PO TB12
1.0000 | ORAL_TABLET | Freq: Two times a day (BID) | ORAL | 0 refills | Status: AC | PRN
  Filled 2024-11-24: qty 14, 7d supply, fill #0

## 2024-11-24 NOTE — ED Triage Notes (Signed)
 Patient c/o nasal congestion, a productive cough with blood-tinged/yellow/green sputum, body aches, and headache x 3 days.  Patient states he has taken Allegra and used nasal spray for his symptoms.

## 2024-11-24 NOTE — ED Provider Notes (Signed)
 " [facility]    CSN: [id number] Arrival date & time: 11/24/24  1121      History   Chief Complaint Chief Complaint  Patient presents with   Nasal Congestion   Cough   Generalized Body Aches   Headache    HPI Gary Frederick is a 68 y.o. male.   [name] presents today with complaint of illness that began 3 days ago and has been unchanged since onset.  He reports that he has had left sinus sinus pressure/headaches, nasal congestion, mild intermittent productive cough, and mild bodyaches.  He reports that he has remained afebrile without ear pain, sore throat, shortness of breath, wheezing, nausea, vomiting, and diarrhea.  He reports that his cough is manageable and does not keep him up at night.  He took fluticasone  nasal spray and Allegra for his symptoms without relief.  He denies known sick exposures and denies recent illness  The history is provided by the patient.  Cough Associated symptoms: headaches and myalgias   Associated symptoms: no ear pain, no rhinorrhea, no shortness of breath, no sore throat and no wheezing   Headache Associated symptoms: congestion, cough, myalgias and sinus pressure   Associated symptoms: no drainage, no ear pain and no sore throat     Past Medical History:  Diagnosis Date   ARF (acute renal failure) 03/15/2022   Arthritis    Cancer (HCC) 10/03/2012   Colon   Constipation    CVA (cerebral vascular accident) (HCC) 03/15/2022   Degenerative arthritis    Diabetes mellitus without complication (HCC)    Dysrhythmia    A fib   Essential hypertension 11/23/2006   Qualifier: Diagnosis of   By: Evern MD, Janetta         GERD (gastroesophageal reflux disease)    Gout    History of adenomatous and serrated colon polyps 07/2019   Dr. Rollin - [facility]   Hyperlipidemia    Hypertension    Insomnia    Intervertebral disc syndrome 2024   Paroxysmal atrial fibrillation (HCC)    Stroke (HCC) 03/15/2022   T2DM (type 2  diabetes mellitus) (HCC) 03/15/2022    Patient Active Problem List   Diagnosis Date Noted   Anxiety disorder, unspecified 01/07/2024   Personal history of other malignant neoplasm of large intestine 01/07/2024   Post-traumatic stress disorder, chronic 01/07/2024   Unspecified atrial fibrillation (HCC) 01/07/2024   Lumbar spinal stenosis 08/03/2022   Nicotine  dependence, cigarettes, uncomplicated 07/05/2022   NSVT (nonsustained ventricular tachycardia) (HCC) 03/17/2022   Dysarthria    Facial droop    Paroxysmal atrial fibrillation (HCC)    Hyperlipidemia    CVA (cerebral vascular accident) (HCC) 03/15/2022   Alcohol abuse 03/15/2022   Marijuana abuse 03/15/2022   T2DM (type 2 diabetes mellitus) (HCC) 03/15/2022   ARF (acute renal failure) 03/15/2022   Malignant neoplasm of colon, unspecified (HCC) 10/21/2012   HYPERLIPIDEMIA 12/04/2006   LYMPHADENOPATHY 12/04/2006   CARPAL TUNNEL RELEASE, RIGHT, HX OF 12/04/2006   GOUT 11/23/2006   ALCOHOL ABUSE 11/23/2006   TOBACCO ABUSE 11/23/2006   Essential hypertension 11/23/2006    Past Surgical History:  Procedure Laterality Date   BACK SURGERY  08/2022   spinal fusion   CARPAL TUNNEL RELEASE     right   COLON RESECTION  12/11/2012   Procedure: LAPAROSCOPIC RIGHT COLON RESECTION;  Surgeon: Bernarda Ned, MD;  Location: [facility];  Service: General;  Laterality: Right;   COLON SURGERY  12/11/2012  KNEE ARTHROSCOPY     KNEE SURGERY Right    LIVER ULTRASOUND  12/11/2012   Procedure: LIVER ULTRASOUND;  Surgeon: Bernarda Ned, MD;  Location: [facility];  Service: General;  Laterality: Right;  Intraoperative Liver Ultrasound       Home Medications    Prior to Admission medications  Medication Sig Start Date End Date Taking? Authorizing Provider  Guaifenesin  1200 MG TB12 Take 1 tablet (1,200 mg total) by mouth 2 (two) times daily as needed (Congestion). 11/24/24  Yes Leatrice Vernell HERO, NP  acetaminophen  (TYLENOL ) 500 MG tablet Take  500 mg by mouth every 6 (six) hours as needed.    [provider]  allopurinol  (ZYLOPRIM ) 100 MG tablet Take 100 mg by mouth daily. 09/29/21   [provider]  amLODipine  (NORVASC ) 10 MG tablet Take 1 tablet (10 mg total) by mouth daily with lunch. 03/18/22   Sebastian Toribio GAILS, MD  apixaban  (ELIQUIS ) 5 MG TABS tablet Take 1 tablet (5 mg total) by mouth 2 (two) times daily. Take 1 tablet (5 mg total) by mouth 2 (two) times daily. 05/27/24   Patwardhan, Newman PARAS, MD  atorvastatin  (LIPITOR) 40 MG tablet Take 1 tablet (40 mg total) by mouth daily. 04/11/23   Patwardhan, Newman PARAS, MD  bisacodyl  (DULCOLAX) 5 MG EC tablet Take 10 mg by mouth daily as needed for moderate constipation.    [provider]  busPIRone (BUSPAR) 10 MG tablet Take by mouth. 11/07/23   [provider]  clonazePAM (KLONOPIN) 0.5 MG tablet Take 0.5 mg by mouth at bedtime. 01/07/24   [provider]  diclofenac Sodium (VOLTAREN) 1 % GEL Apply 1 Application topically 4 (four) times daily as needed (pain).    [provider]  docusate sodium  (COLACE) 100 MG capsule Take 1 capsule (100 mg total) by mouth 2 (two) times daily. 08/04/22   Bergman, Meghan D, NP  doxazosin (CARDURA) 1 MG tablet Take 1 tablet by mouth daily. 01/07/24   [provider]  empagliflozin (JARDIANCE) 25 MG TABS tablet Take 12.5 mg by mouth daily. 01/30/23   [provider]  empagliflozin (JARDIANCE) 25 MG TABS tablet Take 25 mg by mouth daily. 11/14/23   [provider]  escitalopram (LEXAPRO) 5 MG tablet Take 1 tablet by mouth daily. 01/07/24   [provider]  esomeprazole (NEXIUM) 20 MG capsule Take 20 mg by mouth daily as needed (acid reflux).    [provider]  fluticasone  (FLONASE ) 50 MCG/ACT nasal spray Place 2 sprays into both nostrils daily as needed for allergies. 03/28/22   [provider]  gabapentin  (NEURONTIN ) 100 MG capsule Take 100 mg by mouth 3 (three)  times daily as needed. 12/19/23   [provider]  gabapentin  (NEURONTIN ) 300 MG capsule Take 1 capsule (300 mg total) by mouth 4 (four) times daily. Patient not taking: Reported on 10/14/2024 12/13/23 10/14/24  Joesph Shaver Scales, PA-C  hydrocortisone (ANUSOL-HC) 2.5 % rectal cream Place 1 Application rectally 2 (two) times daily as needed. 11/14/23   [provider]  lubiprostone (AMITIZA) 24 MCG capsule Take 24 mcg by mouth as needed. Patient not taking: Reported on 10/14/2024 01/03/24   [provider]  metoprolol  succinate (TOPROL -XL) 50 MG 24 hr tablet Take 1 tablet (50 mg total) by mouth daily. 04/11/23   Patwardhan, Newman PARAS, MD  ONE TOUCH ULTRA TEST test strip  12/31/18   [provider]    Family History Family History  Problem Relation Age of  Onset   Diabetes Mother    Stroke Mother    Huntington's disease Father    Hypertension Brother     Social History Social History[1]   Allergies   Metformin   Review of Systems Review of Systems  Constitutional: Negative.   HENT:  Positive for congestion, sinus pressure and sinus pain. Negative for ear discharge, ear pain, postnasal drip, rhinorrhea, sneezing and sore throat.   Respiratory:  Positive for cough. Negative for shortness of breath and wheezing.   Musculoskeletal:  Positive for myalgias. Negative for gait problem.  Neurological:  Positive for headaches.     Physical Exam Triage Vital Signs ED Triage Vitals [11/24/24 1216]  Encounter Vitals Group     BP 121/82     Girls Systolic BP Percentile      Girls Diastolic BP Percentile      Boys Systolic BP Percentile      Boys Diastolic BP Percentile      Pulse Rate 68     Resp 16     Temp 98 F (36.7 C)     Temp Source Oral     SpO2 93 %     Weight      Height      Head Circumference      Peak Flow      Pain Score 8     Pain Loc      Pain Education      Exclude from Growth Chart    No data found.  Updated Vital  Signs BP 121/82 (BP Location: Right Arm)   Pulse 68   Temp 98 F (36.7 C) (Oral)   Resp 16   SpO2 93%   Visual Acuity Right Eye Distance:   Left Eye Distance:   Bilateral Distance:    Right Eye Near:   Left Eye Near:    Bilateral Near:     Physical Exam Vitals and nursing note reviewed.  Constitutional:      General: He is not in acute distress.    Appearance: Normal appearance. He is normal weight. He is not toxic-appearing.  HENT:     Head: Normocephalic.     Right Ear: Ear canal and external ear normal. A middle ear effusion is present. There is no impacted cerumen. No mastoid tenderness. No hemotympanum. Tympanic membrane is bulging. Tympanic membrane is not injected or erythematous.     Left Ear: Ear canal and external ear normal. A middle ear effusion is present. There is no impacted cerumen. No mastoid tenderness. No hemotympanum. Tympanic membrane is bulging. Tympanic membrane is not injected or erythematous.     Ears:     Comments: Serous fluid present behind bilat TM's with mild bulging     Nose: No congestion or rhinorrhea.     Right Sinus: No maxillary sinus tenderness or frontal sinus tenderness.     Left Sinus: Maxillary sinus tenderness present. No frontal sinus tenderness.     Mouth/Throat:     Mouth: Mucous membranes are moist.     Pharynx: Oropharynx is clear. Posterior oropharyngeal erythema and postnasal drip present. No oropharyngeal exudate.     Tonsils: No tonsillar exudate or tonsillar abscesses. 1+ on the right. 1+ on the left.     Comments: Mild oropharynx erythema with posterior injection and post nasal drip  Eyes:     Conjunctiva/sclera: Conjunctivae normal.  Cardiovascular:     Rate and Rhythm: Normal rate and regular rhythm.     Heart sounds: Normal heart  sounds.  Pulmonary:     Effort: Pulmonary effort is normal.     Breath sounds: Normal breath sounds.  Musculoskeletal:     Cervical back: Neck supple.  Lymphadenopathy:     Cervical: No  cervical adenopathy.  Skin:    General: Skin is warm and dry.  Neurological:     Mental Status: He is alert and oriented to person, place, and time.  Psychiatric:        Mood and Affect: Mood normal.        Behavior: Behavior normal.      UC Treatments / Results  Labs (all labs ordered are listed, but only abnormal results are displayed) Labs Reviewed - No data to display  EKG   Radiology No results found.  Procedures Procedures (including critical care time)  Medications Ordered in UC Medications - No data to display  Initial Impression / Assessment and Plan / UC Course  I have reviewed the triage vital signs and the nursing notes.  Pertinent labs & imaging results that were available during my care of the patient were reviewed by me and considered in my medical decision making (see chart for details).     HPI and exam support the diagnosis of nasopharyngitis.  Patient's most bothersome symptom is the left-sided sinus pressure which we will manage with Mucinex  1200 mg twice daily.  He is encouraged to continue the Flonase  as well as steam showers.  Left maxillary sinus tenderness to palpation, left patient does not meet criteria for antibiotic intervention at this time due to symptom duration of 3 days.  Encouraged supportive management and emphasized importance of sinus drainage.  He is aware that as his sinuses drain he will develop more of a productive cough, and this is to be expected. Final Clinical Impressions(s) / UC Diagnoses   Final diagnoses:  Nasopharyngitis     Discharge Instructions      Here is a more detailed, patient-friendly AVS while keeping it easy to read:  Nasopharyngitis (Common Cold) You have a viral infection of the nose and throat. This is commonly called a cold. Antibiotics do not treat viral infections. Symptoms often peak over the first few days and then gradually improve over 7-10 days. Medications Guaifenesin  ER (Mucinex  12-hour):  Take as directed to help thin and loosen mucus so it is easier to clear congestion. Drink plenty of water while taking this medication. Flonase  (fluticasone ): Use once daily as directed. This helps decrease nasal swelling, congestion, post-nasal drip, and sinus pressure. It may take a few days of daily use to notice full benefit. Supportive Care Steam showers or warm mist: Help loosen nasal and chest congestion. Fluids: Water and warm drinks help thin mucus and prevent dehydration. Rest: Allows your body to fight the infection more effectively. Humidifier (cool mist): Can help reduce throat irritation and nighttime cough. What to Expect Nasal congestion, runny nose, sore throat, cough, and fatigue are common. Cough and congestion may last longer than other symptoms but should slowly improve. Call or return for care if you develop: Fever lasting more than 3 days or higher than 101F Worsening sinus pain, facial pressure, or thick yellow/green drainage lasting more than 10 days Shortness of breath, chest pain, or wheezing Symptoms that are not improving or are getting worse after 7-10 days     ED Prescriptions     Medication Sig Dispense Auth. Provider   Guaifenesin  1200 MG TB12 Take 1 tablet (1,200 mg total) by mouth 2 (two) times daily  as needed (Congestion). 14 tablet Leatrice Vernell HERO, NP      PDMP not reviewed this encounter.    [1]  Social History Tobacco Use   Smoking status: Every Day    Current packs/day: 0.50    Average packs/day: 0.5 packs/day for 30.0 years (15.0 ttl pk-yrs)    Types: Cigarettes   Smokeless tobacco: Never  Vaping Use   Vaping status: Never Used  Substance Use Topics   Alcohol use: Not Currently    Alcohol/week: 5.0 standard drinks of alcohol    Types: 5 Cans of beer per week   Drug use: No     Leatrice Vernell HERO, NP 11/24/24 1245  "

## 2024-11-24 NOTE — Discharge Instructions (Addendum)
 Here is a more detailed, patient-friendly AVS while keeping it easy to read:  Nasopharyngitis (Common Cold) You have a viral infection of the nose and throat. This is commonly called a cold. Antibiotics do not treat viral infections. Symptoms often peak over the first few days and then gradually improve over 7-10 days. Medications Guaifenesin  ER (Mucinex  12-hour): Take as directed to help thin and loosen mucus so it is easier to clear congestion. Drink plenty of water while taking this medication. Flonase  (fluticasone ): Use once daily as directed. This helps decrease nasal swelling, congestion, post-nasal drip, and sinus pressure. It may take a few days of daily use to notice full benefit. Supportive Care Steam showers or warm mist: Help loosen nasal and chest congestion. Fluids: Water and warm drinks help thin mucus and prevent dehydration. Rest: Allows your body to fight the infection more effectively. Humidifier (cool mist): Can help reduce throat irritation and nighttime cough. What to Expect Nasal congestion, runny nose, sore throat, cough, and fatigue are common. Cough and congestion may last longer than other symptoms but should slowly improve. Call or return for care if you develop: Fever lasting more than 3 days or higher than 101F Worsening sinus pain, facial pressure, or thick yellow/green drainage lasting more than 10 days Shortness of breath, chest pain, or wheezing Symptoms that are not improving or are getting worse after 7-10 days

## 2024-12-15 ENCOUNTER — Telehealth: Payer: Self-pay | Admitting: Cardiology

## 2024-12-15 NOTE — Telephone Encounter (Signed)
" °*  STAT* If patient is at the pharmacy, call can be transferred to refill team.   1. Which medications need to be refilled? (please list name of each medication and dose if known)   apixaban  (ELIQUIS ) 5 MG TABS tablet    2. Which pharmacy/location (including street and city if local pharmacy) is medication to be sent to?  [facility] - [place], [place] - [address]    3. Do they need a 30 day or 90 day supply? 90 days   Patient states his out of medication on his last two. Please Advise  "

## 2024-12-16 ENCOUNTER — Other Ambulatory Visit: Payer: Self-pay

## 2024-12-16 MED ORDER — APIXABAN 5 MG PO TABS: 5.0000 mg | ORAL_TABLET | Freq: Two times a day (BID) | ORAL | 5 refills | Status: AC

## 2024-12-16 NOTE — Telephone Encounter (Signed)
 Pt's medication was sent to pt's pharmacy as requested. Confirmation received.

## 2024-12-17 NOTE — Telephone Encounter (Signed)
 Refill sent 12/16/24.

## 2024-12-24 ENCOUNTER — Telehealth: Payer: Self-pay | Admitting: Cardiology

## 2024-12-24 NOTE — Telephone Encounter (Signed)
 Pt c/o medication issue:  1. Name of Medication:   apixaban  (ELIQUIS ) 5 MG TABS tablet   2. How are you currently taking this medication (dosage and times per day)?   3. Are you having a reaction (difficulty breathing--STAT)?   4. What is your medication issue?   Patient stated he has been off this medication for 6 days and wants to know if this will be OK.

## 2024-12-24 NOTE — Telephone Encounter (Signed)
 Left message to return call

## 2024-12-25 NOTE — Telephone Encounter (Signed)
 I offered fir him to come and pick up samples, but he states that he can not at this time.

## 2024-12-25 NOTE — Telephone Encounter (Signed)
 Sorry for the situation.  Please have him pick up at the earliest possible.  Thanks MJP

## 2024-12-25 NOTE — Telephone Encounter (Signed)
 Spoke to patient and he advises that he had been calling for refills to be sent to the [facility], but the refill team did not send prescription in time. He has been out of Eliquis  for 7 days. He is unable to pick up samples and it can be 7 to 10 days before he can receive his prescription. Pt reports that he can not afford a temporary dose of eliquis  at this time.

## 2024-12-25 NOTE — Telephone Encounter (Signed)
 Sorry to hear that. Anything our pharmacy can help with in terms of a free trial?  Thanks MJP

## 2024-12-25 NOTE — Telephone Encounter (Signed)
 Followed up with patient and pt reports that his eliquis  came in the mail today.

## 2024-12-26 NOTE — Telephone Encounter (Signed)
 Excellent.  Thank you for the follow-up.  Thanks MJP

## 2025-03-30 ENCOUNTER — Ambulatory Visit: Admitting: Neurology
